# Patient Record
Sex: Male | Born: 1935 | Race: White | Hispanic: No | Smoking: Former smoker
Health system: Southern US, Community
[De-identification: ages and names within clinical notes are randomized; demographics above are authoritative.]

## PROBLEM LIST (undated history)

## (undated) DIAGNOSIS — G20 Parkinson's disease: Secondary | ICD-10-CM

## (undated) DIAGNOSIS — I2510 Atherosclerotic heart disease of native coronary artery without angina pectoris: Secondary | ICD-10-CM

## (undated) DIAGNOSIS — F0390 Unspecified dementia without behavioral disturbance: Secondary | ICD-10-CM

## (undated) DIAGNOSIS — G20A1 Parkinson's disease without dyskinesia, without mention of fluctuations: Secondary | ICD-10-CM

## (undated) DIAGNOSIS — Z955 Presence of coronary angioplasty implant and graft: Secondary | ICD-10-CM

## (undated) DIAGNOSIS — E119 Type 2 diabetes mellitus without complications: Secondary | ICD-10-CM

## (undated) DIAGNOSIS — F419 Anxiety disorder, unspecified: Secondary | ICD-10-CM

## (undated) DIAGNOSIS — C61 Malignant neoplasm of prostate: Secondary | ICD-10-CM

## (undated) DIAGNOSIS — I10 Essential (primary) hypertension: Secondary | ICD-10-CM

## (undated) DIAGNOSIS — E785 Hyperlipidemia, unspecified: Secondary | ICD-10-CM

## (undated) DIAGNOSIS — K219 Gastro-esophageal reflux disease without esophagitis: Secondary | ICD-10-CM

## (undated) HISTORY — PX: TRANSURETHRAL RESECTION OF PROSTATE: SHX73

## (undated) HISTORY — PX: APPENDECTOMY: SHX54

## (undated) HISTORY — PX: EYE SURGERY: SHX253

## (undated) HISTORY — PX: CORONARY STENT PLACEMENT: SHX1402

---

## 2011-02-15 DIAGNOSIS — C61 Malignant neoplasm of prostate: Secondary | ICD-10-CM | POA: Insufficient documentation

## 2016-10-02 ENCOUNTER — Emergency Department: Payer: Medicare Other

## 2016-10-02 ENCOUNTER — Encounter: Payer: Self-pay | Admitting: Emergency Medicine

## 2016-10-02 ENCOUNTER — Inpatient Hospital Stay
Admission: EM | Admit: 2016-10-02 | Discharge: 2016-10-08 | DRG: 871 | Disposition: A | Discharge status: Skilled Nursing Facility | Payer: Medicare Other | Attending: Internal Medicine | Admitting: Internal Medicine

## 2016-10-02 DIAGNOSIS — J189 Pneumonia, unspecified organism: Secondary | ICD-10-CM | POA: Diagnosis present

## 2016-10-02 DIAGNOSIS — R32 Unspecified urinary incontinence: Secondary | ICD-10-CM | POA: Diagnosis present

## 2016-10-02 DIAGNOSIS — Z794 Long term (current) use of insulin: Secondary | ICD-10-CM | POA: Diagnosis not present

## 2016-10-02 DIAGNOSIS — N400 Enlarged prostate without lower urinary tract symptoms: Secondary | ICD-10-CM | POA: Diagnosis present

## 2016-10-02 DIAGNOSIS — I4891 Unspecified atrial fibrillation: Secondary | ICD-10-CM | POA: Diagnosis not present

## 2016-10-02 DIAGNOSIS — G20 Parkinson's disease: Secondary | ICD-10-CM | POA: Diagnosis present

## 2016-10-02 DIAGNOSIS — E119 Type 2 diabetes mellitus without complications: Secondary | ICD-10-CM | POA: Diagnosis present

## 2016-10-02 DIAGNOSIS — L8991 Pressure ulcer of unspecified site, stage 1: Secondary | ICD-10-CM | POA: Diagnosis present

## 2016-10-02 DIAGNOSIS — R52 Pain, unspecified: Secondary | ICD-10-CM

## 2016-10-02 DIAGNOSIS — E785 Hyperlipidemia, unspecified: Secondary | ICD-10-CM | POA: Diagnosis present

## 2016-10-02 DIAGNOSIS — K219 Gastro-esophageal reflux disease without esophagitis: Secondary | ICD-10-CM | POA: Diagnosis present

## 2016-10-02 DIAGNOSIS — R451 Restlessness and agitation: Secondary | ICD-10-CM

## 2016-10-02 DIAGNOSIS — B370 Candidal stomatitis: Secondary | ICD-10-CM | POA: Diagnosis present

## 2016-10-02 DIAGNOSIS — Z79899 Other long term (current) drug therapy: Secondary | ICD-10-CM | POA: Diagnosis not present

## 2016-10-02 DIAGNOSIS — Z955 Presence of coronary angioplasty implant and graft: Secondary | ICD-10-CM

## 2016-10-02 DIAGNOSIS — L8990 Pressure ulcer of unspecified site, unspecified stage: Secondary | ICD-10-CM | POA: Insufficient documentation

## 2016-10-02 DIAGNOSIS — M542 Cervicalgia: Secondary | ICD-10-CM | POA: Diagnosis present

## 2016-10-02 DIAGNOSIS — Z9181 History of falling: Secondary | ICD-10-CM

## 2016-10-02 DIAGNOSIS — I2510 Atherosclerotic heart disease of native coronary artery without angina pectoris: Secondary | ICD-10-CM | POA: Diagnosis present

## 2016-10-02 DIAGNOSIS — J9600 Acute respiratory failure, unspecified whether with hypoxia or hypercapnia: Secondary | ICD-10-CM | POA: Diagnosis present

## 2016-10-02 DIAGNOSIS — Z809 Family history of malignant neoplasm, unspecified: Secondary | ICD-10-CM

## 2016-10-02 DIAGNOSIS — R296 Repeated falls: Secondary | ICD-10-CM | POA: Diagnosis present

## 2016-10-02 DIAGNOSIS — A419 Sepsis, unspecified organism: Principal | ICD-10-CM | POA: Diagnosis present

## 2016-10-02 DIAGNOSIS — F419 Anxiety disorder, unspecified: Secondary | ICD-10-CM | POA: Diagnosis present

## 2016-10-02 DIAGNOSIS — Z8249 Family history of ischemic heart disease and other diseases of the circulatory system: Secondary | ICD-10-CM

## 2016-10-02 DIAGNOSIS — F0281 Dementia in other diseases classified elsewhere with behavioral disturbance: Secondary | ICD-10-CM | POA: Diagnosis present

## 2016-10-02 DIAGNOSIS — R531 Weakness: Secondary | ICD-10-CM | POA: Diagnosis present

## 2016-10-02 DIAGNOSIS — M722 Plantar fascial fibromatosis: Secondary | ICD-10-CM | POA: Diagnosis present

## 2016-10-02 DIAGNOSIS — I10 Essential (primary) hypertension: Secondary | ICD-10-CM | POA: Diagnosis present

## 2016-10-02 HISTORY — DX: Essential (primary) hypertension: I10

## 2016-10-02 HISTORY — DX: Parkinson's disease without dyskinesia, without mention of fluctuations: G20.A1

## 2016-10-02 HISTORY — DX: Type 2 diabetes mellitus without complications: E11.9

## 2016-10-02 HISTORY — DX: Atherosclerotic heart disease of native coronary artery without angina pectoris: I25.10

## 2016-10-02 HISTORY — DX: Parkinson's disease: G20

## 2016-10-02 HISTORY — DX: Presence of coronary angioplasty implant and graft: Z95.5

## 2016-10-02 HISTORY — DX: Hyperlipidemia, unspecified: E78.5

## 2016-10-02 HISTORY — DX: Unspecified dementia, unspecified severity, without behavioral disturbance, psychotic disturbance, mood disturbance, and anxiety: F03.90

## 2016-10-02 HISTORY — DX: Gastro-esophageal reflux disease without esophagitis: K21.9

## 2016-10-02 HISTORY — DX: Anxiety disorder, unspecified: F41.9

## 2016-10-02 LAB — URINALYSIS, COMPLETE (UACMP) WITH MICROSCOPIC
BILIRUBIN URINE: NEGATIVE
Bacteria, UA: NONE SEEN
Ketones, ur: NEGATIVE mg/dL
Leukocytes, UA: NEGATIVE
NITRITE: NEGATIVE
PROTEIN: 100 mg/dL — AB
SPECIFIC GRAVITY, URINE: 1.024 (ref 1.005–1.030)
Squamous Epithelial / LPF: NONE SEEN
pH: 8 (ref 5.0–8.0)

## 2016-10-02 LAB — COMPREHENSIVE METABOLIC PANEL
ALT: 16 U/L — ABNORMAL LOW (ref 17–63)
AST: 41 U/L (ref 15–41)
Albumin: 3.6 g/dL (ref 3.5–5.0)
Alkaline Phosphatase: 100 U/L (ref 38–126)
Anion gap: 9 (ref 5–15)
BUN: 16 mg/dL (ref 6–20)
CO2: 29 mmol/L (ref 22–32)
Calcium: 9.2 mg/dL (ref 8.9–10.3)
Chloride: 99 mmol/L — ABNORMAL LOW (ref 101–111)
Creatinine, Ser: 1.12 mg/dL (ref 0.61–1.24)
GFR calc Af Amer: >60 mL/min (ref >60)
GFR calc non Af Amer: 60 mL/min — ABNORMAL LOW (ref >60)
Glucose, Bld: 346 mg/dL — ABNORMAL HIGH (ref 65–99)
Potassium: 3.8 mmol/L (ref 3.5–5.1)
Sodium: 137 mmol/L (ref 135–145)
Total Bilirubin: 0.8 mg/dL (ref 0.3–1.2)
Total Protein: 7.5 g/dL (ref 6.5–8.1)

## 2016-10-02 LAB — CBC WITH DIFFERENTIAL/PLATELET
BASOS PCT: 0 %
Basophils Absolute: 0 10*3/uL (ref 0–0.1)
EOS ABS: 0 10*3/uL (ref 0–0.7)
Eosinophils Relative: 0 %
HEMATOCRIT: 39.9 % — AB (ref 40.0–52.0)
HEMOGLOBIN: 13.5 g/dL (ref 13.0–18.0)
Lymphocytes Relative: 4 %
Lymphs Abs: 0.8 10*3/uL — ABNORMAL LOW (ref 1.0–3.6)
MCH: 28.7 pg (ref 26.0–34.0)
MCHC: 33.8 g/dL (ref 32.0–36.0)
MCV: 84.8 fL (ref 80.0–100.0)
Monocytes Absolute: 0.6 10*3/uL (ref 0.2–1.0)
Monocytes Relative: 3 %
NEUTROS ABS: 15.8 10*3/uL — AB (ref 1.4–6.5)
NEUTROS PCT: 93 %
Platelets: 245 10*3/uL (ref 150–440)
RBC: 4.7 MIL/uL (ref 4.40–5.90)
RDW: 15.2 % — ABNORMAL HIGH (ref 11.5–14.5)
WBC: 17.2 10*3/uL — AB (ref 3.8–10.6)

## 2016-10-02 LAB — INFLUENZA PANEL BY PCR (TYPE A & B)
INFLAPCR: NEGATIVE
Influenza B By PCR: NEGATIVE

## 2016-10-02 LAB — LACTIC ACID, PLASMA: Lactic Acid, Venous: 1.9 mmol/L (ref 0.5–1.9)

## 2016-10-02 MED ORDER — ONDANSETRON HCL 4 MG/2ML IJ SOLN: 4.0000 mg | Freq: Four times a day (QID) | INTRAMUSCULAR | Status: DC | PRN

## 2016-10-02 MED ORDER — SODIUM CHLORIDE 0.9 % IV BOLUS (SEPSIS)
1000.0000 mL | Freq: Once | INTRAVENOUS | Status: AC
  Administered 2016-10-02: 1000 mL via INTRAVENOUS

## 2016-10-02 MED ORDER — DEXTROSE 5 % IV SOLN
1.0000 g | INTRAVENOUS | Status: DC
  Administered 2016-10-03 – 2016-10-07 (×5): 1 g via INTRAVENOUS
  Filled 2016-10-02 (×6): qty 10

## 2016-10-02 MED ORDER — IPRATROPIUM-ALBUTEROL 0.5-2.5 (3) MG/3ML IN SOLN: 3.0000 mL | RESPIRATORY_TRACT | Status: DC | PRN

## 2016-10-02 MED ORDER — ACETAMINOPHEN 500 MG PO TABS
1000.0000 mg | ORAL_TABLET | Freq: Once | ORAL | Status: AC
  Administered 2016-10-02: 1000 mg via ORAL
  Filled 2016-10-02: qty 2

## 2016-10-02 MED ORDER — PANTOPRAZOLE SODIUM 40 MG PO TBEC
40.0000 mg | DELAYED_RELEASE_TABLET | Freq: Every day | ORAL | Status: DC
Start: 2016-10-03 — End: 2016-10-08
  Administered 2016-10-03 – 2016-10-08 (×6): 40 mg via ORAL
  Filled 2016-10-02 (×6): qty 1

## 2016-10-02 MED ORDER — BENZONATATE 100 MG PO CAPS: 200.0000 mg | ORAL_CAPSULE | Freq: Three times a day (TID) | ORAL | Status: DC | PRN

## 2016-10-02 MED ORDER — AMLODIPINE BESYLATE 5 MG PO TABS
5.0000 mg | ORAL_TABLET | Freq: Every day | ORAL | Status: DC
  Administered 2016-10-03 – 2016-10-07 (×5): 5 mg via ORAL
  Filled 2016-10-02 (×5): qty 1

## 2016-10-02 MED ORDER — CARBIDOPA-LEVODOPA 25-100 MG PO TABS
1.0000 | ORAL_TABLET | Freq: Three times a day (TID) | ORAL | Status: DC
  Administered 2016-10-03 – 2016-10-08 (×17): 1 via ORAL
  Filled 2016-10-02 (×17): qty 1

## 2016-10-02 MED ORDER — TAMSULOSIN HCL 0.4 MG PO CAPS
0.4000 mg | ORAL_CAPSULE | Freq: Every day | ORAL | Status: DC
  Administered 2016-10-03 – 2016-10-08 (×6): 0.4 mg via ORAL
  Filled 2016-10-02 (×6): qty 1

## 2016-10-02 MED ORDER — ONDANSETRON HCL 4 MG PO TABS: 4.0000 mg | ORAL_TABLET | Freq: Four times a day (QID) | ORAL | Status: DC | PRN

## 2016-10-02 MED ORDER — DEXTROSE 5 % IV SOLN
500.0000 mg | Freq: Once | INTRAVENOUS | Status: AC
  Administered 2016-10-02: 500 mg via INTRAVENOUS
  Filled 2016-10-02: qty 500

## 2016-10-02 MED ORDER — ENOXAPARIN SODIUM 40 MG/0.4ML ~~LOC~~ SOLN
40.0000 mg | SUBCUTANEOUS | Status: DC
  Administered 2016-10-03 – 2016-10-07 (×5): 40 mg via SUBCUTANEOUS
  Filled 2016-10-02 (×5): qty 0.4

## 2016-10-02 MED ORDER — DEXTROSE 5 % IV SOLN
500.0000 mg | INTRAVENOUS | Status: DC
  Administered 2016-10-03 – 2016-10-07 (×5): 500 mg via INTRAVENOUS
  Filled 2016-10-02 (×7): qty 500

## 2016-10-02 MED ORDER — ACETAMINOPHEN 325 MG PO TABS
650.0000 mg | ORAL_TABLET | Freq: Four times a day (QID) | ORAL | Status: DC | PRN
  Administered 2016-10-04 – 2016-10-07 (×3): 650 mg via ORAL
  Filled 2016-10-02 (×3): qty 2

## 2016-10-02 MED ORDER — CEFTRIAXONE SODIUM-DEXTROSE 1-3.74 GM-% IV SOLR
1.0000 g | Freq: Once | INTRAVENOUS | Status: AC
  Administered 2016-10-02: 1 g via INTRAVENOUS
  Filled 2016-10-02: qty 50

## 2016-10-02 MED ORDER — ACETAMINOPHEN 650 MG RE SUPP: 650.0000 mg | Freq: Four times a day (QID) | RECTAL | Status: DC | PRN

## 2016-10-02 NOTE — ED Provider Notes (Signed)
[facility] Emergency Department Provider Note  ____________________________________________  Time seen: Approximately 7:16 PM  I have reviewed the triage vital signs and the nursing notes.   HISTORY  Chief Complaint Altered Mental Status  Level 5 caveat:  Portions of the history and physical were unable to be obtained due to AMS   HPI Dennis Serrano is a 83 y.o. male history of Parkinson's disease, dementia, diabetes who presents for evaluation of altered mental status. According to the family patient is usually independent, walks and talks at baseline. Over the course of the last few days patient has been sleeping most of the day, decreased appetite, more confused than his baseline. Today he has become incontinent of urine. Patient tells me that he has had a cough but denies shortness of breath, chest pain, nausea, vomiting, diarrhea, abdominal pain.  Past Medical History:  Diagnosis Date  . Dementia   . Diabetes mellitus without complication ([place])   . History of heart artery stent   . Parkinson disease ([place])     There are no active problems to display for this patient.   History reviewed. No pertinent surgical history.  Prior to Admission medications   Medication Sig Start Date End Date Taking? Authorizing Provider  AmLODIPine Besylate (NORVASC PO) Take 1 tablet by mouth daily.   Yes Historical Provider, MD  carbidopa-levodopa (SINEMET IR) 25-100 MG tablet Take 1 tablet by mouth 3 (three) times daily.   Yes Historical Provider, MD  Cyclobenzaprine HCl (FLEXERIL PO) Take 1 tablet by mouth 3 (three) times daily as needed.   Yes Historical Provider, MD  GABAPENTIN PO Take 1 capsule by mouth.   Yes Historical Provider, MD  METFORMIN HCL PO Take 1 tablet by mouth.   Yes Historical Provider, MD  Omeprazole (PRILOSEC PO) Take by mouth.   Yes Historical Provider, MD  ondansetron (ZOFRAN) 4 MG tablet Take 4 mg by mouth every 8 (eight) hours as needed for  nausea or vomiting.   Yes Historical Provider, MD  SPIRONOLACTONE PO Take 1 tablet by mouth.   Yes Historical Provider, MD  Tamsulosin HCl (FLOMAX PO) Take 1 capsule by mouth daily.   Yes Historical Provider, MD    Allergies Patient has no known allergies.  History reviewed. No pertinent family history.  Social History Social History  Substance Use Topics  . Smoking status: Never Smoker  . Smokeless tobacco: Never Used  . Alcohol use No    Review of Systems  Constitutional: + fever, confused, somnolent Eyes: Negative for visual changes. ENT: Negative for sore throat. Neck: No neck pain  Cardiovascular: Negative for chest pain. Respiratory: Negative for shortness of breath. Gastrointestinal: Negative for abdominal pain, vomiting or diarrhea. + decreased Po intake Genitourinary: Negative for dysuria. + urinary incontinence Musculoskeletal: Negative for back pain. Skin: Negative for rash. Neurological: Negative for headaches, weakness or numbness. Psych: No SI or HI  ____________________________________________   PHYSICAL EXAM:  VITAL SIGNS: ED Triage Vitals  Enc Vitals Group     BP 10/02/16 1907 (!) 154/76     Pulse Rate 10/02/16 1907 (!) 110     Resp 10/02/16 1907 20     Temp 10/02/16 1907 (!) 102 F (38.9 C)     Temp Source 10/02/16 1907 Rectal     SpO2 10/02/16 1907 90 %     Weight 10/02/16 1908 152 lb 5.4 oz (69.1 kg)     Height 10/02/16 1908 5\' 7"  (1.702 m)     Head Circumference --  Peak Flow --      Pain Score --      Pain Loc --      Pain Edu? --      Excl. in [place]? --     Constitutional: Alert and oriented to self only, no distress, patient with soiled clothing, smelling strong urine, disheveled. HEENT:      Head: Normocephalic and atraumatic.         Eyes: Conjunctivae are normal. Sclera is non-icteric. EOMI. PERRL      Mouth/Throat: Mucous membranes are moist.       Neck: Supple with no signs of meningismus. Cardiovascular: Tachycardic with  regular rhythm. No murmurs, gallops, or rubs. 2+ symmetrical distal pulses are present in all extremities. No JVD. Respiratory: Normal respiratory effort. Lungs are clear to auscultation bilaterally. No wheezes, crackles, or rhonchi.  Gastrointestinal: Soft, non tender, and non distended with positive bowel sounds. No rebound or guarding. Musculoskeletal: Nontender with normal range of motion in all extremities. No edema, cyanosis, or erythema of extremities. Neurologic: Normal speech and language. Face is symmetric. Moving all extremities. No gross focal neurologic deficits are appreciated. Skin: Skin is warm, dry and intact. No rash noted.   ____________________________________________   LABS (all labs ordered are listed, but only abnormal results are displayed)  Labs Reviewed  COMPREHENSIVE METABOLIC PANEL - Abnormal; Notable for the following:       Result Value   Chloride 99 (*)    Glucose, Bld 346 (*)    ALT 16 (*)    GFR calc non Af Amer 60 (*)    All other components within normal limits  CBC WITH DIFFERENTIAL/PLATELET - Abnormal; Notable for the following:    WBC 17.2 (*)    HCT 39.9 (*)    RDW 15.2 (*)    Neutro Abs 15.8 (*)    Lymphs Abs 0.8 (*)    All other components within normal limits  URINALYSIS, COMPLETE (UACMP) WITH MICROSCOPIC - Abnormal; Notable for the following:    Color, Urine YELLOW (*)    APPearance CLEAR (*)    Glucose, UA >=500 (*)    Hgb urine dipstick SMALL (*)    Protein, ur 100 (*)    All other components within normal limits  CULTURE, BLOOD (ROUTINE X 2)  CULTURE, BLOOD (ROUTINE X 2)  URINE CULTURE  LACTIC ACID, PLASMA  LACTIC ACID, PLASMA  INFLUENZA PANEL BY PCR (TYPE A & B)   ____________________________________________  EKG  ED ECG REPORT I, Rudene Re, the attending physician, personally viewed and interpreted this ECG.  Sinus tachycardia, rate of 109, normal intervals, LAD, no ST elevations or depressions abnormal R-wave  progression. No prior for comparison. ____________________________________________  RADIOLOGY  CXR: Left retrocardiac opacity, suspicious for pneumonia. Recommend post-treatment followup PA and lateral chest X-ray in several weeks to ensure resolution  ____________________________________________   PROCEDURES  Procedure(s) performed: None Procedures Critical Care performed: yes  CRITICAL CARE Performed by: Rudene Re  ?  Total critical care time: 35 min  Critical care time was exclusive of separately billable procedures and treating other patients.  Critical care was necessary to treat or prevent imminent or life-threatening deterioration.  Critical care was time spent personally by me on the following activities: development of treatment plan with patient and/or surrogate as well as nursing, discussions with consultants, evaluation of patient's response to treatment, examination of patient, obtaining history from patient or surrogate, ordering and performing treatments and interventions, ordering and review of laboratory studies, ordering  and review of radiographic studies, pulse oximetry and re-evaluation of patient's condition.  ____________________________________________   INITIAL IMPRESSION / ASSESSMENT AND PLAN / ED COURSE  83 y.o. male history of Parkinson's disease, dementia, diabetes who presents for evaluation of altered mental status. Patient meets sepsis criteria upon arrival to the emergency room with a rectal temperature of 102F, tachycardia with heart rate of 110. Patient is normotensive. No meningeal signs. Patient is disheveled with soiled clothing smelling strong urine. We started an IV fluids and IV antibiotics. We'll do CBC, CMP, lactic acid, blood cultures, flu PCR, urinalysis, CXR.     _________________________ 8:23 PM on 10/02/2016 -----------------------------------------  Chest x-ray concerning for pneumonia. Patient was given ceftriaxone and  azithromycin. Will be admitted to the hospitalist service.  Pertinent labs & imaging results that were available during my care of the patient were reviewed by me and considered in my medical decision making (see chart for details).    ____________________________________________   FINAL CLINICAL IMPRESSION(S) / ED DIAGNOSES  Final diagnoses:  Sepsis, due to unspecified organism [facility])  Community acquired pneumonia, unspecified laterality      NEW MEDICATIONS STARTED DURING THIS VISIT:  New Prescriptions   No medications on file     Note:  This document was prepared using Dragon voice recognition software and may include unintentional dictation errors.    Rudene Re, MD 10/02/16 2023

## 2016-10-02 NOTE — ED Notes (Signed)
Pt's O2 sats noted at 88% on RA, Pt placed on 2L via [place]; will monitor O2 sats

## 2016-10-02 NOTE — ED Triage Notes (Signed)
Pt presents to ED via [facility] from home c/o AMS starting today. Pt's daughter states pt has dementia at baseline but is usually able to converse and able to walk around at home. Daughter states that the last few days all pt has done is sleep, confusion is increased from baseline, and pt has new-onset urinary incontinence. +wet sounding cough

## 2016-10-02 NOTE — Progress Notes (Signed)
Pharmacy Antibiotic Note  Dennis Serrano is a 83 y.o. male admitted on 10/02/2016 with pneumonia.  Pharmacy has been consulted for ceftriaxone dosing.  Plan: Ceftriaxone 1 gram q 24 hours ordered by MD. OK for pt parameters.  Height: 5\' 7"  (170.2 cm) Weight: 152 lb 5.4 oz (69.1 kg) IBW/kg (Calculated) : 66.1  Temp (24hrs), Avg:101.3 F (38.5 C), Min:100.5 F (38.1 C), Max:102 F (38.9 C)   Recent Labs Lab 10/02/16 1914  WBC 17.2*  CREATININE 1.12  LATICACIDVEN 1.9    Estimated Creatinine Clearance: 48.4 mL/min (by C-G formula based on SCr of 1.12 mg/dL).    No Known Allergies  Antimicrobials this admission: ceftriaxone azithromycin 4/8 >>    >>   Dose adjustments this admission:   Microbiology results: 4/8 BCx: pending 4/8 UCx: pending  4/8 Sputum: pending      4/8 UA: (-) Thank you for allowing pharmacy to be a part of this patient's care.  Keona Bilyeu S 10/02/2016 11:34 PM

## 2016-10-02 NOTE — ED Notes (Signed)
Pt transport to 217 

## 2016-10-02 NOTE — ED Notes (Signed)
Pt's O2 Sats noted to be above 94%; MD made aware

## 2016-10-02 NOTE — H&P (Signed)
[facility] [place] NAME: Dennis Serrano    MR#:  [id number]  DATE OF BIRTH:  [birth year]  DATE OF ADMISSION:  10/02/2016  PRIMARY CARE PHYSICIAN: Juline Patch, MD   REQUESTING/REFERRING PHYSICIAN: Alfred Levins, MD  CHIEF COMPLAINT:   Chief Complaint  Patient presents with  . Altered Mental Status    HISTORY OF PRESENT ILLNESS:  Dennis Serrano  is a 83 y.o. male who presents with Increased confusion, cough. Patient is unable to contribute significantly to his history, it is given by family at bedside. Here in the ED the patient was found to have pneumonia and met sepsis criteria. Hospitalists were called for admission.  PAST MEDICAL HISTORY:   Past Medical History:  Diagnosis Date  . Anxiety   . CAD (coronary artery disease)   . Dementia   . Diabetes mellitus without complication ([place])   . GERD (gastroesophageal reflux disease)   . History of heart artery stent   . HLD (hyperlipidemia)   . HTN (hypertension)   . Parkinson disease ([place])     PAST SURGICAL HISTORY:   Past Surgical History:  Procedure Laterality Date  . APPENDECTOMY    . CORONARY STENT PLACEMENT    . EYE SURGERY    . TRANSURETHRAL RESECTION OF PROSTATE      SOCIAL HISTORY:   Social History  Substance Use Topics  . Smoking status: Never Smoker  . Smokeless tobacco: Never Used  . Alcohol use No    FAMILY HISTORY:   Family History  Problem Relation Age of Onset  . Heart disease Mother   . Heart disease Father   . Cancer Sister   . Cancer Brother     DRUG ALLERGIES:  No Known Allergies  MEDICATIONS AT HOME:   Prior to Admission medications   Medication Sig Start Date End Date Taking? Authorizing Provider  AmLODIPine Besylate (NORVASC PO) Take 1 tablet by mouth daily.   Yes Historical Provider, MD  carbidopa-levodopa (SINEMET IR) 25-100 MG tablet Take 1 tablet by mouth 3 (three) times daily.   Yes Historical Provider, MD   Cyclobenzaprine HCl (FLEXERIL PO) Take 1 tablet by mouth 3 (three) times daily as needed.   Yes Historical Provider, MD  GABAPENTIN PO Take 1 capsule by mouth.   Yes Historical Provider, MD  METFORMIN HCL PO Take 1 tablet by mouth.   Yes Historical Provider, MD  Omeprazole (PRILOSEC PO) Take by mouth.   Yes Historical Provider, MD  ondansetron (ZOFRAN) 4 MG tablet Take 4 mg by mouth every 8 (eight) hours as needed for nausea or vomiting.   Yes Historical Provider, MD  SPIRONOLACTONE PO Take 1 tablet by mouth.   Yes Historical Provider, MD  Tamsulosin HCl (FLOMAX PO) Take 1 capsule by mouth daily.   Yes Historical Provider, MD    REVIEW OF SYSTEMS:  Review of Systems  Unable to perform ROS: Acuity of condition     VITAL SIGNS:   Vitals:   10/02/16 2026 10/02/16 2030 10/02/16 2040 10/02/16 2100  BP:  (!) 146/70  (!) 148/68  Pulse:  (!) 111  (!) 102  Resp:      Temp:      TempSrc:      SpO2: (!) 88% 91% 95% 95%  Weight:      Height:       Wt Readings from Last 3 Encounters:  10/02/16 69.1 kg (152 lb 5.4 oz)    PHYSICAL EXAMINATION:  Physical  Exam  Vitals reviewed. Constitutional: He appears well-developed and well-nourished. No distress.  HENT:  Head: Normocephalic and atraumatic.  Mouth/Throat: Oropharynx is clear and moist.  Eyes: Conjunctivae and EOM are normal. Pupils are equal, round, and reactive to light. No scleral icterus.  Neck: Normal range of motion. Neck supple. No JVD present. No thyromegaly present.  Cardiovascular: Normal rate, regular rhythm and intact distal pulses.  Exam reveals no gallop and no friction rub.   No murmur heard. Respiratory: Effort normal. No respiratory distress. He has no wheezes. He has no rales.  Rhonchi  GI: Soft. Bowel sounds are normal. He exhibits no distension. There is no tenderness.  Musculoskeletal: Normal range of motion. He exhibits no edema.  No arthritis, no gout  Lymphadenopathy:    He has no cervical adenopathy.   Neurological: He is alert. No cranial nerve deficit.  Unable to assess due to patient condition  Skin: Skin is warm and dry. No rash noted. No erythema.  Psychiatric:  Unable to assess due to patient condition    LABORATORY PANEL:   CBC  Recent Labs Lab 10/02/16 1914  WBC 17.2*  HGB 13.5  HCT 39.9*  PLT 245   ------------------------------------------------------------------------------------------------------------------  Chemistries   Recent Labs Lab 10/02/16 1914  NA 137  K 3.8  CL 99*  CO2 29  GLUCOSE 346*  BUN 16  CREATININE 1.12  CALCIUM 9.2  AST 41  ALT 16*  ALKPHOS 100  BILITOT 0.8   ------------------------------------------------------------------------------------------------------------------  Cardiac Enzymes No results for input(s): TROPONINI in the last 168 hours. ------------------------------------------------------------------------------------------------------------------  RADIOLOGY:  Dg Chest Port 1 View  Result Date: 10/02/2016 CLINICAL DATA:  Cough and weakness for several days.  Sepsis. EXAM: PORTABLE CHEST 1 VIEW COMPARISON:  None. FINDINGS: The heart size and mediastinal contours are within normal limits. Aortic atherosclerosis. Both lungs are clear. The visualized skeletal structures are unremarkable. Pulmonary opacity is seen in the left retrocardiac lung base, suspicious for pneumonia. Right lung is clear. IMPRESSION: Left retrocardiac opacity, suspicious for pneumonia. Recommend post-treatment followup PA and lateral chest X-ray in several weeks to ensure resolution. Electronically Signed   By: Myles Rosenthal M.D.   On: 10/02/2016 19:50    EKG:   Orders placed or performed during the hospital encounter of 10/02/16  . ED EKG 12-Lead  . ED EKG 12-Lead  . EKG 12-Lead  . EKG 12-Lead    IMPRESSION AND PLAN:  Principal Problem:   Sepsis (HCC) - broad spectrum IV antibiotics, lactic acid was within normal limits, patient is  hemodynamically stable, culture sent from the ED Active Problems:   CAP (community acquired pneumonia) - antibiotics and cultures as above   HTN (hypertension) - continue home meds   CAD (coronary artery disease) - continue home medications   HLD (hyperlipidemia) - home dose anti-lipids   GERD (gastroesophageal reflux disease) - home dose PPI  All the records are reviewed and case discussed with ED provider. Management plans discussed with the patient and/or family.  DVT PROPHYLAXIS: SubQ lovenox  GI PROPHYLAXIS: PPI  ADMISSION STATUS: Inpatient  CODE STATUS: Full Code Status History    This patient does not have a recorded code status. Please follow your organizational policy for patients in this situation.      TOTAL TIME TAKING CARE OF THIS PATIENT: 45 minutes.   Jerzy Roepke FIELDING 10/02/2016, 9:59 PM  [employer] [facility]  Office  [phone]  CC: Primary care physician; Thea Alken, MD  Note:  This document was  prepared using Systems analyst and may include unintentional dictation errors.

## 2016-10-02 NOTE — ED Notes (Signed)
Pt's O2 sats increased to 91% on 2L; O2 increased to 4L; will monitor O2 sats

## 2016-10-03 LAB — GLUCOSE, CAPILLARY
Glucose-Capillary: 274 mg/dL — ABNORMAL HIGH (ref 65–99)
Glucose-Capillary: 233 mg/dL — ABNORMAL HIGH (ref 65–99)

## 2016-10-03 LAB — BASIC METABOLIC PANEL
Anion gap: 10 (ref 5–15)
BUN: 15 mg/dL (ref 6–20)
CO2: 23 mmol/L (ref 22–32)
CREATININE: 0.75 mg/dL (ref 0.61–1.24)
Calcium: 8.7 mg/dL — ABNORMAL LOW (ref 8.9–10.3)
Chloride: 105 mmol/L (ref 101–111)
GFR calc Af Amer: >60 mL/min (ref >60)
GFR calc non Af Amer: >60 mL/min (ref >60)
Glucose, Bld: 377 mg/dL — ABNORMAL HIGH (ref 65–99)
Potassium: 3.8 mmol/L (ref 3.5–5.1)
SODIUM: 138 mmol/L (ref 135–145)

## 2016-10-03 LAB — CBC
HCT: 36.4 % — ABNORMAL LOW (ref 40.0–52.0)
Hemoglobin: 12.2 g/dL — ABNORMAL LOW (ref 13.0–18.0)
MCH: 28.2 pg (ref 26.0–34.0)
MCHC: 33.4 g/dL (ref 32.0–36.0)
MCV: 84.3 fL (ref 80.0–100.0)
PLATELETS: 219 10*3/uL (ref 150–440)
RBC: 4.31 MIL/uL — ABNORMAL LOW (ref 4.40–5.90)
RDW: 15.1 % — AB (ref 11.5–14.5)
WBC: 14.2 10*3/uL — AB (ref 3.8–10.6)

## 2016-10-03 MED ORDER — SODIUM CHLORIDE 0.9 % IV BOLUS (SEPSIS)
500.0000 mL | Freq: Once | INTRAVENOUS | Status: AC
  Administered 2016-10-03: 500 mL via INTRAVENOUS

## 2016-10-03 MED ORDER — INSULIN DETEMIR 100 UNIT/ML ~~LOC~~ SOLN
20.0000 [IU] | Freq: Every day | SUBCUTANEOUS | Status: DC
  Filled 2016-10-03: qty 0.2

## 2016-10-03 MED ORDER — ASPIRIN EC 81 MG PO TBEC
81.0000 mg | DELAYED_RELEASE_TABLET | Freq: Every day | ORAL | Status: DC
  Administered 2016-10-04 – 2016-10-07 (×4): 81 mg via ORAL
  Filled 2016-10-03 (×4): qty 1

## 2016-10-03 MED ORDER — SODIUM CHLORIDE 0.9 % IV SOLN
INTRAVENOUS | Status: DC
  Administered 2016-10-03 – 2016-10-04 (×3): via INTRAVENOUS

## 2016-10-03 MED ORDER — INSULIN NPH (HUMAN) (ISOPHANE) 100 UNIT/ML ~~LOC~~ SUSP: 20.0000 [IU] | Freq: Every day | SUBCUTANEOUS | Status: DC

## 2016-10-03 MED ORDER — INSULIN ASPART 100 UNIT/ML ~~LOC~~ SOLN
0.0000 [IU] | Freq: Every day | SUBCUTANEOUS | Status: DC
Start: 2016-10-03 — End: 2016-10-08
  Administered 2016-10-03 – 2016-10-04 (×2): 2 [IU] via SUBCUTANEOUS
  Administered 2016-10-05: 3 [IU] via SUBCUTANEOUS
  Administered 2016-10-06: 4 [IU] via SUBCUTANEOUS
  Filled 2016-10-03: qty 2
  Filled 2016-10-03: qty 4
  Filled 2016-10-03: qty 3
  Filled 2016-10-03: qty 2

## 2016-10-03 MED ORDER — HALOPERIDOL LACTATE 5 MG/ML IJ SOLN
2.0000 mg | Freq: Once | INTRAMUSCULAR | Status: AC
  Administered 2016-10-03: 2 mg via INTRAVENOUS
  Filled 2016-10-03: qty 1

## 2016-10-03 MED ORDER — INSULIN DETEMIR 100 UNIT/ML ~~LOC~~ SOLN
18.0000 [IU] | Freq: Every day | SUBCUTANEOUS | Status: DC
  Administered 2016-10-03: 18 [IU] via SUBCUTANEOUS
  Filled 2016-10-03 (×2): qty 0.18

## 2016-10-03 MED ORDER — QUETIAPINE FUMARATE 25 MG PO TABS
25.0000 mg | ORAL_TABLET | Freq: Every day | ORAL | Status: DC
  Administered 2016-10-03 – 2016-10-07 (×5): 25 mg via ORAL
  Filled 2016-10-03 (×5): qty 1

## 2016-10-03 MED ORDER — DIAZEPAM 2 MG PO TABS
2.0000 mg | ORAL_TABLET | Freq: Every evening | ORAL | Status: DC | PRN
  Administered 2016-10-04 – 2016-10-06 (×3): 2 mg via ORAL
  Filled 2016-10-03 (×3): qty 1

## 2016-10-03 MED ORDER — INSULIN NPH (HUMAN) (ISOPHANE) 100 UNIT/ML ~~LOC~~ SUSP: 18.0000 [IU] | Freq: Every day | SUBCUTANEOUS | Status: DC

## 2016-10-03 MED ORDER — NYSTATIN 100000 UNIT/ML MT SUSP
5.0000 mL | Freq: Four times a day (QID) | OROMUCOSAL | Status: DC
  Administered 2016-10-03 – 2016-10-08 (×19): 500000 [IU] via ORAL
  Filled 2016-10-03 (×19): qty 5

## 2016-10-03 MED ORDER — INSULIN ASPART 100 UNIT/ML ~~LOC~~ SOLN
0.0000 [IU] | Freq: Three times a day (TID) | SUBCUTANEOUS | Status: DC
  Administered 2016-10-03 – 2016-10-04 (×2): 5 [IU] via SUBCUTANEOUS
  Administered 2016-10-04 – 2016-10-05 (×2): 1 [IU] via SUBCUTANEOUS
  Administered 2016-10-05: 2 [IU] via SUBCUTANEOUS
  Administered 2016-10-05: 3 [IU] via SUBCUTANEOUS
  Administered 2016-10-06: 2 [IU] via SUBCUTANEOUS
  Administered 2016-10-06: 7 [IU] via SUBCUTANEOUS
  Administered 2016-10-06: 2 [IU] via SUBCUTANEOUS
  Administered 2016-10-07: 5 [IU] via SUBCUTANEOUS
  Administered 2016-10-07: 2 [IU] via SUBCUTANEOUS
  Administered 2016-10-07 – 2016-10-08 (×3): 1 [IU] via SUBCUTANEOUS
  Filled 2016-10-03: qty 1
  Filled 2016-10-03: qty 5
  Filled 2016-10-03: qty 3
  Filled 2016-10-03 (×2): qty 2
  Filled 2016-10-03: qty 5
  Filled 2016-10-03 (×2): qty 1
  Filled 2016-10-03: qty 2
  Filled 2016-10-03: qty 5
  Filled 2016-10-03: qty 1
  Filled 2016-10-03: qty 7
  Filled 2016-10-03: qty 2
  Filled 2016-10-03: qty 1

## 2016-10-03 MED ORDER — LORATADINE 10 MG PO TABS
10.0000 mg | ORAL_TABLET | Freq: Every day | ORAL | Status: DC
  Administered 2016-10-03 – 2016-10-08 (×6): 10 mg via ORAL
  Filled 2016-10-03 (×6): qty 1

## 2016-10-03 NOTE — Progress Notes (Signed)
Patient ID: Dennis Serrano, male   DOB: Jul 20, 1932, 83 y.o.   MRN: [id number]  [employer] [facility] PROGRESS NOTE  Dennis Serrano UEK:[id number] DOB: 08/09/1932 DOA: 10/02/2016 PCP: Dennis Patch, MD  HPI/Subjective: Patient very fidgety in the bed. Moving around and pulling off the covers. Patient does answer some yes or no questions. As per the wife, he falls a lot.  Objective: Vitals:   10/03/16 0956 10/03/16 1309  BP: (!) 157/74 (!) 151/79  Pulse: 96 (!) 105  Resp:  20  Temp:  98.1 F (36.7 C)    Filed Weights   10/02/16 1908 10/02/16 2334  Weight: 69.1 kg (152 lb 5.4 oz) 70.1 kg (154 lb 9.6 oz)    ROS: Review of Systems  Unable to perform ROS: Dementia  Respiratory: Positive for cough and shortness of breath.   Cardiovascular: Negative for chest pain.  Gastrointestinal: Negative for abdominal pain.   Exam: Physical Exam  HENT:  Nose: No mucosal edema.  Mouth/Throat: No oropharyngeal exudate.  Thrush seen on the tongue  Eyes: Conjunctivae and lids are normal. Pupils are equal, round, and reactive to light.  Neck: Carotid bruit is not present. No thyromegaly present.  Cardiovascular: S1 normal, S2 normal and normal heart sounds.   Respiratory: He has no decreased breath sounds. He has no wheezes. He has no rhonchi. He has no rales.  GI: Soft. Bowel sounds are normal. There is no tenderness.  Musculoskeletal:       Right ankle: He exhibits no swelling.       Left ankle: He exhibits no swelling.  Neurological: He is alert.  Patient moves all of his extremities on his own. Very agitated in the bed. But does answer questions.  Skin: Skin is warm. Nails show no clubbing.  Scabs seen on legs bilaterally.  Psychiatric: He is agitated.      Data Reviewed: Basic Metabolic Panel:  Recent Labs Lab 10/02/16 1914 10/03/16 0513  NA 137 138  K 3.8 3.8  CL 99* 105  CO2 29 23  GLUCOSE 346* 377*  BUN 16 15  CREATININE 1.12 0.75  CALCIUM 9.2 8.7*   Liver Function  Tests:  Recent Labs Lab 10/02/16 1914  AST 41  ALT 16*  ALKPHOS 100  BILITOT 0.8  PROT 7.5  ALBUMIN 3.6   CBC:  Recent Labs Lab 10/02/16 1914 10/03/16 0513  WBC 17.2* 14.2*  NEUTROABS 15.8*  --   HGB 13.5 12.2*  HCT 39.9* 36.4*  MCV 84.8 84.3  PLT 245 219     Recent Results (from the past 240 hour(s))  Blood Culture (routine x 2)     Status: None (Preliminary result)   Collection Time: 10/02/16  7:14 PM  Result Value Ref Range Status   Specimen Description BLOOD LEFT ANTECUBITAL  Final   Special Requests   Final    BOTTLES DRAWN AEROBIC AND ANAEROBIC Blood Culture adequate volume   Culture NO GROWTH < 12 HOURS  Final   Report Status PENDING  Incomplete  Blood Culture (routine x 2)     Status: None (Preliminary result)   Collection Time: 10/02/16  7:14 PM  Result Value Ref Range Status   Specimen Description BLOOD LEFT FOREARM  Final   Special Requests   Final    BOTTLES DRAWN AEROBIC AND ANAEROBIC Blood Culture adequate volume   Culture NO GROWTH < 12 HOURS  Final   Report Status PENDING  Incomplete     Studies: Dg Chest Port 1 [address]  Result Date: 10/02/2016 CLINICAL DATA:  Cough and weakness for several days.  Sepsis. EXAM: PORTABLE CHEST 1 VIEW COMPARISON:  None. FINDINGS: The heart size and mediastinal contours are within normal limits. Aortic atherosclerosis. Both lungs are clear. The visualized skeletal structures are unremarkable. Pulmonary opacity is seen in the left retrocardiac lung base, suspicious for pneumonia. Right lung is clear. IMPRESSION: Left retrocardiac opacity, suspicious for pneumonia. Recommend post-treatment followup PA and lateral chest X-ray in several weeks to ensure resolution. Electronically Signed   By: Earle Gell M.D.   On: 10/02/2016 19:50    Scheduled Meds: . amLODipine  5 mg Oral Daily  . [START ON 10/04/2016] aspirin EC  81 mg Oral Daily  . azithromycin  500 mg Intravenous Q24H  . carbidopa-levodopa  1 tablet Oral TID  .  cefTRIAXone (ROCEPHIN)  IV  1 g Intravenous Q24H  . enoxaparin (LOVENOX) injection  40 mg Subcutaneous Q24H  . insulin aspart  0-5 Units Subcutaneous QHS  . insulin aspart  0-9 Units Subcutaneous TID WC  . insulin detemir  18 Units Subcutaneous QHS  . [START ON 10/04/2016] insulin detemir  20 Units Subcutaneous QAC breakfast  . loratadine  10 mg Oral Daily  . nystatin  5 mL Oral QID  . pantoprazole  40 mg Oral Daily  . QUEtiapine  25 mg Oral QHS  . tamsulosin  0.4 mg Oral Daily   Continuous Infusions: . sodium chloride 40 mL/hr at 10/03/16 1514    Assessment/Plan:  1. Clinical sepsis secondary to pneumonia. On Rocephin and Zithromax. Decrease rate of IV fluid hydration. 2. Dementia with behavioral disturbance. Agitated in bed. Seroquel at night to sleep. Haldol is not a good medication for people with Parkinson's 3. Essential hypertension on Norvasc 4. Type 2 diabetes mellitus on sliding scale and detemir insulin. 5. Thrush on nystatin 6. BPH on Flomax 7. Parkinson's disease on Sinemet 8. Hyperlipidemia unspecified okay holding statin at this point 9. GERD on PPI 10. History of coronary artery disease 11. Weakness and falls. Physical therapy evaluation  Code Status:     Code Status Orders        Start     Ordered   10/02/16 2325  Full code  Continuous     10/02/16 2324    Code Status History    Date Active Date Inactive Code Status Order ID Comments User Context   This patient has a current code status but no historical code status.    Advance Directive Documentation     Most Recent Value  Type of Advance Directive  Healthcare Power of Attorney  Pre-existing out of facility DNR order (yellow form or pink MOST form)  -  "MOST" Form in Place?  -     Family Communication: Spoke with wife and daughter at the bedside Disposition Plan: To be determined based on physical therapy evaluation  Antibiotics:  Rocephin  Zithromax  Time spent: 28 minutes  Loletha Grayer  [employer]

## 2016-10-03 NOTE — Progress Notes (Signed)
Inpatient Diabetes Program Recommendations  AACE/ADA: New Consensus Statement on Inpatient Glycemic Control (2015)  Target Ranges:  Prepandial:   less than 140 mg/dL      Peak postprandial:   less than 180 mg/dL (1-2 hours)      Critically ill patients:  140 - 180 mg/dL   No results found for: GLUCAP, HGBA1C  Review of Glycemic Control:  Results for Dennis Serrano, Dennis Serrano (MRN [id number]) as of 10/03/2016 12:24  Ref. Range 10/02/2016 19:14 10/03/2016 05:13  Glucose Latest Ref Range: 65 - 99 mg/dL 346 (H) 377 (H)    Diabetes history: Type 2 diabetes Outpatient Diabetes medications: NPH 20 units AM and 18 units PM, Metformin 1000 mg bid Current orders for Inpatient glycemic control:  Novolog sensitive tid with meals and HS- Just added  Inpatient Diabetes Program Recommendations:    Consider adding Levemir 12 units bid and Novolog meal coverage 4 units tid with meals.   Thanks, Adah Perl, RN, BC-ADM Inpatient Diabetes Coordinator Pager [phone]

## 2016-10-03 NOTE — Evaluation (Signed)
Physical Therapy Evaluation Patient Details Name: Dennis Serrano MRN: [id number] DOB: [birth year] Today's Date: 10/03/2016   History of Present Illness  Pt is an 83 y.o. male presenting to hospital with AMS, increased sleeping, decreased appetite, and cough.  Pt admitted with sepsis and community acquired PNA.  PMH includes Parkinson's disease, dementia, DM, and CAD.  Clinical Impression  Prior to hospital admission, pt was ambulatory (often using SPC).  Pt lives with his wife.  Currently pt is 2 assist with bed mobility, transfers, and walking a few feet bed to chair with RW.  Pt impulsive and grabbing for various things during functional mobility requiring redirection, tactile cues, and hand over hand guidance.  Pt would benefit from skilled PT to address noted impairments and functional limitations.  Recommend pt discharge to STR when medically appropriate.    Follow Up Recommendations SNF    Equipment Recommendations  Rolling walker with 5" wheels    Recommendations for Other Services       Precautions / Restrictions Precautions Precautions: Fall Restrictions Weight Bearing Restrictions: No      Mobility  Bed Mobility Overal bed mobility: Needs Assistance Bed Mobility: Supine to Sit     Supine to sit: Min assist;Mod assist;+2 for physical assistance;HOB elevated     General bed mobility comments: assist for trunk and B LE's; increased time required d/t pt grabbing for objects (including therapists clothes and nametag); hand over hand assist required  Transfers Overall transfer level: Needs assistance Equipment used: Rolling walker (2 wheeled) Transfers: Sit to/from Stand Sit to Stand: Min assist;Mod assist;+2 physical assistance         General transfer comment: hand over hand placement to hold onto walker to stand (so pt wouldn't grab onto anything); assist to initiate stand  Ambulation/Gait Ambulation/Gait assistance: Min assist;Mod assist;+2 physical  assistance Ambulation Distance (Feet): 3 Feet (bed to chair) Assistive device: Rolling walker (2 wheeled)   Gait velocity: decreased   General Gait Details: slow cadence; consistent vc's for navigation and safety; increased time to perform  Stairs            Wheelchair Mobility    Modified Rankin (Stroke Patients Only)       Balance Overall balance assessment: Needs assistance;History of Falls Sitting-balance support: Bilateral upper extremity supported;Feet supported Sitting balance-Leahy Scale: Fair Sitting balance - Comments: static sitting   Standing balance support: Bilateral upper extremity supported (on RW) Standing balance-Leahy Scale: Fair Standing balance comment: static standing                             Pertinent Vitals/Pain Pain Assessment: Faces Faces Pain Scale: No hurt  Vitals (HR and O2 on 4 L via nasal cannula) stable and WFL throughout treatment session.    Home Living Family/patient expects to be discharged to:: Private residence Living Arrangements: Spouse/significant other;Children (Pt's wife and son) Available Help at Discharge: Family           Home Equipment: Kasandra Knudsen - single point      Prior Function Level of Independence: Needs assistance   Gait / Transfers Assistance Needed: Ambulates with SPC if he remembers it      Comments: Pt's grandson reports that pt and pt's wife are both having difficulties "getting around"; also reports pt has regular falls (referencing bruises and abrasions on pt's legs/arms)     Hand Dominance        Extremity/Trunk Assessment   Upper Extremity Assessment Upper  Extremity Assessment: Difficult to assess due to impaired cognition    Lower Extremity Assessment Lower Extremity Assessment: Difficult to assess due to impaired cognition       Communication   Communication: No difficulties  Cognition Arousal/Alertness: Awake/alert Behavior During Therapy: Impulsive Overall  Cognitive Status: History of cognitive impairments - at baseline (Oriented to name only (pt's grandson reports this is his baseline "lately"))                                        General Comments General comments (skin integrity, edema, etc.): Multiple bruises and abrasions noted B LE's.  Nursing cleared pt for participation in physical therapy.  Pt and pt's grandson agreeable to PT session.    Exercises  Functional mobility   Assessment/Plan    PT Assessment Patient needs continued PT services  PT Problem List Decreased strength;Decreased activity tolerance;Decreased balance;Decreased mobility;Decreased knowledge of use of DME;Decreased knowledge of precautions       PT Treatment Interventions DME instruction;Gait training;Stair training;Functional mobility training;Therapeutic activities;Therapeutic exercise;Balance training;Patient/family education    PT Goals (Current goals can be found in the Care Plan section)  Acute Rehab PT Goals Patient Stated Goal: to be able to walk more PT Goal Formulation: With patient Time For Goal Achievement: 10/17/16 Potential to Achieve Goals: Fair    Frequency Min 2X/week   Barriers to discharge Decreased caregiver support Level of assist    Co-evaluation               End of Session Equipment Utilized During Treatment: Gait belt;Oxygen (4 L via nasal cannula) Activity Tolerance: Patient tolerated treatment well Patient left: in chair;with call bell/phone within reach;with chair alarm set;with nursing/sitter in room;with family/visitor present Nurse Communication: Mobility status;Precautions PT Visit Diagnosis: Unsteadiness on feet (R26.81);History of falling (Z91.81);Repeated falls (R29.6);Muscle weakness (generalized) (M62.81);Difficulty in walking, not elsewhere classified (R26.2)    Time: 9702-6378 PT Time Calculation (min) (ACUTE ONLY): 29 min   Charges:   PT Evaluation $PT Eval Low Complexity: 1  Procedure PT Treatments $Therapeutic Activity: 8-22 mins   PT G CodesLeitha Bleak, PT 10/03/16, 5:18 PM [phone]

## 2016-10-03 NOTE — Clinical Social Work Note (Signed)
CSW stopped by to speak with patient's daughter, [name], this afternoon however, [name] was no longer there and her son, patient's grandson was there. Patient's grandson stated that his mom, [name], had gone home to sleep because she works 3rd shift here at [employer] explained to patient's grandson that I would try to catch up with her in the morning as I did not want to awaken her. I let him know that I am waiting on the PT assessment to indicate which level of care would be most appropriate for patient. Shela Leff MSW,LCSW [phone]

## 2016-10-03 NOTE — Progress Notes (Signed)
Notified Md of increased agitation, sats 90% on 5L, HR 120's, orders taken

## 2016-10-04 ENCOUNTER — Inpatient Hospital Stay
Admit: 2016-10-04 | Discharge: 2016-10-04 | Disposition: A | Discharge status: Home / Self Care | Payer: Medicare Other | Attending: Internal Medicine | Admitting: Internal Medicine

## 2016-10-04 LAB — GLUCOSE, CAPILLARY
GLUCOSE-CAPILLARY: 304 mg/dL — AB (ref 65–99)
Glucose-Capillary: 83 mg/dL (ref 65–99)
Glucose-Capillary: 122 mg/dL — ABNORMAL HIGH (ref 65–99)
Glucose-Capillary: 256 mg/dL — ABNORMAL HIGH (ref 65–99)
Glucose-Capillary: 248 mg/dL — ABNORMAL HIGH (ref 65–99)

## 2016-10-04 LAB — EXPECTORATED SPUTUM ASSESSMENT W REFEX TO RESP CULTURE

## 2016-10-04 LAB — URINE CULTURE: Culture: <10000 — AB

## 2016-10-04 LAB — EXPECTORATED SPUTUM ASSESSMENT W GRAM STAIN, RFLX TO RESP C

## 2016-10-04 MED ORDER — DILTIAZEM HCL 30 MG PO TABS
30.0000 mg | ORAL_TABLET | Freq: Once | ORAL | Status: AC
  Administered 2016-10-04: 30 mg via ORAL
  Filled 2016-10-04: qty 1

## 2016-10-04 MED ORDER — DILTIAZEM HCL 60 MG PO TABS
60.0000 mg | ORAL_TABLET | Freq: Four times a day (QID) | ORAL | Status: DC | PRN
  Filled 2016-10-04: qty 1

## 2016-10-04 MED ORDER — INSULIN DETEMIR 100 UNIT/ML ~~LOC~~ SOLN
12.0000 [IU] | Freq: Every day | SUBCUTANEOUS | Status: DC
  Administered 2016-10-04 – 2016-10-07 (×4): 12 [IU] via SUBCUTANEOUS
  Filled 2016-10-04 (×5): qty 0.12

## 2016-10-04 MED ORDER — DILTIAZEM HCL 30 MG PO TABS
30.0000 mg | ORAL_TABLET | Freq: Four times a day (QID) | ORAL | Status: DC | PRN
Start: 2016-10-04 — End: 2016-10-04
  Administered 2016-10-04: 30 mg via ORAL
  Filled 2016-10-04 (×2): qty 1

## 2016-10-04 MED ORDER — INSULIN DETEMIR 100 UNIT/ML ~~LOC~~ SOLN
12.0000 [IU] | Freq: Every day | SUBCUTANEOUS | Status: DC
  Administered 2016-10-05 – 2016-10-08 (×5): 12 [IU] via SUBCUTANEOUS
  Filled 2016-10-04 (×4): qty 0.12

## 2016-10-04 NOTE — Progress Notes (Addendum)
Notified Dr. Vianne Bulls of HR increase to 136 or 137 notified by tele that it looked like RVR. Per MD okay to place order for cardizem oral q 6prn for HR greater than 100. Pt sleeping comfortably in room. Will continue to monitor.

## 2016-10-04 NOTE — Progress Notes (Signed)
Pt transferred over to 2A at this time.

## 2016-10-04 NOTE — Progress Notes (Signed)
Patient ID: Dennis Serrano, male   DOB: 07/29/1932, 83 y.o.   MRN: [id number]  [facility] PROGRESS NOTE  Dennis Serrano AYT:[id number] DOB: Nov 14, 1932 DOA: 10/02/2016 PCP: Juline Patch, MD  HPI/Subjective: Patient  Is seen at bedside. Has cough but no  SOB,developed  afib with RVR.moved to tele for close monitoring.echo ordered.no chest pain.started on po cardizem,  Objective: Vitals:   10/04/16 1838 10/04/16 1844  BP: (!) 114/59   Pulse: 99 (!) 118  Resp: 20   Temp: 99.9 F (37.7 C)     Filed Weights   10/02/16 1908 10/02/16 2334  Weight: 69.1 kg (152 lb 5.4 oz) 70.1 kg (154 lb 9.6 oz)    ROS: Review of Systems  Unable to perform ROS: Dementia  Respiratory: Positive for cough and shortness of breath.   Cardiovascular: Negative for chest pain.  Gastrointestinal: Negative for abdominal pain.   Exam: Physical Exam  HENT:  Nose: No mucosal edema.  Mouth/Throat: No oropharyngeal exudate.  Thrush seen on the tongue  Eyes: Conjunctivae and lids are normal. Pupils are equal, round, and reactive to light.  Neck: Carotid bruit is not present. No thyromegaly present.  Cardiovascular: S1 normal, S2 normal and normal heart sounds.   Respiratory: He has no decreased breath sounds. He has no wheezes. He has no rhonchi. He has no rales.  GI: Soft. Bowel sounds are normal. There is no tenderness.  Musculoskeletal:       Right ankle: He exhibits no swelling.       Left ankle: He exhibits no swelling.  Neurological: He is alert.  Patient moves all of his extremities on his own. Very agitated in the bed. But does answer questions.  Skin: Skin is warm. Nails show no clubbing.  Scabs seen on legs bilaterally.  Psychiatric: He is agitated.      Data Reviewed: Basic Metabolic Panel:  Recent Labs Lab 10/02/16 1914 10/03/16 0513  NA 137 138  K 3.8 3.8  CL 99* 105  CO2 29 23  GLUCOSE 346* 377*  BUN 16 15  CREATININE 1.12 0.75  CALCIUM 9.2 8.7*   Liver Function  Tests:  Recent Labs Lab 10/02/16 1914  AST 41  ALT 16*  ALKPHOS 100  BILITOT 0.8  PROT 7.5  ALBUMIN 3.6   CBC:  Recent Labs Lab 10/02/16 1914 10/03/16 0513  WBC 17.2* 14.2*  NEUTROABS 15.8*  --   HGB 13.5 12.2*  HCT 39.9* 36.4*  MCV 84.8 84.3  PLT 245 219     Recent Results (from the past 240 hour(s))  Blood Culture (routine x 2)     Status: None (Preliminary result)   Collection Time: 10/02/16  7:14 PM  Result Value Ref Range Status   Specimen Description BLOOD LEFT ANTECUBITAL  Final   Special Requests   Final    BOTTLES DRAWN AEROBIC AND ANAEROBIC Blood Culture adequate volume   Culture NO GROWTH 2 DAYS  Final   Report Status PENDING  Incomplete  Blood Culture (routine x 2)     Status: None (Preliminary result)   Collection Time: 10/02/16  7:14 PM  Result Value Ref Range Status   Specimen Description BLOOD LEFT FOREARM  Final   Special Requests   Final    BOTTLES DRAWN AEROBIC AND ANAEROBIC Blood Culture adequate volume   Culture NO GROWTH 2 DAYS  Final   Report Status PENDING  Incomplete  Urine culture     Status: Abnormal   Collection Time: 10/03/16 12:25 AM  Result Value Ref Range Status   Specimen Description URINE, RANDOM  Final   Special Requests NONE  Final   Culture (A)  Final    <10,000 COLONIES/mL INSIGNIFICANT GROWTH Performed at [facility], [place] [address], [place], [place] [address]    Report Status 10/04/2016 FINAL  Final     Studies: No results found.  Scheduled Meds: . amLODipine  5 mg Oral Daily  . aspirin EC  81 mg Oral Daily  . azithromycin  500 mg Intravenous Q24H  . carbidopa-levodopa  1 tablet Oral TID  . cefTRIAXone (ROCEPHIN)  IV  1 g Intravenous Q24H  . enoxaparin (LOVENOX) injection  40 mg Subcutaneous Q24H  . insulin aspart  0-5 Units Subcutaneous QHS  . insulin aspart  0-9 Units Subcutaneous TID WC  . insulin detemir  12 Units Subcutaneous QHS  . [START ON 10/05/2016] insulin detemir  12 Units Subcutaneous QAC  breakfast  . loratadine  10 mg Oral Daily  . nystatin  5 mL Oral QID  . pantoprazole  40 mg Oral Daily  . QUEtiapine  25 mg Oral QHS  . tamsulosin  0.4 mg Oral Daily   Continuous Infusions: . sodium chloride 40 mL/hr at 10/04/16 1731    Assessment/Plan:  1. Clinical sepsis secondary to pneumonia. On Rocephin and Zithromax.continue  2. Dementia with behavioral disturbance. Agitated in bed. Seroquel at night to sleep.  3. Essential hypertension on Norvasc 4. Type 2 diabetes mellitus ;low fasting BG..adjusted lantus. 5. Thrush on nystatin 6. BPH on Flomax 7. Parkinson's disease on Sinemet 8. Hyperlipidemia unspecified okay holding statin at this point 9. GERD on PPI 10. History of coronary artery disease 11. Weakness and falls. Physical therapy recommended SNF 12. New onset afib with RVR;on Cardizem po,if HR persistently high more than 100,can start Cardizem drip.ordered echo.not a candidate for full anticoagulation due to dementia.afib likely due to Pneumonia  Code Status:     Code Status Orders        Start     Ordered   10/02/16 2325  Full code  Continuous     10/02/16 2324    Code Status History    Date Active Date Inactive Code Status Order ID Comments User Context   This patient has a current code status but no historical code status.    Advance Directive Documentation     Most Recent Value  Type of Advance Directive  Healthcare Power of Attorney  Pre-existing out of facility DNR order (yellow form or pink MOST form)  -  "MOST" Form in Place?  -     Family Communication: Spoke with wife  at the bedside Disposition Plan: SNF  Antibiotics:  Rocephin  Zithromax  Time spent: 28 minutes  [facility]

## 2016-10-04 NOTE — Clinical Social Work Note (Signed)
Clinical Social Work Assessment  Patient Details  Name: Dennis Serrano MRN: [id number] Date of Birth: 1933-02-05  Date of referral:  10/04/16               Reason for consult:  Facility Placement                Permission sought to share information with:   (patient with advanced dementia) Permission granted to share information::     Name::        Agency::     Relationship::     Contact Information:     Housing/Transportation Living arrangements for the past 2 months:  Single Family Home Source of Information:  Adult Children Patient Interpreter Needed:  None Criminal Activity/Legal Involvement Pertinent to Current Situation/Hospitalization:  No - Comment as needed Significant Relationships:  Adult Children, Spouse Lives with:  Adult Children Do you feel safe going back to the place where you live?  Yes Need for family participation in patient care:  Yes (Comment)  Care giving concerns:  Patient resides with his spouse and his son lives in the home.   Social Worker assessment / plan:  CSW noted that PT made the recommendation for STR. CSW spoke with patient's daughter, Dennis Serrano, this afternoon. CSW explained role and purpose of visit. Patient's daughter is a Marine scientist on ortho on 3rd shift here at the hospital. Mrs. Oran Rein stated that she would be open to patient going to STR but would not want him to go to [facility]. CSW explained the bed search process. Mrs. Oran Rein stated that her brother lives with their parents and assists but states she has not been pleased with the care. Mrs. Oran Rein stated that she has HPOA and would be bringing it to the hospital. CSW explained that without the HPOA verification, the decision for a discharge disposition would go to patient's spouse and then if she was unable to make decisions, the patient's children would then have to come to a consensus. Mrs. Oran Rein verbalized understanding and stated she would bring the HPOA. CSW to initiate a bed  search.  Employment status:  Retired Nurse, adult PT Recommendations:  [place] / Referral to community resources:     Patient/Family's Response to care:  Patient's daughter expressed appreciation for CSW assistance.  Patient/Family's Understanding of and Emotional Response to Diagnosis, Current Treatment, and Prognosis:  Patient's daughter is aware of patient's abilities and limitations and realizes that he has deconditioned.   Emotional Assessment Appearance:  Appears stated age Attitude/Demeanor/Rapport:   (pleasantly confused) Affect (typically observed):  Calm Orientation:  Oriented to Self Alcohol / Substance use:  Not Applicable Psych involvement (Current and /or in the community):  No (Comment)  Discharge Needs  Concerns to be addressed:  Care Coordination Readmission within the last 30 days:  No Current discharge risk:  None Barriers to Discharge:  No Barriers Identified   Shela Leff, LCSW 10/04/2016, 8:00 PM

## 2016-10-04 NOTE — Progress Notes (Signed)
Called report to Kathyrn Drown.

## 2016-10-04 NOTE — Progress Notes (Signed)
Notified Dr. Marcille Blanco of pt having an 11 beat run of SVT.

## 2016-10-04 NOTE — Progress Notes (Signed)
Notified Dr. Vianne Bulls of glucose of 83. Per MD do not give levemir dose this AM

## 2016-10-04 NOTE — Progress Notes (Signed)
Inpatient Diabetes Program Recommendations  AACE/ADA: New Consensus Statement on Inpatient Glycemic Control (2015)  Target Ranges:  Prepandial:   less than 140 mg/dL      [facility] postprandial:   less than 180 mg/dL (1-2 hours)      Critically ill patients:  140 - 180 mg/dL   Lab Results  Component Value Date   GLUCAP 83 10/04/2016    Review of Glycemic ControlResults for MARTELL, MCFADYEN (MRN [id number]) as of 10/04/2016 10:58  Ref. Range 10/03/2016 17:15 10/03/2016 21:24 10/04/2016 07:34  Glucose-Capillary Latest Ref Range: 65 - 99 mg/dL 274 (H) 233 (H) 83   Diabetes history: Type 2 diabetes Outpatient Diabetes medications: NPH 20 units AM and 18 units PM, Metformin 1000 mg bid Current orders for Inpatient glycemic control:  Novolog sensitive tid with meals and HS, Levemir 20 units q AM and 18 units q PM Inpatient Diabetes Program Recommendations:    Note fasting CBG<100 mg/dL this morning. Consider reducing Levemir to 12 units bid.  Sent text page to MD.   Thanks, Adah Perl, RN, BC-ADM Inpatient Diabetes Coordinator Pager [phone] (8a-5p)

## 2016-10-04 NOTE — NC FL2 (Signed)
[place] LEVEL OF CARE SCREENING TOOL     IDENTIFICATION  Patient Name: Dennis Serrano Birthdate: 17-Aug-1932 Sex: male Admission Date (Current Location): 10/02/2016  [place] and [place] Number:  Engineering geologist and Address:  [facility], [address] [place], [place] [address]      Provider Number: [id number]  Attending Physician Name and Address:  [name], MD  Relative Name and Phone Number:       Current Level of Care: Hospital Recommended Level of Care: [place] Prior Approval Number:    Date Approved/Denied:   PASRR Number:    Discharge Plan: SNF    Current Diagnoses: Patient Active Problem List   Diagnosis Date Noted  . Sepsis ([place]) 10/02/2016  . CAP (community acquired pneumonia) 10/02/2016  . HTN (hypertension) 10/02/2016  . HLD (hyperlipidemia) 10/02/2016  . CAD (coronary artery disease) 10/02/2016  . GERD (gastroesophageal reflux disease) 10/02/2016    Orientation RESPIRATION BLADDER Height & Weight     Self  Normal, O2 (4 liters) Incontinent Weight: 154 lb 9.6 oz (70.1 kg) Height:  5\' 7"  (170.2 cm)  BEHAVIORAL SYMPTOMS/MOOD NEUROLOGICAL BOWEL NUTRITION STATUS   (none)  (none) Continent Diet (heart healthy/carb modified)  AMBULATORY STATUS COMMUNICATION OF NEEDS Skin   Extensive Assist Verbally Normal                       Personal Care Assistance Level of Assistance  Bathing, Dressing, Feeding Bathing Assistance: Limited assistance Feeding assistance: Limited assistance Dressing Assistance: Limited assistance     Functional Limitations Info  Sight Sight Info: Impaired        SPECIAL CARE FACTORS FREQUENCY  PT (By licensed PT)                    Contractures Contractures Info: Not present    Additional Factors Info  Code Status Code Status Info: full             Current Medications (10/04/2016):  This is the current hospital active medication  list Current Facility-Administered Medications  Medication Dose Route Frequency Provider Last Rate Last Dose  . 0.9 %  sodium chloride infusion   Intravenous Continuous Loletha Grayer, MD 40 mL/hr at 10/04/16 1731    . acetaminophen (TYLENOL) tablet 650 mg  650 mg Oral Q6H PRN Lance Coon, MD   650 mg at 10/04/16 1018   Or  . acetaminophen (TYLENOL) suppository 650 mg  650 mg Rectal Q6H PRN Lance Coon, MD      . amLODipine (NORVASC) tablet 5 mg  5 mg Oral Daily Lance Coon, MD   5 mg at 10/04/16 1018  . aspirin EC tablet 81 mg  81 mg Oral Daily Loletha Grayer, MD   81 mg at 10/04/16 1018  . azithromycin (ZITHROMAX) 500 mg in dextrose 5 % 250 mL IVPB  500 mg Intravenous Q24H Lance Coon, MD   500 mg at 10/04/16 1815  . benzonatate (TESSALON) capsule 200 mg  200 mg Oral TID PRN Lance Coon, MD      . carbidopa-levodopa (SINEMET IR) 25-100 MG per tablet immediate release 1 tablet  1 tablet Oral TID Lance Coon, MD   1 tablet at 10/04/16 1618  . cefTRIAXone (ROCEPHIN) 1 g in dextrose 5 % 50 mL IVPB  1 g Intravenous Q24H Rudene Re, MD 100 mL/hr at 10/03/16 1950 1 g at 10/03/16 1950  . diazepam (VALIUM) tablet 2 mg  2  mg Oral QHS PRN Loletha Grayer, MD      . diltiazem (CARDIZEM) tablet 60 mg  60 mg Oral Q6H PRN [name], MD      . enoxaparin (LOVENOX) injection 40 mg  40 mg Subcutaneous Q24H Lance Coon, MD   40 mg at 10/03/16 2134  . insulin aspart (novoLOG) injection 0-5 Units  0-5 Units Subcutaneous QHS Loletha Grayer, MD   2 Units at 10/03/16 2134  . insulin aspart (novoLOG) injection 0-9 Units  0-9 Units Subcutaneous TID WC Loletha Grayer, MD   5 Units at 10/04/16 1731  . insulin detemir (LEVEMIR) injection 12 Units  12 Units Subcutaneous QHS [name], MD      . Derrill Memo ON 10/05/2016] insulin detemir (LEVEMIR) injection 12 Units  12 Units Subcutaneous QAC breakfast [name], MD      . ipratropium-albuterol (DUONEB) 0.5-2.5 (3) MG/3ML nebulizer  solution 3 mL  3 mL Nebulization Q4H PRN Lance Coon, MD      . loratadine (CLARITIN) tablet 10 mg  10 mg Oral Daily Loletha Grayer, MD   10 mg at 10/04/16 1018  . nystatin (MYCOSTATIN) 100000 UNIT/ML suspension 500,000 Units  5 mL Oral QID Loletha Grayer, MD   500,000 Units at 10/04/16 1731  . ondansetron (ZOFRAN) tablet 4 mg  4 mg Oral Q6H PRN Lance Coon, MD       Or  . ondansetron [facility]) injection 4 mg  4 mg Intravenous Q6H PRN Lance Coon, MD      . pantoprazole (PROTONIX) EC tablet 40 mg  40 mg Oral Daily Lance Coon, MD   40 mg at 10/04/16 1018  . QUEtiapine (SEROQUEL) tablet 25 mg  25 mg Oral QHS Loletha Grayer, MD   25 mg at 10/03/16 2135  . tamsulosin (FLOMAX) capsule 0.4 mg  0.4 mg Oral Daily Lance Coon, MD   0.4 mg at 10/04/16 1018     Discharge Medications: Please see discharge summary for a list of discharge medications.  Relevant Imaging Results:  Relevant Lab Results:   Additional Information ss; [id number]  Shela Leff, LCSW

## 2016-10-04 NOTE — Progress Notes (Signed)
Notified Dr. Vianne Bulls of heart rate maintaining in the 130s and 140s. Per MD place order for stat EKG.

## 2016-10-04 NOTE — Progress Notes (Signed)
Notified MD of EKG result. Per MD give additional 30mg  dose of cardizem now and change order to cardizem 60mg  q 6prn for elevated heart rate. Also per MD place order for transfer to telemetry.

## 2016-10-05 LAB — GLUCOSE, CAPILLARY
Glucose-Capillary: 124 mg/dL — ABNORMAL HIGH (ref 65–99)
Glucose-Capillary: 168 mg/dL — ABNORMAL HIGH (ref 65–99)
Glucose-Capillary: 264 mg/dL — ABNORMAL HIGH (ref 65–99)

## 2016-10-05 LAB — ECHOCARDIOGRAM COMPLETE
Height: 67 in
Weight: 2473.6 oz

## 2016-10-05 NOTE — Progress Notes (Signed)
Pharmacy Antibiotic Note  Dennis Serrano is a 83 y.o. male admitted on 10/02/2016 with pneumonia.  Pharmacy has been consulted for ceftriaxone dosing.  Plan: Continue Ceftriaxone 1 gram q 24 hours ordered by MD. OK for pt parameters.  Height: 5\' 7"  (170.2 cm) Weight: 154 lb 9.6 oz (70.1 kg) IBW/kg (Calculated) : 66.1  Temp (24hrs), Avg:98.5 F (36.9 C), Min:97.3 F (36.3 C), Max:99.9 F (37.7 C)   Recent Labs Lab 10/02/16 1914 10/03/16 0513  WBC 17.2* 14.2*  CREATININE 1.12 0.75  LATICACIDVEN 1.9  --     Estimated Creatinine Clearance: 67.7 mL/min (by C-G formula based on SCr of 0.75 mg/dL).    No Known Allergies  Antimicrobials this admission: ceftriaxone azithromycin 4/8 >>    >>   Dose adjustments this admission:   Microbiology results: 4/8 BCx: pending 4/8 UCx: pending  4/8 Sputum: pending      4/8 UA: (-) Thank you for allowing pharmacy to be a part of this patient's care.  Dennis Serrano D 10/05/2016 1:37 PM

## 2016-10-05 NOTE — Clinical Social Work Placement (Signed)
   CLINICAL SOCIAL WORK PLACEMENT  NOTE  Date:  10/05/2016  Patient Details  Name: Dennis Serrano MRN: [id number] Date of Birth: 03-Mar-1933  Clinical Social Work is seeking post-discharge placement for this patient at the [place] level of care (*CSW will initial, date and re-position this form in  chart as items are completed):  Yes   Patient/family provided with [facility] Work Department's list of facilities offering this level of care within the geographic area requested by the patient (or if unable, by the patient's family).  Yes   Patient/family informed of their freedom to choose among providers that offer the needed level of care, that participate in Medicare, Medicaid or managed care program needed by the patient, have an available bed and are willing to accept the patient.  Yes   Patient/family informed of [facility]'s ownership interest in [facility] and [facility], as well as of the fact that they are under no obligation to receive care at these facilities.  PASRR submitted to EDS on 10/04/16     PASRR number received on 10/04/16     Existing PASRR number confirmed on       FL2 transmitted to all facilities in geographic area requested by pt/family on 10/04/16     FL2 transmitted to all facilities within larger geographic area on       Patient informed that his/her managed care company has contracts with or will negotiate with certain facilities, including the following:        Yes   Patient/family informed of bed offers received.  Patient chooses bed at  [facility] )     Physician recommends and patient chooses bed at      Patient to be transferred to   on  .  Patient to be transferred to facility by       Patient family notified on   of transfer.  Name of family member notified:        PHYSICIAN       Additional Comment:    _______________________________________________ Deundra Bard, Veronia Beets,  LCSW 10/05/2016, 3:00 PM

## 2016-10-05 NOTE — Progress Notes (Signed)
Clinical Social Worker (CSW) contacted patient's daughter [name] and presented bed offers. Daughter chose [facility]. [facility] admissions coordinator at [facility] is aware of accepted bed offer. PASARR has been received, [phone]. CSW will continue to follow and assist as needed.   [employer], LCSW [phone]

## 2016-10-05 NOTE — Progress Notes (Signed)
PT Cancellation Note  Patient Details Name: Dennis Serrano MRN: [id number] DOB: September 14, 1932   Cancelled Treatment:    Reason Eval/Treat Not Completed: Other (comment).  Attempted to see pt 2x's this afternoon but first time pt attempting to use urinal and asking PT to come back later (family present); 2nd time nursing techs entered room to clean pt up in bed.  Will re-attempt PT treatment at a later date/time as able.  Leitha Bleak, PT 10/05/16, 5:14 PM [phone]

## 2016-10-05 NOTE — Progress Notes (Signed)
Patient presently resting in the bed, remain alert and responsive, denies any pain rounded with MD at bedside, mood calm family and patient updated about POC , cont with IV ABT FAMILY AT BEDSIDE

## 2016-10-05 NOTE — Progress Notes (Signed)
Patient ID: Dennis Serrano, male   DOB: 04-23-1933, 83 y.o.   MRN: [id number]  [employer] [facility] PROGRESS NOTE  Dennis Serrano WNI:[id number] DOB: [birth year] DOA: 10/02/2016 PCP: Juline Patch, MD  HPI/Subjective: Has some cough. A. fib controlled, heart rate 90 and monitored, no chest pain or shortness of breath. Family at bedside.  Objective: Vitals:   10/05/16 0843 10/05/16 1124  BP: (!) 163/68 139/62  Pulse: 96 95  Resp: 20 18  Temp: 98.7 F (37.1 C) 97.9 F (36.6 C)    Filed Weights   10/02/16 1908 10/02/16 2334  Weight: 69.1 kg (152 lb 5.4 oz) 70.1 kg (154 lb 9.6 oz)    ROS: Review of Systems  Constitutional: Negative for chills and fever.  HENT: Negative for hearing loss.   Eyes: Negative for blurred vision, double vision and photophobia.  Respiratory: Positive for cough and shortness of breath. Negative for hemoptysis.   Cardiovascular: Negative for chest pain, palpitations, orthopnea and leg swelling.  Gastrointestinal: Negative for abdominal pain, diarrhea and vomiting.  Genitourinary: Negative for dysuria and urgency.  Musculoskeletal: Negative for myalgias and neck pain.  Skin: Negative for rash.  Neurological: Negative for dizziness, focal weakness, seizures, weakness and headaches.  Psychiatric/Behavioral: Negative for memory loss. The patient does not have insomnia.    Exam: Physical Exam  HENT:  Nose: No mucosal edema.  Mouth/Throat: No oropharyngeal exudate.  Thrush seen on the tongue  Eyes: Conjunctivae and lids are normal. Pupils are equal, round, and reactive to light.  Neck: Carotid bruit is not present. No thyromegaly present.  Cardiovascular: S1 normal, S2 normal and normal heart sounds.   Respiratory: He has no decreased breath sounds. He has wheezes. He has no rhonchi. He has no rales.  GI: Soft. Bowel sounds are normal. There is no tenderness.  Musculoskeletal:       Right ankle: He exhibits no swelling.       Left ankle: He exhibits no  swelling.  Neurological: He is alert.  Patient moves all of his extremities on his own. Very agitated in the bed. But does answer questions.  Skin: Skin is warm. Nails show no clubbing.  Scabs seen on legs bilaterally.  Psychiatric: He is not agitated.      Data Reviewed: Basic Metabolic Panel:  Recent Labs Lab 10/02/16 1914 10/03/16 0513  NA 137 138  K 3.8 3.8  CL 99* 105  CO2 29 23  GLUCOSE 346* 377*  BUN 16 15  CREATININE 1.12 0.75  CALCIUM 9.2 8.7*   Liver Function Tests:  Recent Labs Lab 10/02/16 1914  AST 41  ALT 16*  ALKPHOS 100  BILITOT 0.8  PROT 7.5  ALBUMIN 3.6   CBC:  Recent Labs Lab 10/02/16 1914 10/03/16 0513  WBC 17.2* 14.2*  NEUTROABS 15.8*  --   HGB 13.5 12.2*  HCT 39.9* 36.4*  MCV 84.8 84.3  PLT 245 219     Recent Results (from the past 240 hour(s))  Blood Culture (routine x 2)     Status: None (Preliminary result)   Collection Time: 10/02/16  7:14 PM  Result Value Ref Range Status   Specimen Description BLOOD LEFT ANTECUBITAL  Final   Special Requests   Final    BOTTLES DRAWN AEROBIC AND ANAEROBIC Blood Culture adequate volume   Culture NO GROWTH 3 DAYS  Final   Report Status PENDING  Incomplete  Blood Culture (routine x 2)     Status: None (Preliminary result)   Collection Time: 10/02/16  7:14 PM  Result Value Ref Range Status   Specimen Description BLOOD LEFT FOREARM  Final   Special Requests   Final    BOTTLES DRAWN AEROBIC AND ANAEROBIC Blood Culture adequate volume   Culture NO GROWTH 3 DAYS  Final   Report Status PENDING  Incomplete  Urine culture     Status: Abnormal   Collection Time: 10/03/16 12:25 AM  Result Value Ref Range Status   Specimen Description URINE, RANDOM  Final   Special Requests NONE  Final   Culture (A)  Final    <10,000 COLONIES/mL INSIGNIFICANT GROWTH Performed at [facility], [place] [address], [place], [place] [address]    Report Status 10/04/2016 FINAL  Final  Culture, expectorated  sputum-assessment     Status: None   Collection Time: 10/04/16  7:00 PM  Result Value Ref Range Status   Specimen Description SPUTUM  Final   Special Requests NONE  Final   Sputum evaluation THIS SPECIMEN IS ACCEPTABLE FOR SPUTUM CULTURE  Final   Report Status 10/04/2016 FINAL  Final  Culture, respiratory (NON-Expectorated)     Status: None (Preliminary result)   Collection Time: 10/04/16  7:00 PM  Result Value Ref Range Status   Specimen Description SPUTUM  Final   Special Requests NONE Reflexed from [id number]  Final   Gram Stain   Final    MODERATE WBC PRESENT,BOTH PMN AND MONONUCLEAR NO ORGANISMS SEEN Performed at [facility], [place] [address] [place],  [address]    Culture PENDING  Incomplete   Report Status PENDING  Incomplete     Studies: No results found.  Scheduled Meds: . amLODipine  5 mg Oral Daily  . aspirin EC  81 mg Oral Daily  . azithromycin  500 mg Intravenous Q24H  . carbidopa-levodopa  1 tablet Oral TID  . cefTRIAXone (ROCEPHIN)  IV  1 g Intravenous Q24H  . enoxaparin (LOVENOX) injection  40 mg Subcutaneous Q24H  . insulin aspart  0-5 Units Subcutaneous QHS  . insulin aspart  0-9 Units Subcutaneous TID WC  . insulin detemir  12 Units Subcutaneous QHS  . insulin detemir  12 Units Subcutaneous QAC breakfast  . loratadine  10 mg Oral Daily  . nystatin  5 mL Oral QID  . pantoprazole  40 mg Oral Daily  . QUEtiapine  25 mg Oral QHS  . tamsulosin  0.4 mg Oral Daily   Continuous Infusions: . sodium chloride 40 mL/hr at 10/04/16 1731    Assessment/Plan:  1. Clinical sepsis secondary to pneumonia. On Rocephin and Zithromax.continue Antibiotics, follow the trend of WBC, cultures negative so far. Continue oxygen to keep sats more than 90%. Wean off oxygen as tolerated. Now 95% saturation on room air. 2.  3. Dementia with behavioral disturbance.  On Seroquel  4. Essential hypertension on Norvasc 5. Type 2 diabetes mellitus ;low fasting BG..adjusted  lantus. 6. Thrush on nystatin 7. BPH on Flomax 8. Parkinson's disease on Sinemet 9. Hyperlipidemia ; statins 10. GERD on PPI 11. History of coronary artery disease 12. Weakness and falls. Physical therapy recommended SNF New onset afib with RVR; heart rate improved with by mouth Cardizem only. Check echocardiogram, not a candidate for full dos A c because of dementia and high risk for falls. Continue aspirin alone. D/w family; Code Status:     Code Status Orders        Start     Ordered   10/02/16 2325  Full code  Continuous  10/02/16 2324    Code Status History    Date Active Date Inactive Code Status Order ID Comments User Context   This patient has a current code status but no historical code status.    Advance Directive Documentation     Most Recent Value  Type of Advance Directive  Healthcare Power of Attorney  Pre-existing out of facility DNR order (yellow form or pink MOST form)  -  "MOST" Form in Place?  -     Family Communication: Spoke with wife  at the bedside Disposition Plan: SNF  Antibiotics:  Rocephin  Zithromax  Time spent: 28 minutes  [facility]

## 2016-10-06 ENCOUNTER — Inpatient Hospital Stay: Payer: Medicare Other

## 2016-10-06 LAB — GLUCOSE, CAPILLARY
GLUCOSE-CAPILLARY: 303 mg/dL — AB (ref 65–99)
Glucose-Capillary: 181 mg/dL — ABNORMAL HIGH (ref 65–99)
Glucose-Capillary: 301 mg/dL — ABNORMAL HIGH (ref 65–99)
Glucose-Capillary: 173 mg/dL — ABNORMAL HIGH (ref 65–99)

## 2016-10-06 LAB — CBC
HEMATOCRIT: 34.6 % — AB (ref 40.0–52.0)
HEMOGLOBIN: 11.8 g/dL — AB (ref 13.0–18.0)
MCH: 28.6 pg (ref 26.0–34.0)
MCHC: 34 g/dL (ref 32.0–36.0)
MCV: 84 fL (ref 80.0–100.0)
Platelets: 282 10*3/uL (ref 150–440)
RBC: 4.12 MIL/uL — ABNORMAL LOW (ref 4.40–5.90)
RDW: 15.1 % — ABNORMAL HIGH (ref 11.5–14.5)
WBC: 5.2 10*3/uL (ref 3.8–10.6)

## 2016-10-06 NOTE — Progress Notes (Signed)
Patient ID: Dennis Serrano, male   DOB: [birth year], 83 y.o.   MRN: [id number]  [employer] PROGRESS NOTE  Mynor Witkop OLM:[id number] DOB: Jul 25, 1932 DOA: 10/02/2016 PCP: Juline Patch, MD  HPI/Subjective:   Objective: Vitals:   10/06/16 1023 10/06/16 1148  BP:  (!) 159/74  Pulse: (!) 105 98  Resp:  19  Temp:  98.9 F (37.2 C)    Filed Weights   10/02/16 1908 10/02/16 2334  Weight: 69.1 kg (152 lb 5.4 oz) 70.1 kg (154 lb 9.6 oz)    ROS: Review of Systems  Constitutional: Negative for chills and fever.  HENT: Negative for hearing loss.   Eyes: Negative for blurred vision, double vision and photophobia.  Respiratory: Positive for cough and shortness of breath. Negative for hemoptysis.   Cardiovascular: Negative for chest pain, palpitations, orthopnea and leg swelling.  Gastrointestinal: Negative for abdominal pain, diarrhea and vomiting.  Genitourinary: Negative for dysuria and urgency.  Musculoskeletal: Negative for myalgias and neck pain.  Skin: Negative for rash.  Neurological: Negative for dizziness, focal weakness, seizures, weakness and headaches.  Psychiatric/Behavioral: Negative for memory loss. The patient does not have insomnia.    Exam: Physical Exam  HENT:  Nose: No mucosal edema.  Mouth/Throat: No oropharyngeal exudate.  Thrush seen on the tongue  Eyes: Conjunctivae and lids are normal. Pupils are equal, round, and reactive to light.  Neck: Carotid bruit is not present. No thyromegaly present.  Cardiovascular: S1 normal, S2 normal and normal heart sounds.   Respiratory: He has no decreased breath sounds. He has no wheezes. He has no rhonchi. He has no rales.  GI: Soft. Bowel sounds are normal. There is no tenderness.  Musculoskeletal:       Right ankle: He exhibits no swelling.       Left ankle: He exhibits no swelling.       Feet:  Neurological: He is alert.  Patient moves all of his extremities on his own. Very agitated in the bed. But does  answer questions.  Skin: Skin is warm. Nails show no clubbing.  Scabs seen on legs bilaterally.  Psychiatric: He is not agitated.      Data Reviewed: Basic Metabolic Panel:  Recent Labs Lab 10/02/16 1914 10/03/16 0513  NA 137 138  K 3.8 3.8  CL 99* 105  CO2 29 23  GLUCOSE 346* 377*  BUN 16 15  CREATININE 1.12 0.75  CALCIUM 9.2 8.7*   Liver Function Tests:  Recent Labs Lab 10/02/16 1914  AST 41  ALT 16*  ALKPHOS 100  BILITOT 0.8  PROT 7.5  ALBUMIN 3.6   CBC:  Recent Labs Lab 10/02/16 1914 10/03/16 0513 10/06/16 0555  WBC 17.2* 14.2* 5.2  NEUTROABS 15.8*  --   --   HGB 13.5 12.2* 11.8*  HCT 39.9* 36.4* 34.6*  MCV 84.8 84.3 84.0  PLT 245 219 282     Recent Results (from the past 240 hour(s))  Blood Culture (routine x 2)     Status: None (Preliminary result)   Collection Time: 10/02/16  7:14 PM  Result Value Ref Range Status   Specimen Description BLOOD LEFT ANTECUBITAL  Final   Special Requests   Final    BOTTLES DRAWN AEROBIC AND ANAEROBIC Blood Culture adequate volume   Culture NO GROWTH 4 DAYS  Final   Report Status PENDING  Incomplete  Blood Culture (routine x 2)     Status: None (Preliminary result)   Collection Time: 10/02/16  7:14 PM  Result Value Ref Range Status   Specimen Description BLOOD LEFT FOREARM  Final   Special Requests   Final    BOTTLES DRAWN AEROBIC AND ANAEROBIC Blood Culture adequate volume   Culture NO GROWTH 4 DAYS  Final   Report Status PENDING  Incomplete  Urine culture     Status: Abnormal   Collection Time: 10/03/16 12:25 AM  Result Value Ref Range Status   Specimen Description URINE, RANDOM  Final   Special Requests NONE  Final   Culture (A)  Final    <10,000 COLONIES/mL INSIGNIFICANT GROWTH Performed at [facility], [place] [address] [place], [place] [address]    Report Status 10/04/2016 FINAL  Final  Culture, expectorated sputum-assessment     Status: None   Collection Time: 10/04/16  7:00 PM  Result  Value Ref Range Status   Specimen Description SPUTUM  Final   Special Requests NONE  Final   Sputum evaluation THIS SPECIMEN IS ACCEPTABLE FOR SPUTUM CULTURE  Final   Report Status 10/04/2016 FINAL  Final  Culture, respiratory (NON-Expectorated)     Status: None (Preliminary result)   Collection Time: 10/04/16  7:00 PM  Result Value Ref Range Status   Specimen Description SPUTUM  Final   Special Requests NONE Reflexed from [id number]  Final   Gram Stain   Final    MODERATE WBC PRESENT,BOTH PMN AND MONONUCLEAR NO ORGANISMS SEEN    Culture   Final    CULTURE REINCUBATED FOR BETTER GROWTH Performed at [facility], [place] [address], [place], [place] [address]    Report Status PENDING  Incomplete     Studies: No results found.  Scheduled Meds: . amLODipine  5 mg Oral Daily  . aspirin EC  81 mg Oral Daily  . azithromycin  500 mg Intravenous Q24H  . carbidopa-levodopa  1 tablet Oral TID  . cefTRIAXone (ROCEPHIN)  IV  1 g Intravenous Q24H  . enoxaparin (LOVENOX) injection  40 mg Subcutaneous Q24H  . insulin aspart  0-5 Units Subcutaneous QHS  . insulin aspart  0-9 Units Subcutaneous TID WC  . insulin detemir  12 Units Subcutaneous QHS  . insulin detemir  12 Units Subcutaneous QAC breakfast  . loratadine  10 mg Oral Daily  . nystatin  5 mL Oral QID  . pantoprazole  40 mg Oral Daily  . QUEtiapine  25 mg Oral QHS  . tamsulosin  0.4 mg Oral Daily   Continuous Infusions:   Assessment/Plan:  1. Clinical sepsis secondary to pneumonia. Patient is clinically better, O2 saturation improved to 95% on 2 L, 88% on room air. Continue oxygen, continue antibiotics. Does have some cough and get sputum cultures if he has any sputum. 2.  3.  4. Dementia with behavioral disturbance.  On Seroquel , sitter was ordered  Last  night but I don't see any documentation why he needed. At this time wife at the bedside and he appears comfortable without any agitation so discontinue sitter. 5. Essential  hypertension on Norvasc 6. Type 2 diabetes mellitus; controlled. Continue present dose of insulin. 7. Thrush on nystatin 8. BPH on Flomax 9. Parkinson's disease on Sinemet 10. Hyperlipidemia ; statins 11. GERD on PPI 12. History of coronary artery disease 13. Weakness and falls. Physical therapy recommended SNF New onset afib with RVR; heart rate improved with by mouth Cardizem only. echo cardiogram showed EF more than 50%., not a candidate for full dos A c because of dementia and high risk  for falls. Continue aspirin alone 14. Right foot pain on plantar side likely plantar fasciitis. But because of multiple falls get x-ray of the right  Foot  which negative for fracture we will treat the plantar fasciitis. .15. disContinue Foley. Use urinal.  D/w family; Code Status:     Code Status Orders        Start     Ordered   10/02/16 2325  Full code  Continuous     10/02/16 2324    Code Status History    Date Active Date Inactive Code Status Order ID Comments User Context   This patient has a current code status but no historical code status.    Advance Directive Documentation     Most Recent Value  Type of Advance Directive  Healthcare Power of Attorney  Pre-existing out of facility DNR order (yellow form or pink MOST form)  -  "MOST" Form in Place?  -     Family Communication: Spoke with wife  at the bedside Disposition Plan: SNF  Antibiotics:  Rocephin  Zithromax  Time spent: 28 minutes  [facility]

## 2016-10-06 NOTE — Progress Notes (Signed)
Physical Therapy Treatment Patient Details Name: Dennis Serrano MRN: [id number] DOB: 04-07-1933 Today's Date: 10/06/2016    History of Present Illness Pt is an 83 y.o. male presenting to hospital with AMS, increased sleeping, decreased appetite, and cough.  Pt admitted with sepsis and community acquired PNA.  PMH includes Parkinson's disease, dementia, DM, and CAD.    PT Comments    Pt agreeable to PT; denies pain initially, but later during session notes touch me not R foot pain to the plantar aspect of forefoot. Pt's initial O2 saturation on room air 88%. Pt supposed to be on 2 liter O2 , but had removed; 2 liters replaced with improved O2 saturation to 92%. Pt participates in supine bed exercises with assist as needed. Min A for bed mobility; 2+ assist for stand transfer and short gait to chair. Pt unsteady on feel and pain in  R foot. MD in towards session end; discussed R foot pain and assessed by MD. MD to order xray to start. Pt received up in chair comfortably. Continue PT to progress strength, endurance and balance to improve all functional mobility. Check x ray result and proceed per outcome regarding status of R foot.    Follow Up Recommendations  SNF     Equipment Recommendations  Rolling walker with 5" wheels    Recommendations for Other Services       Precautions / Restrictions Precautions Precautions: Fall Restrictions Weight Bearing Restrictions: No    Mobility  Bed Mobility Overal bed mobility: Needs Assistance Bed Mobility: Supine to Sit     Supine to sit: Min assist     General bed mobility comments: Heavy cues for task; trunk assist  Transfers Overall transfer level: Needs assistance Equipment used: Rolling walker (2 wheeled) Transfers: Sit to/from Stand Sit to Stand: Min assist;+2 physical assistance;+2 safety/equipment         General transfer comment: Cues for hand placement; difficulty with wb'g on R foot  Ambulation/Gait Ambulation/Gait  assistance: Mod assist;+2 physical assistance;+2 safety/equipment Ambulation Distance (Feet): 3 Feet Assistive device: Rolling walker (2 wheeled) Gait Pattern/deviations: Step-to pattern;Decreased stride length;Decreased stance time - right;Decreased weight shift to right;Antalgic;Trunk flexed Gait velocity: decreased Gait velocity interpretation: <1.8 ft/sec, indicative of risk for recurrent falls General Gait Details: Unsteady; difficulty with wb'g R foot   Stairs            Wheelchair Mobility    Modified Rankin (Stroke Patients Only)       Balance Overall balance assessment: Needs assistance;History of Falls Sitting-balance support: Bilateral upper extremity supported;Feet supported Sitting balance-Leahy Scale: Fair     Standing balance support: Bilateral upper extremity supported Standing balance-Leahy Scale: Poor                              Cognition Arousal/Alertness: Awake/alert Behavior During Therapy: WFL for tasks assessed/performed Overall Cognitive Status: History of cognitive impairments - at baseline                                 General Comments: eager to get out of bed      Exercises General Exercises - Lower Extremity Ankle Circles/Pumps: AROM;Both;20 reps;Supine Quad Sets: Strengthening;Both;10 reps;Supine Gluteal Sets: Strengthening;Both;10 reps;Supine Short Arc Quad: AROM;Both;20 reps;Supine Long Arc Quad: AROM;Both;10 reps;Seated (decreased range) Heel Slides: AROM;Both;10 reps;Supine Hip ABduction/ADduction: AAROM;Both;20 reps;Supine Straight Leg Raises: AAROM;Both;10 reps;Supine Hip Flexion/Marching: AROM;Both;10 reps;Seated Toe Raises:  Other (comment) (attempted; unable on R due to foot pain)    General Comments        Pertinent Vitals/Pain Pain Assessment: Faces Faces Pain Scale: Hurts whole lot Pain Location: R foot with wb'g and touch to plantar forefoot Pain Descriptors / Indicators: Sharp Pain  Intervention(s): Other (comment) (Spoke with MD regarding; suggested x ray)    Home Living                      Prior Function            PT Goals (current goals can now be found in the care plan section) Progress towards PT goals: Progressing toward goals    Frequency    Min 2X/week      PT Plan Current plan remains appropriate    Co-evaluation             End of Session Equipment Utilized During Treatment: Gait belt;Oxygen Activity Tolerance: Patient tolerated treatment well;Patient limited by pain (R foot pain) Patient left: in chair;with call bell/phone within reach;with chair alarm set;with family/visitor present;Other (comment) (MD in room) Nurse Communication: Other (comment) (MD regarding R foot pain) PT Visit Diagnosis: Unsteadiness on feet (R26.81);History of falling (Z91.81);Repeated falls (R29.6);Muscle weakness (generalized) (M62.81);Difficulty in walking, not elsewhere classified (R26.2)     Time: 4008-6761 PT Time Calculation (min) (ACUTE ONLY): 43 min  Charges:  $Gait Training: 8-22 mins $Therapeutic Exercise: 8-22 mins $Therapeutic Activity: 8-22 mins                    G Codes:        Larae Grooms, PTA 10/06/2016, 11:45 AM

## 2016-10-06 NOTE — Progress Notes (Signed)
Per MD patient will likely be ready for D/C tomorrow. CSW made MD aware that patient will have to be without a sitter for 24 hours before going to [facility]. Plan is for patient to D/C to [facility]. [facility] admissions coordinator at [facility] is aware patient will likely D/C tomorrow.   [employer], LCSW [phone]

## 2016-10-07 DIAGNOSIS — L8990 Pressure ulcer of unspecified site, unspecified stage: Secondary | ICD-10-CM | POA: Insufficient documentation

## 2016-10-07 LAB — CULTURE, RESPIRATORY: CULTURE: NORMAL

## 2016-10-07 LAB — GLUCOSE, CAPILLARY
GLUCOSE-CAPILLARY: 274 mg/dL — AB (ref 65–99)
Glucose-Capillary: 151 mg/dL — ABNORMAL HIGH (ref 65–99)
Glucose-Capillary: 131 mg/dL — ABNORMAL HIGH (ref 65–99)
Glucose-Capillary: 195 mg/dL — ABNORMAL HIGH (ref 65–99)

## 2016-10-07 LAB — CULTURE, BLOOD (ROUTINE X 2)
CULTURE: NO GROWTH
Culture: NO GROWTH
SPECIAL REQUESTS: ADEQUATE
SPECIAL REQUESTS: ADEQUATE

## 2016-10-07 LAB — CULTURE, RESPIRATORY W GRAM STAIN

## 2016-10-07 MED ORDER — ASPIRIN 325 MG PO TABS
325.0000 mg | ORAL_TABLET | Freq: Every day | ORAL | Status: DC
  Administered 2016-10-07 – 2016-10-08 (×2): 325 mg via ORAL
  Filled 2016-10-07 (×2): qty 1

## 2016-10-07 MED ORDER — OXYCODONE-ACETAMINOPHEN 5-325 MG PO TABS
1.0000 | ORAL_TABLET | Freq: Once | ORAL | Status: AC
  Administered 2016-10-07: 1 via ORAL
  Filled 2016-10-07: qty 1

## 2016-10-07 MED ORDER — AMOXICILLIN-POT CLAVULANATE 875-125 MG PO TABS: 1.0000 | ORAL_TABLET | Freq: Two times a day (BID) | ORAL | 0 refills | Status: DC

## 2016-10-07 MED ORDER — METAXALONE 400 MG PO TABS: 400.0000 mg | ORAL_TABLET | Freq: Three times a day (TID) | ORAL | 0 refills | Status: DC

## 2016-10-07 MED ORDER — DILTIAZEM HCL 60 MG PO TABS
60.0000 mg | ORAL_TABLET | Freq: Four times a day (QID) | ORAL | Status: DC
  Administered 2016-10-07 – 2016-10-08 (×3): 60 mg via ORAL
  Filled 2016-10-07 (×3): qty 1

## 2016-10-07 MED ORDER — INSULIN DETEMIR 100 UNIT/ML ~~LOC~~ SOLN: 12.0000 [IU] | Freq: Every day | SUBCUTANEOUS | 11 refills | Status: DC

## 2016-10-07 MED ORDER — METAXALONE 400 MG HALF TABLET
400.0000 mg | ORAL_TABLET | Freq: Three times a day (TID) | ORAL | Status: DC
  Administered 2016-10-07 – 2016-10-08 (×3): 400 mg via ORAL
  Filled 2016-10-07 (×7): qty 1

## 2016-10-07 MED ORDER — AMLODIPINE BESYLATE 5 MG PO TABS: 5.0000 mg | ORAL_TABLET | Freq: Every day | ORAL | 0 refills | Status: DC

## 2016-10-07 MED ORDER — ALBUTEROL SULFATE HFA 108 (90 BASE) MCG/ACT IN AERS: 2.0000 | INHALATION_SPRAY | Freq: Four times a day (QID) | RESPIRATORY_TRACT | 2 refills | Status: DC | PRN

## 2016-10-07 MED ORDER — OXYCODONE-ACETAMINOPHEN 5-325 MG PO TABS
1.0000 | ORAL_TABLET | Freq: Four times a day (QID) | ORAL | Status: DC | PRN
  Administered 2016-10-07 – 2016-10-08 (×2): 1 via ORAL
  Filled 2016-10-07 (×2): qty 1

## 2016-10-07 MED ORDER — AZITHROMYCIN 500 MG PO TABS: 500.0000 mg | ORAL_TABLET | Freq: Every day | ORAL | 0 refills | Status: DC

## 2016-10-07 MED ORDER — QUETIAPINE FUMARATE 25 MG PO TABS: 25.0000 mg | ORAL_TABLET | Freq: Every day | ORAL | 0 refills | Status: DC

## 2016-10-07 NOTE — Progress Notes (Signed)
Pt still complaining if 5/10 neck pain. Orders for oxycodone received. I will continue to assess.

## 2016-10-07 NOTE — Progress Notes (Signed)
Pharmacy Antibiotic Note  Dennis Serrano is a 83 y.o. male admitted on 10/02/2016 with pneumonia.  Pharmacy has been consulted for ceftriaxone dosing.  Plan: Continue Ceftriaxone 1 gram q 24 hours ordered by MD. OK for pt parameters.  Height: 5\' 7"  (170.2 cm) Weight: 154 lb 9.6 oz (70.1 kg) IBW/kg (Calculated) : 66.1  Temp (24hrs), Avg:98.3 F (36.8 C), Min:98.1 F (36.7 C), Max:98.5 F (36.9 C)   Recent Labs Lab 10/02/16 1914 10/03/16 0513 10/06/16 0555  WBC 17.2* 14.2* 5.2  CREATININE 1.12 0.75  --   LATICACIDVEN 1.9  --   --     Estimated Creatinine Clearance: 67.7 mL/min (by C-G formula based on SCr of 0.75 mg/dL).    No Known Allergies  Antimicrobials this admission: ceftriaxone azithromycin 4/8 >>    Dose adjustments this admission:  Microbiology results: 4/8 BCx: negative 4/8 UCx: insignificant growth 4/8 Sputum: normal flora  4/8 UA: (-) Thank you for allowing pharmacy to be a part of this patient's care.  Ulice Dash D 10/07/2016 1:26 PM

## 2016-10-07 NOTE — Care Management Important Message (Signed)
Important Message  Patient Details  Name: Dennis Serrano MRN: [id number] Date of Birth: [birth year]   Medicare Important Message Given:  Yes Signed IM notice given    Katrina Stack, RN 10/07/2016, 12:14 PM

## 2016-10-07 NOTE — Progress Notes (Signed)
Plan is for patient to D/C to [place] over the weekend if medically stable. Per Tiffany admissions coordinator at [facility] patient can come over the weekend to room 407. Clinical Education officer, museum (CSW) sent D/C summary to [employer] today via [employer]. Patient's daughter Lucretia Field is aware of above. Daughter requested for CSW to call patient's granddaughter Donald Pore [phone] if patient discharges this weekend. CSW will continue to follow and assist as needed.   [employer], LCSW [phone]

## 2016-10-07 NOTE — Progress Notes (Signed)
SATURATION QUALIFICATIONS: (This note is used to comply with regulatory documentation for home oxygen)  Patient Saturations on Room Air at Rest = 87%  Patient Saturations on Room Air while Ambulating = n/a%  Patient Saturations on 2 Liters of oxygen while resting = 92%  Please briefly explain why patient needs home oxygen:

## 2016-10-07 NOTE — Progress Notes (Signed)
Inpatient Diabetes Program Recommendations  AACE/ADA: New Consensus Statement on Inpatient Glycemic Control (2015)  Target Ranges:  Prepandial:   less than 140 mg/dL      Peak postprandial:   less than 180 mg/dL (1-2 hours)      Critically ill patients:  140 - 180 mg/dL   Lab Results  Component Value Date   GLUCAP 274 (H) 10/07/2016    Review of Glycemic Control Results for SIMRAN, MANNIS (MRN [id number]) as of 10/07/2016 12:22  Ref. Range 10/06/2016 11:51 10/06/2016 17:10 10/06/2016 20:37 10/07/2016 07:54 10/07/2016 11:38  Glucose-Capillary Latest Ref Range: 65 - 99 mg/dL 301 (H) 173 (H) 303 (H) 151 (H) 274 (H)    Diabetes history:Type 2 diabetes Outpatient Diabetes medications: NPH 20 units AM and 18 units PM, Metformin 1000 mg bid  Current orders for Inpatient glycemic control: Novolog sensitive tid with meals and Novolog 0-5 units qhs, Levemir 12 units bid  Inpatient Diabetes Program Recommendations:  Agree with current medications for blood sugar management.   Elevated CBG yesterday likely as a result of 357ml of IV dextrose plus a meal from 1700 to the next blood sugar at 2037- do not recommend insulin adjustments.    Gentry Fitz, RN, BA, MHA, CDE Diabetes Coordinator Inpatient Diabetes Program  [phone] (Team Pager) [phone] ([place]) 10/07/2016 12:28 PM

## 2016-10-07 NOTE — Discharge Summary (Signed)
Dennis Serrano, is a 83 y.o. male  DOB January 10, 1933  MRN [id number].  Admission date:  10/02/2016  Admitting Physician  Lance Coon, MD  Discharge Date:  10/07/2016   Primary MD  Juline Patch, MD  Recommendations for primary care physician for things to follow:   Follow-up with primary doctor in 1 week   Admission Diagnosis  Sepsis, due to unspecified organism ([place]) [A41.9] Community acquired pneumonia, unspecified laterality [J18.9]   Discharge Diagnosis  Sepsis, due to unspecified organism ([place]) [A41.9] Community acquired pneumonia, unspecified laterality [J18.9]    Principal Problem:   Sepsis ([place]) Active Problems:   CAP (community acquired pneumonia)   HTN (hypertension)   HLD (hyperlipidemia)   CAD (coronary artery disease)   GERD (gastroesophageal reflux disease)   Pressure injury of skin      Past Medical History:  Diagnosis Date  . Anxiety   . CAD (coronary artery disease)   . Dementia   . Diabetes mellitus without complication ([place])   . GERD (gastroesophageal reflux disease)   . History of heart artery stent   . HLD (hyperlipidemia)   . HTN (hypertension)   . Parkinson disease [facility])     Past Surgical History:  Procedure Laterality Date  . APPENDECTOMY    . CORONARY STENT PLACEMENT    . EYE SURGERY    . TRANSURETHRAL RESECTION OF PROSTATE         History of present illness and  Hospital Course:     Kindly see H&P for history of present illness and admission details, please review complete Labs, Consult reports and Test reports for all details in brief  HPI  from the history and physical done on the day of admission  83 year old male patient admitted because of increasing confusion, cough and admitted and found to have pneumonia.   Hospital Course  1 sepsis present on admission  secondary to pneumonia, lactic acid was normal. Patient received broad-spectrum IV antibiotics, blood cultures have been negative, presently on Rocephin, Zithromax, WBC decreased from 17.2 30.2.  #2 acute respiratory failure secondary to pneumonia: Patient breathing better with less wheezing, still living 2 L of oxygen, with oxygen saturation dropping to below 88%. So he needs to go to a nursing home with 2 L Oxygen.  #3 multiple falls, physical therapy recommended skilled nursing.  #4 .new-onset atrial fibrillation: Patient developed a A. fib with RVR 2 days ago, transferred him to telemetry, started on Cardizem, did not start low-dose anticoagulation because of an full falls, high risk for bleeds, continue aspirin alone, heart rate controlled with Cardizem. Echocardiogram showed EF more than 55%.  Number 5N this is: Continue Sinemet #6 BPH continue Flomax #7 diabetes mellitus type 2: Patient is on Lantus   Discharge Condition: stable   Follow UP  Contact information for after-discharge care    [facility] .   Specialty:  [place] information: [place] [phone]                Discharge Instructions  and  Discharge Medications      Allergies as of 10/07/2016   No Known Allergies     Medication List    STOP taking these medications   insulin NPH Human 100 UNIT/ML injection Commonly known as:  HUMULIN N,NOVOLIN N   metFORMIN 1000 MG tablet Commonly known as:  GLUCOPHAGE     TAKE these medications   albuterol  108 (90 Base) MCG/ACT inhaler Commonly known as:  PROVENTIL HFA;VENTOLIN HFA Inhale 2 puffs into the lungs every 6 (six) hours as needed for wheezing or shortness of breath.   amLODipine 5 MG tablet Commonly known as:  NORVASC Take 1 tablet (5 mg total) by mouth daily. Start taking on:  10/08/2016 What changed:  medication strength  how  much to take  Another medication with the same name was removed. Continue taking this medication, and follow the directions you see here.   amoxicillin-clavulanate 875-125 MG tablet Commonly known as:  AUGMENTIN Take 1 tablet by mouth 2 (two) times daily.   azithromycin 500 MG tablet Commonly known as:  ZITHROMAX Take 1 tablet (500 mg total) by mouth daily.   carbidopa-levodopa 25-100 MG tablet Commonly known as:  SINEMET IR Take 1 tablet by mouth 3 (three) times daily.   cyclobenzaprine 5 MG tablet Commonly known as:  FLEXERIL Take 5 mg by mouth 3 (three) times daily as needed for muscle spasms.   gabapentin 300 MG capsule Commonly known as:  NEURONTIN Take 300 mg by mouth 2 (two) times daily.   insulin detemir 100 UNIT/ML injection Commonly known as:  LEVEMIR Inject 0.12 mLs (12 Units total) into the skin at bedtime.   insulin detemir 100 UNIT/ML injection Commonly known as:  LEVEMIR Inject 0.12 mLs (12 Units total) into the skin daily before breakfast. Start taking on:  10/08/2016   metaxalone 400 MG tablet Commonly known as:  SKELAXIN Take 1 tablet (400 mg total) by mouth 3 (three) times daily.   omeprazole 20 MG capsule Commonly known as:  PRILOSEC Take 20 mg by mouth daily.   ondansetron 4 MG tablet Commonly known as:  ZOFRAN Take 4 mg by mouth daily as needed for nausea.   QUEtiapine 25 MG tablet Commonly known as:  SEROQUEL Take 1 tablet (25 mg total) by mouth at bedtime.   spironolactone 50 MG tablet Commonly known as:  ALDACTONE Take 50 mg by mouth daily.   tamsulosin 0.4 MG Caps capsule Commonly known as:  FLOMAX Take 0.4 mg by mouth daily.         Diet and Activity recommendation: See Discharge Instructions above   Consults obtained -PT   Major procedures and Radiology Reports - PLEASE review detailed and final reports for all details, in brief -     Dg Chest Port 1 View  Result Date: 10/02/2016 CLINICAL DATA:  Cough and weakness for  several days.  Sepsis. EXAM: PORTABLE CHEST 1 VIEW COMPARISON:  None. FINDINGS: The heart size and mediastinal contours are within normal limits. Aortic atherosclerosis. Both lungs are clear. The visualized skeletal structures are unremarkable. Pulmonary opacity is seen in the left retrocardiac lung base, suspicious for pneumonia. Right lung is clear. IMPRESSION: Left retrocardiac opacity, suspicious for pneumonia. Recommend post-treatment followup PA and lateral chest X-ray in several weeks to ensure resolution. Electronically Signed   By: Earle Gell M.D.   On: 10/02/2016 19:50   Dg Foot 2 Views Right  Result Date: 10/06/2016 CLINICAL DATA:  83 y/o  M; 2 days of foot pain.  No injury. EXAM: RIGHT FOOT - 2 VIEW COMPARISON:  None. FINDINGS: There is no evidence of fracture or dislocation. There is no evidence of arthropathy or other focal bone abnormality. Soft tissues are unremarkable. IMPRESSION: Negative. Electronically Signed   By: Kristine Garbe M.D.   On: 10/06/2016 15:09    Micro Results    Recent Results (from the past 240 hour(s))  Blood Culture (routine x 2)     Status: None   Collection Time: 10/02/16  7:14 PM  Result Value Ref Range Status   Specimen Description BLOOD LEFT ANTECUBITAL  Final   Special Requests   Final    BOTTLES DRAWN AEROBIC AND ANAEROBIC Blood Culture adequate volume   Culture NO GROWTH 5 DAYS  Final   Report Status 10/07/2016 FINAL  Final  Blood Culture (routine x 2)     Status: None   Collection Time: 10/02/16  7:14 PM  Result Value Ref Range Status   Specimen Description BLOOD LEFT FOREARM  Final   Special Requests   Final    BOTTLES DRAWN AEROBIC AND ANAEROBIC Blood Culture adequate volume   Culture NO GROWTH 5 DAYS  Final   Report Status 10/07/2016 FINAL  Final  Urine culture     Status: Abnormal   Collection Time: 10/03/16 12:25 AM  Result Value Ref Range Status   Specimen Description URINE, RANDOM  Final   Special Requests NONE  Final    Culture (A)  Final    <10,000 COLONIES/mL INSIGNIFICANT GROWTH Performed at [facility], [place] [address], [place], [place] [address]    Report Status 10/04/2016 FINAL  Final  Culture, expectorated sputum-assessment     Status: None   Collection Time: 10/04/16  7:00 PM  Result Value Ref Range Status   Specimen Description SPUTUM  Final   Special Requests NONE  Final   Sputum evaluation THIS SPECIMEN IS ACCEPTABLE FOR SPUTUM CULTURE  Final   Report Status 10/04/2016 FINAL  Final  Culture, respiratory (NON-Expectorated)     Status: None   Collection Time: 10/04/16  7:00 PM  Result Value Ref Range Status   Specimen Description SPUTUM  Final   Special Requests NONE Reflexed from [id number]  Final   Gram Stain   Final    MODERATE WBC PRESENT,BOTH PMN AND MONONUCLEAR NO ORGANISMS SEEN    Culture   Final    Consistent with normal respiratory flora. Performed at [facility], [place] [address] [place], [place] [address]    Report Status 10/07/2016 FINAL  Final       Today   Subjective:   Dennis Serrano  stable Objective:   Blood pressure (!) 146/69, pulse 85, temperature 98.2 F (36.8 C), temperature source Oral, resp. rate 16, height 5\' 7"  (1.702 m), weight 70.1 kg (154 lb 9.6 oz), SpO2 92 %.   Intake/Output Summary (Last 24 hours) at 10/07/16 1424 Last data filed at 10/07/16 1417  Gross per 24 hour  Intake              770 ml  Output             2500 ml  Net            -1730 ml    Exam Awake Alert, Oriented x 3, No new F.N deficits, Normal affect [place].AT,PERRAL Supple Neck,No JVD, No cervical lymphadenopathy appriciated.  Symmetrical Chest wall movement, Good air movement bilaterally, CTAB RRR,No Gallops,Rubs or new Murmurs, No Parasternal Heave +ve B.Sounds, Abd Soft, Non tender, No organomegaly appriciated, No rebound -guarding or rigidity. No Cyanosis, Clubbing or edema, No new Rash or bruise  Data Review   CBC w Diff:  Lab Results  Component Value Date    WBC 5.2 10/06/2016   HGB 11.8 (L) 10/06/2016   HCT 34.6 (L) 10/06/2016   PLT 282 10/06/2016   LYMPHOPCT 4 10/02/2016  MONOPCT 3 10/02/2016   EOSPCT 0 10/02/2016   BASOPCT 0 10/02/2016    CMP:  Lab Results  Component Value Date   NA 138 10/03/2016   K 3.8 10/03/2016   CL 105 10/03/2016   CO2 23 10/03/2016   BUN 15 10/03/2016   CREATININE 0.75 10/03/2016   PROT 7.5 10/02/2016   ALBUMIN 3.6 10/02/2016   BILITOT 0.8 10/02/2016   ALKPHOS 100 10/02/2016   AST 41 10/02/2016   ALT 16 (L) 10/02/2016  .   Total Time in preparing paper work, data evaluation and todays exam - 75 minutes  Kinleigh Nault M.D on 10/07/2016 at 2:24 PM    Note: This dictation was prepared with Dragon dictation along with smaller phrase technology. Any transcriptional errors that result from this process are unintentional.

## 2016-10-07 NOTE — Progress Notes (Signed)
Pt complaints of neck pain not relieved with prn tylenol. MD orders one time dose and if effective MD will order prn. I will continue to assess.

## 2016-10-07 NOTE — Plan of Care (Signed)
Problem: Safety: Goal: Ability to remain free from injury will improve Outcome: Progressing Exit alarm activated. Pt encouraged to request help with acitvity.

## 2016-10-07 NOTE — Progress Notes (Signed)
Patient ID: Dennis Serrano, male   DOB: 1932/12/21, 83 y.o.   MRN: [id number]  [employer] PROGRESS NOTE  Dennis Serrano OZD:[id number] DOB: 02-17-1933 DOA: 10/02/2016 PCP: Juline Patch, MD  HPI/Subjective: Does have neck pain on the right side today. According to wife he is been having neck pain for the past 1 month on and off. No swelling on the neck, no fever. No numbness in hands.  Objective: Vitals:   10/07/16 0609 10/07/16 0743  BP: (!) 152/80 (!) 146/69  Pulse: 87 85  Resp:  16  Temp: 98.1 F (36.7 C) 98.2 F (36.8 C)    Filed Weights   10/02/16 1908 10/02/16 2334  Weight: 69.1 kg (152 lb 5.4 oz) 70.1 kg (154 lb 9.6 oz)    ROS: Review of Systems  Constitutional: Negative for chills and fever.  HENT: Negative for hearing loss.   Eyes: Negative for blurred vision, double vision and photophobia.  Respiratory: Positive for cough and shortness of breath. Negative for hemoptysis.   Cardiovascular: Negative for chest pain, palpitations, orthopnea and leg swelling.  Gastrointestinal: Negative for abdominal pain, diarrhea and vomiting.  Genitourinary: Negative for dysuria and urgency.  Musculoskeletal: Negative for myalgias and neck pain.  Skin: Negative for rash.  Neurological: Negative for dizziness, focal weakness, seizures, weakness and headaches.  Psychiatric/Behavioral: Negative for memory loss. The patient does not have insomnia.    Exam: Physical Exam  HENT:  Nose: No mucosal edema.  Mouth/Throat: No oropharyngeal exudate.  Thrush seen on the tongue  Eyes: Conjunctivae and lids are normal. Pupils are equal, round, and reactive to light.  Neck: Carotid bruit is not present. No thyromegaly present.  Cardiovascular: S1 normal, S2 normal and normal heart sounds.   Respiratory: He has no decreased breath sounds. He has no wheezes. He has no rhonchi. He has no rales.  GI: Soft. Bowel sounds are normal. There is no tenderness.  Musculoskeletal:       Right  ankle: He exhibits no swelling.       Left ankle: He exhibits no swelling.       Feet:  Neurological: He is alert.  Patient moves all of his extremities on his own. Very agitated in the bed. But does answer questions.  Skin: Skin is warm. Nails show no clubbing.  Scabs seen on legs bilaterally.  Psychiatric: He is not agitated.      Data Reviewed: Basic Metabolic Panel:  Recent Labs Lab 10/02/16 1914 10/03/16 0513  NA 137 138  K 3.8 3.8  CL 99* 105  CO2 29 23  GLUCOSE 346* 377*  BUN 16 15  CREATININE 1.12 0.75  CALCIUM 9.2 8.7*   Liver Function Tests:  Recent Labs Lab 10/02/16 1914  AST 41  ALT 16*  ALKPHOS 100  BILITOT 0.8  PROT 7.5  ALBUMIN 3.6   CBC:  Recent Labs Lab 10/02/16 1914 10/03/16 0513 10/06/16 0555  WBC 17.2* 14.2* 5.2  NEUTROABS 15.8*  --   --   HGB 13.5 12.2* 11.8*  HCT 39.9* 36.4* 34.6*  MCV 84.8 84.3 84.0  PLT 245 219 282     Recent Results (from the past 240 hour(s))  Blood Culture (routine x 2)     Status: None   Collection Time: 10/02/16  7:14 PM  Result Value Ref Range Status   Specimen Description BLOOD LEFT ANTECUBITAL  Final   Special Requests   Final    BOTTLES DRAWN AEROBIC AND ANAEROBIC Blood Culture adequate volume  Culture NO GROWTH 5 DAYS  Final   Report Status 10/07/2016 FINAL  Final  Blood Culture (routine x 2)     Status: None   Collection Time: 10/02/16  7:14 PM  Result Value Ref Range Status   Specimen Description BLOOD LEFT FOREARM  Final   Special Requests   Final    BOTTLES DRAWN AEROBIC AND ANAEROBIC Blood Culture adequate volume   Culture NO GROWTH 5 DAYS  Final   Report Status 10/07/2016 FINAL  Final  Urine culture     Status: Abnormal   Collection Time: 10/03/16 12:25 AM  Result Value Ref Range Status   Specimen Description URINE, RANDOM  Final   Special Requests NONE  Final   Culture (A)  Final    <10,000 COLONIES/mL INSIGNIFICANT GROWTH Performed at [facility], [place] [address]  [place], [place] [address]    Report Status 10/04/2016 FINAL  Final  Culture, expectorated sputum-assessment     Status: None   Collection Time: 10/04/16  7:00 PM  Result Value Ref Range Status   Specimen Description SPUTUM  Final   Special Requests NONE  Final   Sputum evaluation THIS SPECIMEN IS ACCEPTABLE FOR SPUTUM CULTURE  Final   Report Status 10/04/2016 FINAL  Final  Culture, respiratory (NON-Expectorated)     Status: None   Collection Time: 10/04/16  7:00 PM  Result Value Ref Range Status   Specimen Description SPUTUM  Final   Special Requests NONE Reflexed from [id number]  Final   Gram Stain   Final    MODERATE WBC PRESENT,BOTH PMN AND MONONUCLEAR NO ORGANISMS SEEN    Culture   Final    Consistent with normal respiratory flora. Performed at [facility], [place] [address], [place], [place] [address]    Report Status 10/07/2016 FINAL  Final     Studies: Dg Foot 2 Views Right  Result Date: 10/06/2016 CLINICAL DATA:  83 y/o  M; 2 days of foot pain.  No injury. EXAM: RIGHT FOOT - 2 VIEW COMPARISON:  None. FINDINGS: There is no evidence of fracture or dislocation. There is no evidence of arthropathy or other focal bone abnormality. Soft tissues are unremarkable. IMPRESSION: Negative. Electronically Signed   By: Kristine Garbe M.D.   On: 10/06/2016 15:09    Scheduled Meds: . amLODipine  5 mg Oral Daily  . aspirin EC  81 mg Oral Daily  . azithromycin  500 mg Intravenous Q24H  . carbidopa-levodopa  1 tablet Oral TID  . cefTRIAXone (ROCEPHIN)  IV  1 g Intravenous Q24H  . enoxaparin (LOVENOX) injection  40 mg Subcutaneous Q24H  . insulin aspart  0-5 Units Subcutaneous QHS  . insulin aspart  0-9 Units Subcutaneous TID WC  . insulin detemir  12 Units Subcutaneous QHS  . insulin detemir  12 Units Subcutaneous QAC breakfast  . loratadine  10 mg Oral Daily  . nystatin  5 mL Oral QID  . pantoprazole  40 mg Oral Daily  . QUEtiapine  25 mg Oral QHS  . tamsulosin  0.4 mg Oral  Daily   Continuous Infusions:   Assessment/Plan:  1. Clinical sepsis secondary to pneumonia. Patient is clinically better, O2 saturation improved to 95% on 2 L, 88% on room air. Continue oxygen, antibiotics, likely discharge over the weekend, discussed the plan with patient's family. 2.  3. Dementia with behavioral disturbance.  On Seroquel ,  4. Essential hypertension on Norvasc 5. Type 2 diabetes mellitus; controlled. Continue present dose  of insulin. 6. Thrush on nystatin 7. BPH on Flomax 8. Parkinson's disease on Sinemet 9. Hyperlipidemia ; statins 10. GERD on PPI 11. History of coronary artery disease 12. Weakness and falls. Physical therapy recommended SNF New onset afib with RVR; heart rate improved with by mouth Cardizem only. echo cardiogram showed EF more than 50%., not a candidate for full dos A c because of dementia and high risk for falls. Continue aspirin alone 14. Right foot pain likely plantars fasciitis:  Right foot x-rays are normal. D/w family; #5 musculoskeletal neck pain: At the Skelaxin, continue Tylenol. Avoid narcotics because of dementia. Code Status:     Code Status Orders        Start     Ordered   10/02/16 2325  Full code  Continuous     10/02/16 2324    Code Status History    Date Active Date Inactive Code Status Order ID Comments User Context   This patient has a current code status but no historical code status.    Advance Directive Documentation     Most Recent Value  Type of Advance Directive  Healthcare Power of Attorney  Pre-existing out of facility DNR order (yellow form or pink MOST form)  -  "MOST" Form in Place?  -     Family Communication: Spoke with wife  at the bedside Disposition Plan: SNF  Antibiotics:  Rocephin  Zithromax  Time spent: 28 minutes  [employer]

## 2016-10-08 LAB — GLUCOSE, CAPILLARY
GLUCOSE-CAPILLARY: 131 mg/dL — AB (ref 65–99)
Glucose-Capillary: 132 mg/dL — ABNORMAL HIGH (ref 65–99)

## 2016-10-08 MED ORDER — DILTIAZEM HCL 60 MG PO TABS: 60.0000 mg | ORAL_TABLET | Freq: Four times a day (QID) | ORAL | 0 refills | Status: DC

## 2016-10-08 MED ORDER — ASPIRIN 325 MG PO TABS: 325.0000 mg | ORAL_TABLET | Freq: Every day | ORAL | 0 refills | Status: AC

## 2016-10-08 MED ORDER — DOXYCYCLINE HYCLATE 50 MG PO CAPS: 100.0000 mg | ORAL_CAPSULE | Freq: Two times a day (BID) | ORAL | 0 refills | Status: DC

## 2016-10-08 NOTE — Progress Notes (Signed)
Pt to be discharged to [facility] today. Iv and tele removed. Report called to theresa at the facility. Pt to be transported on 2 l 02 by ems after lunch.

## 2016-10-08 NOTE — Discharge Summary (Signed)
Dennis Serrano, is a 83 y.o. male  DOB December 14, 1932  MRN [id number].  Admission date:  10/02/2016  Admitting Physician  Lance Coon, MD  Discharge Date:  10/08/2016   Primary MD  Juline Patch, MD  Recommendations for primary care physician for things to follow:   Follow-up with primary doctor in 1 week   Admission Diagnosis  Sepsis, due to unspecified organism ([place]) [A41.9] Community acquired pneumonia, unspecified laterality [J18.9]   Discharge Diagnosis  Sepsis, due to unspecified organism ([place]) [A41.9] Community acquired pneumonia, unspecified laterality [J18.9]    Principal Problem:   Sepsis ([place]) Active Problems:   CAP (community acquired pneumonia)   HTN (hypertension)   HLD (hyperlipidemia)   CAD (coronary artery disease)   GERD (gastroesophageal reflux disease)   Pressure injury of skin      Past Medical History:  Diagnosis Date  . Anxiety   . CAD (coronary artery disease)   . Dementia   . Diabetes mellitus without complication ([place])   . GERD (gastroesophageal reflux disease)   . History of heart artery stent   . HLD (hyperlipidemia)   . HTN (hypertension)   . Parkinson disease [facility])     Past Surgical History:  Procedure Laterality Date  . APPENDECTOMY    . CORONARY STENT PLACEMENT    . EYE SURGERY    . TRANSURETHRAL RESECTION OF PROSTATE         History of present illness and  Hospital Course:     Kindly see H&P for history of present illness and admission details, please review complete Labs, Consult reports and Test reports for all details in brief  HPI  from the history and physical done on the day of admission  83 year old male patient admitted because of increasing confusion, cough and admitted and found to have pneumonia.   Hospital Course  1 sepsis present on admission  secondary to pneumonia, lactic acid was normal. Patient received broad-spectrum IV antibiotics, blood cultures have been negative, presently on Rocephin, Zithromax, WBC decreased from 17.2 30.2.  #2 acute respiratory failure secondary to pneumonia: Patient breathing better with less wheezing, still living 2 L of oxygen, with oxygen saturation dropping to below 88%. So he needs to go to a nursing home with 2 L Oxygen.  #3 multiple falls, physical therapy recommended skilled nursing.  #4 .new-onset atrial fibrillation: Patient developed a A. fib with RVR 2 days ago, transferred him to telemetry, started on Cardizem, did not start low-dose anticoagulation because of an full falls, high risk for bleeds, continue aspirin alone, heart rate controlled with Cardizem. Echocardiogram showed EF more than 55%.  5.parkinson dementia; Continue Sinemet #6 BPH continue Flomax #7, diabetes mellitus type 2: Patient started on lantus 12 units sq bid,stopped metformin   Discharge Condition: stable   Follow UP  Contact information for after-discharge care    [facility] .   Specialty:  [place] information: [facility] [place] [phone]                Discharge Instructions  and  Discharge Medications      Allergies as of 10/08/2016   No Known Allergies     Medication List    STOP taking these medications   amLODipine 10 MG tablet Commonly known as:  NORVASC   insulin NPH Human 100 UNIT/ML injection Commonly known as:  HUMULIN N,NOVOLIN N   metFORMIN 1000 MG tablet Commonly  known as:  GLUCOPHAGE   NORVASC PO     TAKE these medications   albuterol 108 (90 Base) MCG/ACT inhaler Commonly known as:  PROVENTIL HFA;VENTOLIN HFA Inhale 2 puffs into the lungs every 6 (six) hours as needed for wheezing or shortness of breath.   amoxicillin-clavulanate 875-125 MG tablet Commonly known  as:  AUGMENTIN Take 1 tablet by mouth 2 (two) times daily.   aspirin 325 MG tablet Take 1 tablet (325 mg total) by mouth daily.   carbidopa-levodopa 25-100 MG tablet Commonly known as:  SINEMET IR Take 1 tablet by mouth 3 (three) times daily.   cyclobenzaprine 5 MG tablet Commonly known as:  FLEXERIL Take 5 mg by mouth 3 (three) times daily as needed for muscle spasms.   diltiazem 60 MG tablet Commonly known as:  CARDIZEM Take 1 tablet (60 mg total) by mouth every 6 (six) hours.   doxycycline 50 MG capsule Commonly known as:  VIBRAMYCIN Take 2 capsules (100 mg total) by mouth 2 (two) times daily.   gabapentin 300 MG capsule Commonly known as:  NEURONTIN Take 300 mg by mouth 2 (two) times daily.   insulin detemir 100 UNIT/ML injection Commonly known as:  LEVEMIR Inject 0.12 mLs (12 Units total) into the skin at bedtime.   insulin detemir 100 UNIT/ML injection Commonly known as:  LEVEMIR Inject 0.12 mLs (12 Units total) into the skin daily before breakfast.   metaxalone 400 MG tablet Commonly known as:  SKELAXIN Take 1 tablet (400 mg total) by mouth 3 (three) times daily.   omeprazole 20 MG capsule Commonly known as:  PRILOSEC Take 20 mg by mouth daily.   ondansetron 4 MG tablet Commonly known as:  ZOFRAN Take 4 mg by mouth daily as needed for nausea.   QUEtiapine 25 MG tablet Commonly known as:  SEROQUEL Take 1 tablet (25 mg total) by mouth at bedtime.   spironolactone 50 MG tablet Commonly known as:  ALDACTONE Take 50 mg by mouth daily.   tamsulosin 0.4 MG Caps capsule Commonly known as:  FLOMAX Take 0.4 mg by mouth daily.         Diet and Activity recommendation: See Discharge Instructions above   Consults obtained -PT   Major procedures and Radiology Reports - PLEASE review detailed and final reports for all details, in brief -     Dg Chest Port 1 View  Result Date: 10/02/2016 CLINICAL DATA:  Cough and weakness for several days.  Sepsis.  EXAM: PORTABLE CHEST 1 VIEW COMPARISON:  None. FINDINGS: The heart size and mediastinal contours are within normal limits. Aortic atherosclerosis. Both lungs are clear. The visualized skeletal structures are unremarkable. Pulmonary opacity is seen in the left retrocardiac lung base, suspicious for pneumonia. Right lung is clear. IMPRESSION: Left retrocardiac opacity, suspicious for pneumonia. Recommend post-treatment followup PA and lateral chest X-ray in several weeks to ensure resolution. Electronically Signed   By: Earle Gell M.D.   On: 10/02/2016 19:50   Dg Foot 2 Views Right  Result Date: 10/06/2016 CLINICAL DATA:  83 y/o  M; 2 days of foot pain.  No injury. EXAM: RIGHT FOOT - 2 VIEW COMPARISON:  None. FINDINGS: There is no evidence of fracture or dislocation. There is no evidence of arthropathy or other focal bone abnormality. Soft tissues are unremarkable. IMPRESSION: Negative. Electronically Signed   By: Kristine Garbe M.D.   On: 10/06/2016 15:09    Micro Results    Recent Results (from the past 240 hour(s))  Blood Culture (routine x 2)     Status: None   Collection Time: 10/02/16  7:14 PM  Result Value Ref Range Status   Specimen Description BLOOD LEFT ANTECUBITAL  Final   Special Requests   Final    BOTTLES DRAWN AEROBIC AND ANAEROBIC Blood Culture adequate volume   Culture NO GROWTH 5 DAYS  Final   Report Status 10/07/2016 FINAL  Final  Blood Culture (routine x 2)     Status: None   Collection Time: 10/02/16  7:14 PM  Result Value Ref Range Status   Specimen Description BLOOD LEFT FOREARM  Final   Special Requests   Final    BOTTLES DRAWN AEROBIC AND ANAEROBIC Blood Culture adequate volume   Culture NO GROWTH 5 DAYS  Final   Report Status 10/07/2016 FINAL  Final  Urine culture     Status: Abnormal   Collection Time: 10/03/16 12:25 AM  Result Value Ref Range Status   Specimen Description URINE, RANDOM  Final   Special Requests NONE  Final   Culture (A)  Final     <10,000 COLONIES/mL INSIGNIFICANT GROWTH Performed at [facility], [place] [address] [place], [place] [address]    Report Status 10/04/2016 FINAL  Final  Culture, expectorated sputum-assessment     Status: None   Collection Time: 10/04/16  7:00 PM  Result Value Ref Range Status   Specimen Description SPUTUM  Final   Special Requests NONE  Final   Sputum evaluation THIS SPECIMEN IS ACCEPTABLE FOR SPUTUM CULTURE  Final   Report Status 10/04/2016 FINAL  Final  Culture, respiratory (NON-Expectorated)     Status: None   Collection Time: 10/04/16  7:00 PM  Result Value Ref Range Status   Specimen Description SPUTUM  Final   Special Requests NONE Reflexed from [id number]  Final   Gram Stain   Final    MODERATE WBC PRESENT,BOTH PMN AND MONONUCLEAR NO ORGANISMS SEEN    Culture   Final    Consistent with normal respiratory flora. Performed at [facility], [place] [address], [place], [place] [address]    Report Status 10/07/2016 FINAL  Final       Today   Subjective:   Dennis Serrano  stable Objective:   Blood pressure (!) 130/57, pulse 77, temperature 97.8 F (36.6 C), temperature source Axillary, resp. rate 18, height 5\' 7"  (1.702 m), weight 70.1 kg (154 lb 9.6 oz), SpO2 90 %.   Intake/Output Summary (Last 24 hours) at 10/08/16 1008 Last data filed at 10/08/16 0523  Gross per 24 hour  Intake              600 ml  Output             4000 ml  Net            -3400 ml    Exam Awake Alert, Oriented x 3, No new F.N deficits, Normal affect [place].AT,PERRAL Supple Neck,No JVD, No cervical lymphadenopathy appriciated.  Symmetrical Chest wall movement, Good air movement bilaterally, CTAB RRR,No Gallops,Rubs or new Murmurs, No Parasternal Heave +ve B.Sounds, Abd Soft, Non tender, No organomegaly appriciated, No rebound -guarding or rigidity. No Cyanosis, Clubbing or edema, No new Rash or bruise  Data Review   CBC w Diff:  Lab Results  Component Value Date   WBC 5.2  10/06/2016   HGB 11.8 (L) 10/06/2016   HCT 34.6 (L) 10/06/2016   PLT 282 10/06/2016   LYMPHOPCT 4 10/02/2016  MONOPCT 3 10/02/2016   EOSPCT 0 10/02/2016   BASOPCT 0 10/02/2016    CMP:  Lab Results  Component Value Date   NA 138 10/03/2016   K 3.8 10/03/2016   CL 105 10/03/2016   CO2 23 10/03/2016   BUN 15 10/03/2016   CREATININE 0.75 10/03/2016   PROT 7.5 10/02/2016   ALBUMIN 3.6 10/02/2016   BILITOT 0.8 10/02/2016   ALKPHOS 100 10/02/2016   AST 41 10/02/2016   ALT 16 (L) 10/02/2016  .   Total Time in preparing paper work, data evaluation and todays exam - 63 minutes  Terell Kincy M.D on 10/08/2016 at 10:08 AM    Note: This dictation was prepared with Dragon dictation along with smaller phrase technology. Any transcriptional errors that result from this process are unintentional.

## 2016-10-08 NOTE — Clinical Social Work Note (Signed)
Patient to dc to [facility] today via non emergent EMS. The family and facility are aware and in agreement. CSW will con't to follow pending additional dc needs.  Santiago Bumpers, MSW, Latanya Presser [phone]

## 2016-10-11 DIAGNOSIS — G20A1 Parkinson's disease without dyskinesia, without mention of fluctuations: Secondary | ICD-10-CM | POA: Insufficient documentation

## 2016-10-11 DIAGNOSIS — I4819 Other persistent atrial fibrillation: Secondary | ICD-10-CM | POA: Insufficient documentation

## 2016-10-11 DIAGNOSIS — E119 Type 2 diabetes mellitus without complications: Secondary | ICD-10-CM | POA: Insufficient documentation

## 2016-10-20 ENCOUNTER — Emergency Department: Payer: Medicare Other

## 2016-10-20 ENCOUNTER — Observation Stay
Admission: EM | Admit: 2016-10-20 | Discharge: 2016-10-21 | Disposition: A | Discharge status: Skilled Nursing Facility | Payer: Medicare Other | Attending: Internal Medicine | Admitting: Internal Medicine

## 2016-10-20 ENCOUNTER — Encounter: Payer: Self-pay | Admitting: Emergency Medicine

## 2016-10-20 DIAGNOSIS — G20 Parkinson's disease: Secondary | ICD-10-CM | POA: Insufficient documentation

## 2016-10-20 DIAGNOSIS — E119 Type 2 diabetes mellitus without complications: Secondary | ICD-10-CM | POA: Diagnosis not present

## 2016-10-20 DIAGNOSIS — Z79899 Other long term (current) drug therapy: Secondary | ICD-10-CM | POA: Insufficient documentation

## 2016-10-20 DIAGNOSIS — I2510 Atherosclerotic heart disease of native coronary artery without angina pectoris: Secondary | ICD-10-CM | POA: Diagnosis not present

## 2016-10-20 DIAGNOSIS — G459 Transient cerebral ischemic attack, unspecified: Secondary | ICD-10-CM | POA: Diagnosis not present

## 2016-10-20 DIAGNOSIS — R4701 Aphasia: Secondary | ICD-10-CM | POA: Insufficient documentation

## 2016-10-20 DIAGNOSIS — Z87891 Personal history of nicotine dependence: Secondary | ICD-10-CM | POA: Insufficient documentation

## 2016-10-20 DIAGNOSIS — R4781 Slurred speech: Secondary | ICD-10-CM | POA: Diagnosis not present

## 2016-10-20 DIAGNOSIS — E785 Hyperlipidemia, unspecified: Secondary | ICD-10-CM | POA: Insufficient documentation

## 2016-10-20 DIAGNOSIS — Z8249 Family history of ischemic heart disease and other diseases of the circulatory system: Secondary | ICD-10-CM | POA: Diagnosis not present

## 2016-10-20 DIAGNOSIS — Z794 Long term (current) use of insulin: Secondary | ICD-10-CM | POA: Diagnosis not present

## 2016-10-20 DIAGNOSIS — Z8546 Personal history of malignant neoplasm of prostate: Secondary | ICD-10-CM | POA: Insufficient documentation

## 2016-10-20 DIAGNOSIS — I10 Essential (primary) hypertension: Secondary | ICD-10-CM | POA: Insufficient documentation

## 2016-10-20 DIAGNOSIS — F0280 Dementia in other diseases classified elsewhere without behavioral disturbance: Secondary | ICD-10-CM | POA: Insufficient documentation

## 2016-10-20 DIAGNOSIS — F419 Anxiety disorder, unspecified: Secondary | ICD-10-CM | POA: Diagnosis not present

## 2016-10-20 DIAGNOSIS — E871 Hypo-osmolality and hyponatremia: Secondary | ICD-10-CM | POA: Diagnosis not present

## 2016-10-20 DIAGNOSIS — R296 Repeated falls: Secondary | ICD-10-CM | POA: Insufficient documentation

## 2016-10-20 DIAGNOSIS — Z955 Presence of coronary angioplasty implant and graft: Secondary | ICD-10-CM | POA: Insufficient documentation

## 2016-10-20 DIAGNOSIS — Z7982 Long term (current) use of aspirin: Secondary | ICD-10-CM | POA: Diagnosis not present

## 2016-10-20 DIAGNOSIS — K219 Gastro-esophageal reflux disease without esophagitis: Secondary | ICD-10-CM | POA: Insufficient documentation

## 2016-10-20 HISTORY — DX: Malignant neoplasm of prostate: C61

## 2016-10-20 LAB — COMPREHENSIVE METABOLIC PANEL
ALBUMIN: 3.7 g/dL (ref 3.5–5.0)
ALK PHOS: 75 U/L (ref 38–126)
ALT: 12 U/L — ABNORMAL LOW (ref 17–63)
ANION GAP: 8 (ref 5–15)
AST: 33 U/L (ref 15–41)
BILIRUBIN TOTAL: 0.6 mg/dL (ref 0.3–1.2)
BUN: 18 mg/dL (ref 6–20)
CALCIUM: 9.3 mg/dL (ref 8.9–10.3)
CO2: 28 mmol/L (ref 22–32)
Chloride: 95 mmol/L — ABNORMAL LOW (ref 101–111)
Creatinine, Ser: 0.86 mg/dL (ref 0.61–1.24)
GFR calc Af Amer: >60 mL/min (ref >60)
GFR calc non Af Amer: >60 mL/min (ref >60)
GLUCOSE: 144 mg/dL — AB (ref 65–99)
POTASSIUM: 4.4 mmol/L (ref 3.5–5.1)
Sodium: 131 mmol/L — ABNORMAL LOW (ref 135–145)
TOTAL PROTEIN: 7.6 g/dL (ref 6.5–8.1)

## 2016-10-20 LAB — URINALYSIS, COMPLETE (UACMP) WITH MICROSCOPIC
BACTERIA UA: NONE SEEN
BILIRUBIN URINE: NEGATIVE
Glucose, UA: NEGATIVE mg/dL
Hgb urine dipstick: NEGATIVE
KETONES UR: NEGATIVE mg/dL
LEUKOCYTES UA: NEGATIVE
NITRITE: NEGATIVE
PROTEIN: 30 mg/dL — AB
RBC / HPF: NONE SEEN RBC/hpf (ref 0–5)
Specific Gravity, Urine: 1.011 (ref 1.005–1.030)
Squamous Epithelial / LPF: NONE SEEN
pH: 6 (ref 5.0–8.0)

## 2016-10-20 LAB — CBC
HEMATOCRIT: 36.1 % — AB (ref 40.0–52.0)
Hemoglobin: 12.1 g/dL — ABNORMAL LOW (ref 13.0–18.0)
MCH: 28.2 pg (ref 26.0–34.0)
MCHC: 33.5 g/dL (ref 32.0–36.0)
MCV: 84.1 fL (ref 80.0–100.0)
Platelets: 552 10*3/uL — ABNORMAL HIGH (ref 150–440)
RBC: 4.29 MIL/uL — ABNORMAL LOW (ref 4.40–5.90)
RDW: 15.5 % — AB (ref 11.5–14.5)
WBC: 9 10*3/uL (ref 3.8–10.6)

## 2016-10-20 LAB — AMMONIA: Ammonia: 11 umol/L (ref 9–35)

## 2016-10-20 LAB — GLUCOSE, CAPILLARY
GLUCOSE-CAPILLARY: 122 mg/dL — AB (ref 65–99)
Glucose-Capillary: 129 mg/dL — ABNORMAL HIGH (ref 65–99)

## 2016-10-20 MED ORDER — ASPIRIN 300 MG RE SUPP: 300.0000 mg | Freq: Every day | RECTAL | Status: DC

## 2016-10-20 MED ORDER — INSULIN ASPART 100 UNIT/ML ~~LOC~~ SOLN: 0.0000 [IU] | Freq: Every day | SUBCUTANEOUS | Status: DC

## 2016-10-20 MED ORDER — SODIUM CHLORIDE 0.9 % IV SOLN
INTRAVENOUS | Status: DC
  Administered 2016-10-21 (×2): via INTRAVENOUS

## 2016-10-20 MED ORDER — ACETAMINOPHEN 325 MG PO TABS
650.0000 mg | ORAL_TABLET | Freq: Once | ORAL | Status: AC
  Administered 2016-10-20: 650 mg via ORAL

## 2016-10-20 MED ORDER — ACETAMINOPHEN 160 MG/5ML PO SOLN: 650.0000 mg | ORAL | Status: DC | PRN

## 2016-10-20 MED ORDER — ACETAMINOPHEN 650 MG RE SUPP: 650.0000 mg | RECTAL | Status: DC | PRN

## 2016-10-20 MED ORDER — ALBUTEROL SULFATE (2.5 MG/3ML) 0.083% IN NEBU: 2.5000 mg | INHALATION_SOLUTION | Freq: Four times a day (QID) | RESPIRATORY_TRACT | Status: DC | PRN

## 2016-10-20 MED ORDER — INSULIN ASPART 100 UNIT/ML ~~LOC~~ SOLN
0.0000 [IU] | Freq: Three times a day (TID) | SUBCUTANEOUS | Status: DC
  Administered 2016-10-21: 12:00:00 2 [IU] via SUBCUTANEOUS
  Administered 2016-10-21: 08:00:00 1 [IU] via SUBCUTANEOUS
  Filled 2016-10-20: qty 1
  Filled 2016-10-20: qty 2

## 2016-10-20 MED ORDER — ASPIRIN 325 MG PO TABS
325.0000 mg | ORAL_TABLET | Freq: Every day | ORAL | Status: DC
  Administered 2016-10-21: 325 mg via ORAL
  Filled 2016-10-20: qty 1

## 2016-10-20 MED ORDER — SODIUM CHLORIDE 0.9 % IV BOLUS (SEPSIS)
1000.0000 mL | Freq: Once | INTRAVENOUS | Status: AC
  Administered 2016-10-20: 1000 mL via INTRAVENOUS

## 2016-10-20 MED ORDER — ENOXAPARIN SODIUM 40 MG/0.4ML ~~LOC~~ SOLN
40.0000 mg | SUBCUTANEOUS | Status: DC
  Administered 2016-10-21: 40 mg via SUBCUTANEOUS
  Filled 2016-10-20: qty 0.4

## 2016-10-20 MED ORDER — ACETAMINOPHEN 325 MG PO TABS: 650.0000 mg | ORAL_TABLET | ORAL | Status: DC | PRN

## 2016-10-20 MED ORDER — ACETAMINOPHEN 325 MG PO TABS
ORAL_TABLET | ORAL | Status: AC
  Administered 2016-10-20: 650 mg via ORAL
  Filled 2016-10-20: qty 2

## 2016-10-20 MED ORDER — STROKE: EARLY STAGES OF RECOVERY BOOK
Freq: Once | Status: AC
  Administered 2016-10-20: 1

## 2016-10-20 NOTE — ED Triage Notes (Signed)
Pt presents to ED via [facility] from [facility] c/o unwitnessed fall. Pt states he is not sure what happened or if he hit his head. C/o headache and R lower back ache. Pt has hx dementia but daughter at bedside states pt today is showing some garbled speech that is abnormal for him.

## 2016-10-20 NOTE — ED Provider Notes (Signed)
[facility] Emergency Department Provider Note  ____________________________________________  Time seen: Approximately 7:01 PM  I have reviewed the triage vital signs and the nursing notes.   HISTORY  Chief Complaint Fall and Altered Mental Status  This patient's history is limited due to his dementia.  HPI Dennis Serrano is a 83 y.o. male at [facility] discharged after pneumonia with sepsis 10/08/16, history of Parkinson's and dementia, HTN, CAD, presenting with a fall and expressive aphasia. The patient had an unwitnessed fall today; he is unable to tolerate about the fall. The patient's daughter is here, who states she saw him yesterday and he was at his normal baseline, which is demented but able to converse normally. Today, she notes that his words are intermittently normal interspersed with "g[place]." Otherwise, the patient denies any acute pain, and he and his daughter deny any acute illness or changes in his medications other than what was changed after his admission.   Past Medical History:  Diagnosis Date  . Anxiety   . CAD (coronary artery disease)   . Dementia   . Diabetes mellitus without complication ([place])   . GERD (gastroesophageal reflux disease)   . History of heart artery stent   . HLD (hyperlipidemia)   . HTN (hypertension)   . Parkinson disease [facility])     Patient Active Problem List   Diagnosis Date Noted  . Pressure injury of skin 10/07/2016  . Sepsis ([place]) 10/02/2016  . CAP (community acquired pneumonia) 10/02/2016  . HTN (hypertension) 10/02/2016  . HLD (hyperlipidemia) 10/02/2016  . CAD (coronary artery disease) 10/02/2016  . GERD (gastroesophageal reflux disease) 10/02/2016    Past Surgical History:  Procedure Laterality Date  . APPENDECTOMY    . CORONARY STENT PLACEMENT    . EYE SURGERY    . TRANSURETHRAL RESECTION OF PROSTATE      Current Outpatient Rx  . Order #: [id number] Class: Normal  .  Order #: [id number] Class: Normal  . Order #: [id number] Class: Historical Med  . Order #: [id number] Class: Historical Med  . Order #: [id number] Class: Normal  . Order #: [id number] Class: Historical Med  . Order #: [id number] Class: Normal  . Order #: [id number] Class: Normal  . Order #: [id number] Class: Normal  . Order #: [id number] Class: Historical Med  . Order #: [id number] Class: Historical Med  . Order #: [id number] Class: Historical Med  . Order #: [id number] Class: Normal  . Order #: [id number] Class: Historical Med  . Order #: [id number] Class: Historical Med  . Order #: [id number] Class: Normal  . Order #: [id number] Class: Normal    Allergies Patient has no known allergies.  Family History  Problem Relation Age of Onset  . Heart disease Mother   . Heart disease Father   . Cancer Sister   . Cancer Brother     Social History Social History  Substance Use Topics  . Smoking status: Former Smoker    Types: Cigarettes    Quit date: 25  . Smokeless tobacco: Never Used  . Alcohol use No    Review of Systems Unable to obtain due to patient dementia.  ____________________________________________   PHYSICAL EXAM:  VITAL SIGNS: ED Triage Vitals [10/20/16 1847]  Enc Vitals Group     BP (!) 160/75     Pulse Rate 65     Resp 18     Temp 97.9 F (36.6 C)     Temp Source Oral     SpO2 100 %     Weight 155 lb (70.3 kg)  Height 5\' 7"  (1.702 m)     Head Circumference      Peak Flow      Pain Score      Pain Loc      Pain Edu?      Excl. in [place]?     Constitutional: The patient is alert, answering questions appropriately, knows that he is at the hospital. However, he intermittently has bouts of expressive aphasia where nonsensical sounds and words are said. She is comfortable appearing, moves about comfortably in the stretcher, and is nontoxic. Eyes: Conjunctivae are normal.  EOMI. PERRLA. No scleral icterus. Head: Atraumatic. The patient has some blood on his forehead, but there is  no underlying abrasion and I think this is from his skin tears on his arms. He has no raccoon eyes or Battle sign. Nose: No congestion/rhinnorhea. Swelling over the nose. No septal hematoma. Mouth/Throat: Mucous membranes are moist. No malocclusion or dental injury. Neck: No stridor.  Supple.  No midline C-spine tenderness to palpation, step-offs or deformities. Full range of motion without pain. Cardiovascular: Normal rate, regular rhythm. No murmurs, rubs or gallops.  Respiratory: Normal respiratory effort.  No accessory muscle use or retractions. Lungs CTAB.  No wheezes, rales or ronchi. Gastrointestinal: Soft, nontender and nondistended.  No guarding or rebound.  No peritoneal signs. Musculoskeletal: Pelvis is stable. The patient has full range of motion of the bilateral wrists, elbows, shoulders, ankles, knees, and hips without pain. No LE edema. No ttp in the calves or palpable cords.  Negative Homan's sign. Neurologic:  A&O to person and place.  Speech is intermittently clear and intermittently has expressive aphasia..  Face and smile are symmetric.  EOMI.  PERRLA. Moves all extremities well. 5 out of 5 grip strength, biceps and triceps, hip flexion. Skin:  Skin is warm, dry. Positive superficial hemostatic skin tear over the right elbow and proximal left forearm.Marland Kitchen Psychiatric: Mood and affect are normal.  ____________________________________________   LABS (all labs ordered are listed, but only abnormal results are displayed)  Labs Reviewed  COMPREHENSIVE METABOLIC PANEL - Abnormal; Notable for the following:       Result Value   Sodium 131 (*)    Chloride 95 (*)    Glucose, Bld 144 (*)    ALT 12 (*)    All other components within normal limits  CBC - Abnormal; Notable for the following:    RBC 4.29 (*)    Hemoglobin 12.1 (*)    HCT 36.1 (*)    RDW 15.5 (*)    Platelets 552 (*)    All other components within normal limits  URINALYSIS, COMPLETE (UACMP) WITH MICROSCOPIC -  Abnormal; Notable for the following:    Color, Urine YELLOW (*)    APPearance CLEAR (*)    Protein, ur 30 (*)    All other components within normal limits  AMMONIA   ____________________________________________  EKG  ED ECG REPORT I, Eula Listen, the attending physician, personally viewed and interpreted this ECG.   Date: 10/20/2016  EKG Time: 1840  Rate: 67  Rhythm: normal sinus rhythm  Axis: leftward  Intervals:first-degree A-V block   ST&T Change: Nonspecific T-wave inversion in V1. No STEMI.  ____________________________________________  RADIOLOGY  Dg Chest 2 View  Result Date: 10/20/2016 CLINICAL DATA:  Unwitnessed fall. EXAM: CHEST  2 VIEW COMPARISON:  10/02/2016 FINDINGS: The lungs are clear wiithout focal pneumonia, edema, pneumothorax or pleural effusion. Interstitial markings are diffusely coarsened with chronic features. Skin fold noted over left upper  lobe. The cardio pericardial silhouette is enlarged. The visualized bony structures of the thorax are intact. Telemetry leads overlie the chest. IMPRESSION: No active cardiopulmonary disease. Electronically Signed   By: [name] Stanley M.D.   On: 10/20/2016 20:08   Ct Head Wo Contrast  Result Date: 10/20/2016 CLINICAL DATA:  Unwitnessed fall headache EXAM: CT HEAD WITHOUT CONTRAST TECHNIQUE: Contiguous axial images were obtained from the base of the skull through the vertex without intravenous contrast. COMPARISON:  None. FINDINGS: Brain: No acute territorial infarction, hemorrhage or focal mass lesion is visualized. Mild to moderate atrophy. Moderate periventricular and subcortical white matter small vessel ischemic changes. Ventricular prominence is felt secondary to atrophy. Probable tiny old lacunar infarcts in the basal ganglia Vascular: No hyperdense vessels.  Carotid artery calcifications. Skull: No fracture or suspicious bone lesion. Sinuses/Orbits: Fluid levels in the left maxillary and sphenoid sinuses.  Mucosal thickening in the ethmoid sinuses. No acute orbital abnormality. Bilateral lens extraction. Other: None IMPRESSION: 1. No CT evidence for acute intracranial abnormality. 2. Moderate small vessel ischemic changes of the white matter 3. Sinusitis Electronically Signed   By: Donavan Foil M.D.   On: 10/20/2016 20:03    ____________________________________________   PROCEDURES  Procedure(s) performed: None  Procedures  Critical Care performed: No ____________________________________________   INITIAL IMPRESSION / ASSESSMENT AND PLAN / ED COURSE  Pertinent labs & imaging results that were available during my care of the patient were reviewed by me and considered in my medical decision making (see chart for details).  83 y.o. male with an unwitnessed fall, history of dementia and Parkinson's, as well as recent hospitalization for pneumonia, also with expressive aphasia. Overall, this patient is not at his baseline because of his expressive aphasia. We will do stroke evaluation and evaluate for intracranial injury including bleed. Will also check his electrolytes, and other basic labs and will look for signs of infection with a chest x-ray and urinalysis. Given that the patient is not at his baseline, he'll require admission to the hospital.  ----------------------------------------- 8:22 PM on 10/20/2016 -----------------------------------------  The patient's workup in the emergency department is reassuring. His CT scan does not show any acute intracranial process, nor any injury from his fall. However, I will order an MRI of the brain for further evaluation of stroke given his ongoing expressive aphasia. His urinalysis does not show a UTI, and his chest x-ray is unremarkable. He has a baseline anemia that is unchanged. He is mildly hyponatremic, but it is unlikely that this is the cause of his symptoms today. We'll plan admission at this  time.  ____________________________________________  FINAL CLINICAL IMPRESSION(S) / ED DIAGNOSES  Final diagnoses:  Hyponatremia  Expressive aphasia  Unwitnessed fall         NEW MEDICATIONS STARTED DURING THIS VISIT:  New Prescriptions   No medications on file      Eula Listen, MD 10/20/16 2023

## 2016-10-20 NOTE — ED Notes (Signed)
Daughter reports last time pt's speech was normal was yesterday. EDP at bedside.

## 2016-10-20 NOTE — H&P (Signed)
[facility] NAME: Dennis Serrano    MR#:  [id number]  DATE OF BIRTH:  1932/12/01  DATE OF ADMISSION:  10/20/2016  PRIMARY CARE PHYSICIAN: Juline Patch, MD   REQUESTING/REFERRING PHYSICIAN: Eula Listen, MD  CHIEF COMPLAINT:   Fall and altered mental status HISTORY OF PRESENT ILLNESS:  Dennis Serrano  is a 83 y.o. male with a known history of Parkinson's dementia, hypertension, coronary artery disease recently admitted to the hospital for pneumonia with sepsis on 10/08/2016 and discharged to twin [facility] is presenting to the ED after patient sustained a fall and having dysarthria. According to the patient and her family members patient is having expressive aphasia. Fall was unwitnessed. Patient denies any weakness in his extremities. No blurry vision. No similar complaints in the past. Daughter at bedside. CT head is negative.  PAST MEDICAL HISTORY:   Past Medical History:  Diagnosis Date  . Anxiety   . CAD (coronary artery disease)   . Dementia   . Diabetes mellitus without complication ([place])   . GERD (gastroesophageal reflux disease)   . History of heart artery stent   . HLD (hyperlipidemia)   . HTN (hypertension)   . Parkinson disease ([place])     PAST SURGICAL HISTOIRY:   Past Surgical History:  Procedure Laterality Date  . APPENDECTOMY    . CORONARY STENT PLACEMENT    . EYE SURGERY    . TRANSURETHRAL RESECTION OF PROSTATE      SOCIAL HISTORY:   Social History  Substance Use Topics  . Smoking status: Former Smoker    Types: Cigarettes    Quit date: 51  . Smokeless tobacco: Never Used  . Alcohol use No    FAMILY HISTORY:   Family History  Problem Relation Age of Onset  . Heart disease Mother   . Heart disease Father   . Cancer Sister   . Cancer Brother     DRUG ALLERGIES:  No Known Allergies  REVIEW OF SYSTEMS:  Review of system unobtainable due to patient's  dementia  MEDICATIONS AT HOME:   Prior to Admission medications   Medication Sig Start Date End Date Taking? Authorizing Provider  albuterol (PROVENTIL HFA;VENTOLIN HFA) 108 (90 Base) MCG/ACT inhaler Inhale 2 puffs into the lungs every 6 (six) hours as needed for wheezing or shortness of breath. 10/07/16  Yes [name], MD  aspirin 325 MG tablet Take 1 tablet (325 mg total) by mouth daily. 10/08/16  Yes [name], MD  carbidopa-levodopa (SINEMET IR) 25-100 MG tablet Take 1 tablet by mouth 3 (three) times daily.   Yes Historical Provider, MD  cyclobenzaprine (FLEXERIL) 5 MG tablet Take 5 mg by mouth 3 (three) times daily as needed for muscle spasms.   Yes Historical Provider, MD  diltiazem (CARDIZEM) 60 MG tablet Take 1 tablet (60 mg total) by mouth every 6 (six) hours. 10/08/16  Yes [name], MD  gabapentin (NEURONTIN) 300 MG capsule Take 300 mg by mouth 2 (two) times daily.   Yes Historical Provider, MD  insulin detemir (LEVEMIR) 100 UNIT/ML injection Inject 0.12 mLs (12 Units total) into the skin at bedtime. 10/07/16  Yes [name], MD  insulin detemir (LEVEMIR) 100 UNIT/ML injection Inject 0.12 mLs (12 Units total) into the skin daily before breakfast. 10/08/16  Yes [name], MD  metaxalone (SKELAXIN) 400 MG tablet Take 1 tablet (400 mg total) by mouth 3 (three) times daily. 10/07/16  Yes [name],  MD  Multiple Vitamins-Minerals (MULTIVITAMIN WITH MINERALS) tablet Take 1 tablet by mouth daily.   Yes Historical Provider, MD  omeprazole (PRILOSEC) 20 MG capsule Take 20 mg by mouth daily.   Yes Historical Provider, MD  ondansetron (ZOFRAN) 4 MG tablet Take 4 mg by mouth daily as needed for nausea.    Yes Historical Provider, MD  QUEtiapine (SEROQUEL) 25 MG tablet Take 1 tablet (25 mg total) by mouth at bedtime. 10/07/16  Yes [name], MD  spironolactone (ALDACTONE) 50 MG tablet Take 50 mg by mouth daily.   Yes Historical Provider,  MD  tamsulosin (FLOMAX) 0.4 MG CAPS capsule Take 0.4 mg by mouth daily.   Yes Historical Provider, MD  amoxicillin-clavulanate (AUGMENTIN) 875-125 MG tablet Take 1 tablet by mouth 2 (two) times daily. Patient not taking: Reported on 10/20/2016 10/07/16   [name], MD  doxycycline (VIBRAMYCIN) 50 MG capsule Take 2 capsules (100 mg total) by mouth 2 (two) times daily. Patient not taking: Reported on 10/20/2016 10/08/16   [name], MD      VITAL SIGNS:  Blood pressure (!) 160/75, pulse 65, temperature 97.9 F (36.6 C), temperature source Oral, resp. rate 18, height 5\' 7"  (1.702 m), weight 70.3 kg (155 lb), SpO2 100 %.  PHYSICAL EXAMINATION:  GENERAL:  83 y.o.-year-old patient lying in the bed with no acute distress.  EYES: Pupils equal, round, reactive to light and accommodation. No scleral icterus. Extraocular muscles intact.  HEENT: Head atraumatic, normocephalic. Oropharynx and nasopharynx clear.  NECK:  Supple, no jugular venous distention. No thyroid enlargement, no tenderness.  LUNGS: Normal breath sounds bilaterally, no wheezing, rales,rhonchi or crepitation. No use of accessory muscles of respiration.  CARDIOVASCULAR: S1, S2 normal. No murmurs, rubs, or gallops.  ABDOMEN: Soft, nontender, nondistended. Bowel sounds present. No organomegaly or mass.  EXTREMITIES: No pedal edema, cyanosis, or clubbing.  NEUROLOGIC: Cranial nerves II through XII areGrossly  intact. Muscle strength 5/5 in all extremities. Sensation intact. Gait not checked.  PSYCHIATRIC: The patient is alert and oriented x 1-2 SKIN: No obvious rash, lesion, or ulcer.   LABORATORY PANEL:   CBC  Recent Labs Lab 10/20/16 1854  WBC 9.0  HGB 12.1*  HCT 36.1*  PLT 552*   ------------------------------------------------------------------------------------------------------------------  Chemistries   Recent Labs Lab 10/20/16 1854  NA 131*  K 4.4  CL 95*  CO2 28  GLUCOSE 144*  BUN 18   CREATININE 0.86  CALCIUM 9.3  AST 33  ALT 12*  ALKPHOS 75  BILITOT 0.6   ------------------------------------------------------------------------------------------------------------------  Cardiac Enzymes No results for input(s): TROPONINI in the last 168 hours. ------------------------------------------------------------------------------------------------------------------  RADIOLOGY:  Dg Chest 2 View  Result Date: 10/20/2016 CLINICAL DATA:  Unwitnessed fall. EXAM: CHEST  2 VIEW COMPARISON:  10/02/2016 FINDINGS: The lungs are clear wiithout focal pneumonia, edema, pneumothorax or pleural effusion. Interstitial markings are diffusely coarsened with chronic features. Skin fold noted over left upper lobe. The cardio pericardial silhouette is enlarged. The visualized bony structures of the thorax are intact. Telemetry leads overlie the chest. IMPRESSION: No active cardiopulmonary disease. Electronically Signed   By: [name] Stanley M.D.   On: 10/20/2016 20:08   Ct Head Wo Contrast  Result Date: 10/20/2016 CLINICAL DATA:  Unwitnessed fall headache EXAM: CT HEAD WITHOUT CONTRAST TECHNIQUE: Contiguous axial images were obtained from the base of the skull through the vertex without intravenous contrast. COMPARISON:  None. FINDINGS: Brain: No acute territorial infarction, hemorrhage or focal mass lesion is visualized. Mild to moderate atrophy. Moderate periventricular  and subcortical white matter small vessel ischemic changes. Ventricular prominence is felt secondary to atrophy. Probable tiny old lacunar infarcts in the basal ganglia Vascular: No hyperdense vessels.  Carotid artery calcifications. Skull: No fracture or suspicious bone lesion. Sinuses/Orbits: Fluid levels in the left maxillary and sphenoid sinuses. Mucosal thickening in the ethmoid sinuses. No acute orbital abnormality. Bilateral lens extraction. Other: None IMPRESSION: 1. No CT evidence for acute intracranial abnormality. 2. Moderate  small vessel ischemic changes of the white matter 3. Sinusitis Electronically Signed   By: Donavan Foil M.D.   On: 10/20/2016 20:03    EKG:   Orders placed or performed during the hospital encounter of 10/02/16  . ED EKG 12-Lead  . ED EKG 12-Lead  . EKG 12-Lead  . EKG 12-Lead  . EKG 12-Lead  . EKG 12-Lead    IMPRESSION AND PLAN:   Dennis Serrano  is a 83 y.o. male with a known history of Parkinson's dementia, hypertension, coronary artery disease recently admitted to the hospital for pneumonia with sepsis on 10/08/2016 and discharged to twin [facility] is presenting to the ED after patient sustained a fall and having dysarthria. According to the patient and her family members patient is having expressive aphasia.   #Fall with the dysarthria secondary to TIA versus CVA Admit to MedSurg unit; CT head is negative. MRI/MRA of the brain is ordered. Carotid Dopplers and 2-D echo cardiac exam is ordered.Bedside swallow evaluation. Nothing by mouth meanwhile. Check TSH, hemoglobin A1c and fasting lipid panel, neurology consult. Aspirin PR   #History of Parkinson's disease on Sinemet which is on hold  #History of coronary artery disease status post stent patient is currently symptomatically. Resume home medications after patient passes bedside swallow evaluation  #Diabetes mellitus check hemoglobin A1c and sliding scale insulin   Provide DVT prophylaxis   All the records are reviewed and case discussed with ED provider. Management plans discussed with the patient, family and they are in agreement.  CODE STATUS: fc , daughter Dennis Serrano healthcare power of attorney  TOTAL TIME TAKING CARE OF THIS PATIENT: 42  minutes.   Note: This dictation was prepared with Dragon dictation along with smaller phrase technology. Any transcriptional errors that result from this process are unintentional.  Dennis Serrano M.D on 10/20/2016 at 10:04 PM  Between 7am to 6pm - Pager -  [phone]  After 6pm go to [email or web address] - password [email or web address] [facility]  [facility]  Office  [phone]  CC: Primary care physician; Juline Patch, MD

## 2016-10-21 ENCOUNTER — Observation Stay: Payer: Medicare Other

## 2016-10-21 DIAGNOSIS — R4701 Aphasia: Secondary | ICD-10-CM | POA: Diagnosis not present

## 2016-10-21 LAB — MRSA PCR SCREENING: MRSA by PCR: NEGATIVE

## 2016-10-21 LAB — GLUCOSE, CAPILLARY
GLUCOSE-CAPILLARY: 136 mg/dL — AB (ref 65–99)
GLUCOSE-CAPILLARY: 155 mg/dL — AB (ref 65–99)
GLUCOSE-CAPILLARY: 107 mg/dL — AB (ref 65–99)

## 2016-10-21 LAB — TSH: TSH: 0.988 u[IU]/mL (ref 0.350–4.500)

## 2016-10-21 LAB — LIPID PANEL
Cholesterol: 235 mg/dL — ABNORMAL HIGH (ref 0–200)
HDL: 28 mg/dL — ABNORMAL LOW (ref >40)
LDL Cholesterol: 160 mg/dL — ABNORMAL HIGH (ref 0–99)
TRIGLYCERIDES: 237 mg/dL — AB (ref <150)
Total CHOL/HDL Ratio: 8.4 RATIO
VLDL: 47 mg/dL — ABNORMAL HIGH (ref 0–40)

## 2016-10-21 NOTE — Evaluation (Signed)
Physical Therapy Evaluation Patient Details Name: Dennis Serrano MRN: [id number] DOB: [birth year] Today's Date: 10/21/2016   History of Present Illness  83 y.o. male here two weeks ago with AMS, increased sleeping, decreased appetite, and cough.  Pt admitted with sepsis and community acquired PNA.  PMH includes Parkinson's disease, dementia, DM, and CAD.  He went to [facility] and now returns with possible TIA.  Clinical Impression  Pt impulsive and vacillating between agitated and pleasantly confused, he struggles to follow any instructions consistently and was unsafe and, again, highly impulsive t/o the effort.  He did not have any LOBs with ~75 ft of ambulation but ultimately showed poor awareness and is highly at risk for falls secondary to impulsivity.   Follow Up Recommendations SNF    Equipment Recommendations       Recommendations for Other Services       Precautions / Restrictions Precautions Precautions: Fall Restrictions Weight Bearing Restrictions: No      Mobility  Bed Mobility Overal bed mobility: Modified Independent Bed Mobility: Supine to Sit     Supine to sit: Min guard     General bed mobility comments: Pt impuslively getting to EOB and despite struggling to lift trunk did manage to pull himself up to sitting using rail  Transfers Overall transfer level: Needs assistance Equipment used: Rolling walker (2 wheeled) Transfers: Sit to/from Stand Sit to Stand: Min assist         General transfer comment: Pt impulsive and generally needing heavy cuing to remain safe with the transition.   Ambulation/Gait Ambulation/Gait assistance: Min assist;Mod assist Ambulation Distance (Feet): 75 Feet Assistive device: Rolling walker (2 wheeled)       General Gait Details: Pt with very impuslive and unsafe ambulation.  He did not have any LOBs but ran walker into multiple obstacles, was highly inconsistent and leaning forward heavily on walker.  Again impulsive and  unaware leading to considerable lack of safety.  Stairs            Wheelchair Mobility    Modified Rankin (Stroke Patients Only)       Balance Overall balance assessment: Needs assistance;History of Falls Sitting-balance support: Bilateral upper extremity supported Sitting balance-Leahy Scale: Fair     Standing balance support: Bilateral upper extremity supported Standing balance-Leahy Scale: Poor (secondary to impulsivity)                               Pertinent Vitals/Pain Pain Assessment: No/denies pain    Home Living Family/patient expects to be discharged to:: [place]: Spouse/significant other;Children             Home Equipment: Kasandra Knudsen - single point;Walker - 2 wheels Additional Comments: Pt unable to report any history, confused, impulsive    Prior Function Level of Independence: Needs assistance   Gait / Transfers Assistance Needed: Ambulates with SPC if he remembers it      Comments: Pt apparently has recently been falling more frequently     Hand Dominance        Extremity/Trunk Assessment   Upper Extremity Assessment Upper Extremity Assessment: Generalized weakness;Difficult to assess due to impaired cognition    Lower Extremity Assessment Lower Extremity Assessment: Generalized weakness;Difficult to assess due to impaired cognition       Communication   Communication: No difficulties  Cognition Arousal/Alertness: Awake/alert Behavior During Therapy: Impulsive;Restless Overall Cognitive Status: History of cognitive impairments - at baseline  General Comments      Exercises     Assessment/Plan    PT Assessment Patient needs continued PT services  PT Problem List Decreased strength;Decreased activity tolerance;Decreased balance;Decreased mobility;Decreased knowledge of use of DME;Decreased knowledge of precautions       PT  Treatment Interventions DME instruction;Gait training;Stair training;Functional mobility training;Therapeutic activities;Therapeutic exercise;Balance training;Patient/family education    PT Goals (Current goals can be found in the Care Plan section)  Acute Rehab PT Goals PT Goal Formulation: Patient unable to participate in goal setting (none stated, pt too confused and (pleasantly?) agitated) Potential to Achieve Goals: Fair    Frequency Min 2X/week   Barriers to discharge Decreased caregiver support      Co-evaluation               End of Session Equipment Utilized During Treatment: Gait belt Activity Tolerance: Patient tolerated treatment well;Treatment limited secondary to agitation Patient left: with call bell/phone within reach;with chair alarm set   PT Visit Diagnosis: Unsteadiness on feet (R26.81);History of falling (Z91.81);Repeated falls (R29.6);Muscle weakness (generalized) (M62.81);Difficulty in walking, not elsewhere classified (R26.2)    Time: 8185-6314 PT Time Calculation (min) (ACUTE ONLY): 18 min   Charges:   PT Evaluation $PT Eval Low Complexity: 1 Procedure     PT G Codes:   PT G-Codes **NOT FOR INPATIENT CLASS** Functional Assessment Tool Used: AM-PAC 6 Clicks Basic Mobility Functional Limitation: Mobility: Walking and moving around Mobility: Walking and Moving Around Current Status (H7026): At least 40 percent but less than 60 percent impaired, limited or restricted Mobility: Walking and Moving Around Goal Status [phone]): At least 1 percent but less than 20 percent impaired, limited or restricted    Kreg Shropshire, DPT 10/21/2016, 11:44 AM

## 2016-10-21 NOTE — Plan of Care (Signed)
Problem: Education: Goal: Knowledge of disease or condition will improve Outcome: Not Progressing Unable to educate pt to retain information at this time due to baseline medical history of dementia

## 2016-10-21 NOTE — Progress Notes (Signed)
MD order received in [facility] to discharge pt to SNF today; Care Management previously prepared paperwork for pt to go back to SNF/[facility]; report called to Airline pilot at [facility]; 911 called for non emergency transport to [facility]; discharge pending arrival of EMS for transport

## 2016-10-21 NOTE — Care Management (Signed)
Admitted to [facility] with the diagnosis of TIA  under observation status. Discharged from this facility 10/08/16 to [facility] for rehabilitation. Daughter is Lucretia Field [phone]). Primary care physician is Dr. Izell [place], last seen 03/25/16. Presented to after an unwitnessed fall at [facility]. CT head negative. Shelbie Ammons RN MSN CCM Care Management [phone]

## 2016-10-21 NOTE — Plan of Care (Signed)
Problem: Safety: Goal: Ability to remain free from injury will improve Outcome: Progressing Pt free from injury to date this shift;

## 2016-10-21 NOTE — Progress Notes (Signed)
Pnt admit with family at bedside.  Pnt baseline  has dementia and parkinson's. Family remained at bedside to complete admission profile. Pnt;s daughter is POA as well as nurse on 1A. Pnt was calm and talkative. Speech is clear but oriented x1 although alert. Completed NIH as much as possible. Neuro check no change overnight. Per daughter would like if bed alarm and all 4 bed rails were up. Per request pnt w/ bed rails up x4 and bed alarm on. Pnt also has safety rounder until 3am. Pnt has slept most of night no issues, bed low and call bell in reach. Pnt has ask appropriately to use urinal when having to void. Will continue to monitor

## 2016-10-21 NOTE — Evaluation (Signed)
Occupational Therapy Evaluation Patient Details Name: Dennis Serrano MRN: [id number] DOB: Apr 14, 1933 Today's Date: 10/21/2016    History of Present Illness Pt. is an 83 y.o. male who was admitted to [facility] with a possible TIA. Pt. was recently admitted with sepsis, and PNA. PMHx includes: Parkinson's Disease, Dememtia, DM, CAD, and HTN.     Clinical Impression   Pt. Is an 83 y.o. Male who was admitted to [facility] with a possible TIA. Pt. Presents with weakness,  AMS, impulsivity, and impaired functional mobility which hinders his ability to complete ADL, and IADL tasks. Grip strength: Right: 70#, Left : 62#. Pt. Could benefit from skilled OT services for ADL training, A/E training, UE there. Ex, work simplification strategies, and neuromuscular re-ed in order to work towards  returning to his PLOF. Pt. Could benefit from returning to SNF upon discharge with follow-up OT services.     Follow Up Recommendations  SNF    Equipment Recommendations       Recommendations for Other Services       Precautions / Restrictions Precautions Precautions: Fall Restrictions Weight Bearing Restrictions: No             ADL either performed or assessed with clinical judgement   ADL Overall ADL's : Needs assistance/impaired Eating/Feeding: Set up   Grooming: Min guard               Lower Body Dressing: Maximal assistance               Functional mobility during ADLs: Minimal assistance       Vision  No change from baseline       Perception     Praxis      Pertinent Vitals/Pain Pain Assessment: No/denies pain     Hand Dominance     Extremity/Trunk Assessment Upper Extremity Assessment Upper Extremity Assessment: Generalized weakness;Difficult to assess due to impaired cognition     Communication Communication Communication: No difficulties   Cognition Arousal/Alertness: Awake/alert Behavior During Therapy: Impulsive;Restless Overall Cognitive Status: History of  cognitive impairments - at baseline                                     General Comments       Exercises     Shoulder Instructions      Home Living Family/patient expects to be discharged to:: Skilled nursing facility [facility]) Living Arrangements: Spouse/significant other Available Help at Discharge: Family                         Home Equipment: Kasandra Knudsen - single point;Walker - 2 wheels   Additional Comments: Pt. was pleasantly confused.      Prior Functioning/Environment Level of Independence: Needs assistance  Gait / Transfers Assistance Needed: Ambulates with SPC if he remembers it  ADL's / Homemaking Assistance Needed: Assist with ADLs.   Comments: Pt apparently has recently been falling more frequently        OT Problem List: Decreased strength;Decreased activity tolerance;Decreased knowledge of precautions;Impaired UE functional use;Decreased coordination;Decreased safety awareness      OT Treatment/Interventions: Self-care/ADL training;Therapeutic exercise;Therapeutic activities;DME and/or AE instruction;Patient/family education;Neuromuscular education    OT Goals(Current goals can be found in the care plan section)    OT Frequency: Min 1X/week   Barriers to D/C:            Co-evaluation  End of Session    Activity Tolerance: Patient tolerated treatment well Patient left: in bed;with bed alarm set;with call bell/phone within reach  OT Visit Diagnosis: Muscle weakness (generalized) (M62.81)                Time: 6579-0383 OT Time Calculation (min): 15 min Charges:  OT General Charges $OT Visit: 1 Procedure OT Evaluation $OT Eval Moderate Complexity: 1 Procedure G-Codes: OT G-codes **NOT FOR INPATIENT CLASS** Functional Limitation: Self care Self Care Current Status (F3832): At least 40 percent but less than 60 percent impaired, limited or restricted Self Care Goal Status (N1916): At least 20 percent but  less than 40 percent impaired, limited or restricted   Harrel Carina, MS, OTR/L Harrel Carina, MS, OTR/L 10/21/2016, 1:41 PM

## 2016-10-21 NOTE — Discharge Instructions (Signed)
Resume diet and activity as before ° ° °

## 2016-10-21 NOTE — Plan of Care (Signed)
Problem: Activity: Goal: Risk for activity intolerance will decrease Outcome: Progressing PT worked with pt this shift; oob to chair; chair alarm in place

## 2016-10-21 NOTE — Progress Notes (Signed)
EMS present for pt discharge; discharge envelope given to EMS personnel pt discharged via stretcher to [facility]

## 2016-10-21 NOTE — NC FL2 (Signed)
[place] LEVEL OF CARE SCREENING TOOL     IDENTIFICATION  Patient Name: Dennis Serrano Birthdate: [birth year] Sex: male Admission Date (Current Location): 10/20/2016  [place] and [place] Number:  Engineering geologist and Address:  [employer], [address], [place], [place] [address]      Provider Number: [id number]  Attending Physician Name and Address:  Hillary Bow, MD  Relative Name and Phone Number:       Current Level of Care: Hospital Recommended Level of Care: [place] Prior Approval Number:    Date Approved/Denied:   PASRR Number: [id number]  Discharge Plan: SNF    Current Diagnoses: Patient Active Problem List   Diagnosis Date Noted  . TIA (transient ischemic attack) 10/20/2016  . Pressure injury of skin 10/07/2016  . Sepsis ([place]) 10/02/2016  . CAP (community acquired pneumonia) 10/02/2016  . HTN (hypertension) 10/02/2016  . HLD (hyperlipidemia) 10/02/2016  . CAD (coronary artery disease) 10/02/2016  . GERD (gastroesophageal reflux disease) 10/02/2016    Orientation RESPIRATION BLADDER Height & Weight     Self, Time  Normal Incontinent Weight: 153 lb 4.8 oz (69.5 kg) Height:  5\' 7"  (170.2 cm)  BEHAVIORAL SYMPTOMS/MOOD NEUROLOGICAL BOWEL NUTRITION STATUS      Continent Diet (Carb Modified, Thin Liquids)  AMBULATORY STATUS COMMUNICATION OF NEEDS Skin   Extensive Assist Verbally Normal                       Personal Care Assistance Level of Assistance  Bathing, Feeding, Dressing Bathing Assistance: Limited assistance Feeding assistance: Independent Dressing Assistance: Limited assistance     Functional Limitations Info  Sight, Hearing, Speech Sight Info: Adequate Hearing Info: Impaired Speech Info: Adequate    SPECIAL CARE FACTORS FREQUENCY  PT (By licensed PT)     PT Frequency: 5              Contractures Contractures Info: Not present    Additional Factors Info   Code Status, Allergies Code Status Info: Full Code Allergies Info: No known alleriges           Current Medications (10/21/2016):  This is the current hospital active medication list Current Facility-Administered Medications  Medication Dose Route Frequency Provider Last Rate Last Dose  . 0.9 %  sodium chloride infusion   Intravenous Continuous Nicholes Mango, MD 75 mL/hr at 10/21/16 0817    . acetaminophen (TYLENOL) tablet 650 mg  650 mg Oral Q4H PRN Nicholes Mango, MD       Or  . acetaminophen (TYLENOL) solution 650 mg  650 mg Per Tube Q4H PRN Nicholes Mango, MD       Or  . acetaminophen (TYLENOL) suppository 650 mg  650 mg Rectal Q4H PRN Aruna Gouru, MD      . albuterol (PROVENTIL) (2.5 MG/3ML) 0.083% nebulizer solution 2.5 mg  2.5 mg Inhalation Q6H PRN Nicholes Mango, MD      . aspirin suppository 300 mg  300 mg Rectal Daily Nicholes Mango, MD       Or  . aspirin tablet 325 mg  325 mg Oral Daily Nicholes Mango, MD   325 mg at 10/21/16 0810  . enoxaparin (LOVENOX) injection 40 mg  40 mg Subcutaneous Q24H Nicholes Mango, MD   40 mg at 10/21/16 0039  . insulin aspart (novoLOG) injection 0-5 Units  0-5 Units Subcutaneous QHS Aruna Gouru, MD      . insulin aspart (novoLOG) injection 0-9 Units  0-9  Units Subcutaneous TID WC Nicholes Mango, MD   2 Units at 10/21/16 1142     Discharge Medications: Please see discharge summary for a list of discharge medications.  Relevant Imaging Results:  Relevant Lab Results:   Additional Information SSN:  [id number]  Darden Dates, LCSW

## 2016-10-21 NOTE — Discharge Summary (Signed)
[facility] NAME: Dennis Serrano    MR#:  [id number]  DATE OF BIRTH:  1932/12/20  DATE OF ADMISSION:  10/20/2016 ADMITTING PHYSICIAN: Nicholes Mango, MD  DATE OF DISCHARGE: 10/21/2016  PRIMARY CARE PHYSICIAN: Juline Patch, MD   ADMISSION DIAGNOSIS:  Hyponatremia [E87.1] Expressive aphasia [R47.01] Unwitnessed fall [R29.6]  DISCHARGE DIAGNOSIS:  Active Problems:   TIA (transient ischemic attack)   SECONDARY DIAGNOSIS:   Past Medical History:  Diagnosis Date  . Anxiety   . CAD (coronary artery disease)   . Dementia   . Diabetes mellitus without complication ([place])   . GERD (gastroesophageal reflux disease)   . History of heart artery stent   . HLD (hyperlipidemia)   . HTN (hypertension)   . Parkinson disease ([place])   . Prostate cancer ([place])    over 5 years ago     ADMITTING HISTORY  CHIEF COMPLAINT:   Fall and altered mental status HISTORY OF PRESENT ILLNESS:  Dennis Serrano  is a 83 y.o. male with a known history of Parkinson's dementia, hypertension, coronary artery disease recently admitted to the hospital for pneumonia with sepsis on 10/08/2016 and discharged to [facility] is presenting to the ED after patient sustained a fall and having dysarthria. According to the patient and her family members patient is having expressive aphasia. Fall was unwitnessed. Patient denies any weakness in his extremities. No blurry vision. No similar complaints in the past. Daughter at bedside. CT head is negative.  HOSPITAL COURSE:   * Parkinson's and dementia This is most likely cause of the patient's fall and confusion. MRI of the brain was checked and did not show any acute stroke. Carotid Doppler showed complete occlusion of right ICA with reconstitution. To be on aspirin and statins. Not a candidate for stronger anticoagulation due to falls. Seen by neurology Dr. Doy Mince who did not recommend any  further echocardiogram if MRI did not show strokes.  His hypertension and diabetes remain unchanged. Continue same medications.  Patient will be discharged back to his rehabilitation center.  CONSULTS OBTAINED:  Treatment Team:  Alexis Goodell, MD Catarina Hartshorn, MD  DRUG ALLERGIES:  No Known Allergies  DISCHARGE MEDICATIONS:   Current Discharge Medication List    CONTINUE these medications which have NOT CHANGED   Details  albuterol (PROVENTIL HFA;VENTOLIN HFA) 108 (90 Base) MCG/ACT inhaler Inhale 2 puffs into the lungs every 6 (six) hours as needed for wheezing or shortness of breath. Qty: 1 Inhaler, Refills: 2    aspirin 325 MG tablet Take 1 tablet (325 mg total) by mouth daily. Qty: 30 tablet, Refills: 0    carbidopa-levodopa (SINEMET IR) 25-100 MG tablet Take 1 tablet by mouth 3 (three) times daily.    cyclobenzaprine (FLEXERIL) 5 MG tablet Take 5 mg by mouth 3 (three) times daily as needed for muscle spasms.    diltiazem (CARDIZEM) 60 MG tablet Take 1 tablet (60 mg total) by mouth every 6 (six) hours. Qty: 40 tablet, Refills: 0    gabapentin (NEURONTIN) 300 MG capsule Take 300 mg by mouth 2 (two) times daily.    !! insulin detemir (LEVEMIR) 100 UNIT/ML injection Inject 0.12 mLs (12 Units total) into the skin at bedtime. Qty: 10 mL, Refills: 11    !! insulin detemir (LEVEMIR) 100 UNIT/ML injection Inject 0.12 mLs (12 Units total) into the skin daily before breakfast. Qty: 10 mL, Refills: 11    metaxalone (SKELAXIN) 400 MG tablet Take  1 tablet (400 mg total) by mouth 3 (three) times daily. Qty: 10 tablet, Refills: 0    Multiple Vitamins-Minerals (MULTIVITAMIN WITH MINERALS) tablet Take 1 tablet by mouth daily.    omeprazole (PRILOSEC) 20 MG capsule Take 20 mg by mouth daily.    ondansetron (ZOFRAN) 4 MG tablet Take 4 mg by mouth daily as needed for nausea.     QUEtiapine (SEROQUEL) 25 MG tablet Take 1 tablet (25 mg total) by mouth at bedtime. Qty: 30 tablet,  Refills: 0    spironolactone (ALDACTONE) 50 MG tablet Take 50 mg by mouth daily.    tamsulosin (FLOMAX) 0.4 MG CAPS capsule Take 0.4 mg by mouth daily.     !! - Potential duplicate medications found. Please discuss with provider.    STOP taking these medications     amoxicillin-clavulanate (AUGMENTIN) 875-125 MG tablet      doxycycline (VIBRAMYCIN) 50 MG capsule         Today   VITAL SIGNS:  Blood pressure (!) 154/93, pulse 88, temperature 97.9 F (36.6 C), temperature source Oral, resp. rate 19, height 5\' 7"  (1.702 m), weight 69.5 kg (153 lb 4.8 oz), SpO2 100 %.  I/O:   Intake/Output Summary (Last 24 hours) at 10/21/16 1509 Last data filed at 10/21/16 1156  Gross per 24 hour  Intake          1843.14 ml  Output              100 ml  Net          1743.14 ml    PHYSICAL EXAMINATION:  Physical Exam  GENERAL:  83 y.o.-year-old patient lying in the bed with no acute distress.  LUNGS: Normal breath sounds bilaterally, no wheezing, rales,rhonchi or crepitation. No use of accessory muscles of respiration.  CARDIOVASCULAR: S1, S2 normal. No murmurs, rubs, or gallops.  NEUROLOGIC: Moves all 4 extremities. PSYCHIATRIC: The patient is alert and awake. confused  DATA REVIEW:   CBC  Recent Labs Lab 10/20/16 1854  WBC 9.0  HGB 12.1*  HCT 36.1*  PLT 552*    Chemistries   Recent Labs Lab 10/20/16 1854  NA 131*  K 4.4  CL 95*  CO2 28  GLUCOSE 144*  BUN 18  CREATININE 0.86  CALCIUM 9.3  AST 33  ALT 12*  ALKPHOS 75  BILITOT 0.6    Cardiac Enzymes No results for input(s): TROPONINI in the last 168 hours.  Microbiology Results  Results for orders placed or performed during the hospital encounter of 10/20/16  MRSA PCR Screening     Status: None   Collection Time: 10/21/16 12:38 AM  Result Value Ref Range Status   MRSA by PCR NEGATIVE NEGATIVE Final    Comment:        The GeneXpert MRSA Assay (FDA approved for NASAL specimens only), is one component of  a comprehensive MRSA colonization surveillance program. It is not intended to diagnose MRSA infection nor to guide or monitor treatment for MRSA infections.     RADIOLOGY:  Dg Chest 2 View  Result Date: 10/20/2016 CLINICAL DATA:  Unwitnessed fall. EXAM: CHEST  2 VIEW COMPARISON:  10/02/2016 FINDINGS: The lungs are clear wiithout focal pneumonia, edema, pneumothorax or pleural effusion. Interstitial markings are diffusely coarsened with chronic features. Skin fold noted over left upper lobe. The cardio pericardial silhouette is enlarged. The visualized bony structures of the thorax are intact. Telemetry leads overlie the chest. IMPRESSION: No active cardiopulmonary disease. Electronically Signed   By:  [name] Stanley M.D.   On: 10/20/2016 20:08   Ct Head Wo Contrast  Result Date: 10/20/2016 CLINICAL DATA:  Unwitnessed fall headache EXAM: CT HEAD WITHOUT CONTRAST TECHNIQUE: Contiguous axial images were obtained from the base of the skull through the vertex without intravenous contrast. COMPARISON:  None. FINDINGS: Brain: No acute territorial infarction, hemorrhage or focal mass lesion is visualized. Mild to moderate atrophy. Moderate periventricular and subcortical white matter small vessel ischemic changes. Ventricular prominence is felt secondary to atrophy. Probable tiny old lacunar infarcts in the basal ganglia Vascular: No hyperdense vessels.  Carotid artery calcifications. Skull: No fracture or suspicious bone lesion. Sinuses/Orbits: Fluid levels in the left maxillary and sphenoid sinuses. Mucosal thickening in the ethmoid sinuses. No acute orbital abnormality. Bilateral lens extraction. Other: None IMPRESSION: 1. No CT evidence for acute intracranial abnormality. 2. Moderate small vessel ischemic changes of the white matter 3. Sinusitis Electronically Signed   By: Donavan Foil M.D.   On: 10/20/2016 20:03   Mr Brain Wo Contrast  Result Date: 10/21/2016 CLINICAL DATA:  Expressive aphasia EXAM:  MRI HEAD WITHOUT CONTRAST MRA HEAD WITHOUT CONTRAST TECHNIQUE: Multiplanar, multiecho pulse sequences of the brain and surrounding structures were obtained without intravenous contrast. Angiographic images of the head were obtained using MRA technique without contrast. COMPARISON:  Head CT from yesterday FINDINGS: MRI HEAD FINDINGS Brain: Moderate generalized atrophy. Moderate chronic ischemic change in the cerebral white matter. Vascular: Loss of normal flow void involving the upper cervical and skullbase ICA on the right, with reconstituted appearance of the right MCA and ACA vessels. Skull and upper cervical spine: Negative for marrow lesion. C2-3 non segmentation. Sinuses/Orbits: Secretions layer in the left maxillary sinus. Bilateral cataract resection. MRA HEAD FINDINGS Right ICA occlusion in the neck with right MCA flow via anterior communicating artery, that is robust. This accounts for poor signal in the right MCA vessels and could obscure a stenotic lesion. Dominant left vertebral artery. Poor flow in the distal right V4 segment could be from tortuosity and smaller size. Robust flow in the basilar. Reasonably symmetric flow in the bilateral posterior cerebral arteries. No acute branch occlusion is noted. No generalized vessel irregularity when accounting for artifact. Negative for aneurysm. IMPRESSION: 1. Negative for acute infarct. 2. Right cervical ICA occlusion with intracranial reconstitution across the anterior communicating artery. 3. Moderate atrophy and chronic small vessel ischemia. 4. Secretions layer in the left maxillary sinus. Electronically Signed   By: Monte Fantasia M.D.   On: 10/21/2016 13:21   US Carotid Bilateral (at [facility] Only)  Result Date: 10/21/2016 CLINICAL DATA:  TIA EXAM: BILATERAL CAROTID DUPLEX ULTRASOUND TECHNIQUE: Pearline Cables scale imaging, color Doppler and duplex ultrasound were performed of bilateral carotid and vertebral arteries in the neck. COMPARISON:  None.  FINDINGS: Criteria: Quantification of carotid stenosis is based on velocity parameters that correlate the residual internal carotid diameter with NASCET-based stenosis levels, using the diameter of the distal internal carotid lumen as the denominator for stenosis measurement. The following velocity measurements were obtained: RIGHT ICA:  Occluded cm/sec CCA:  36 cm/sec SYSTOLIC ICA/CCA RATIO:  Not applicable DIASTOLIC ICA/CCA RATIO:  Not applicable ECA:  90 cm/sec LEFT ICA:  114 cm/sec CCA:  56 cm/sec SYSTOLIC ICA/CCA RATIO:  2.1 DIASTOLIC ICA/CCA RATIO:  2.7 ECA:  58 cm/sec RIGHT CAROTID ARTERY: There is moderate calcified plaque in the bulb. Above the bulb, the right internal carotid artery is occluded based on color flow and Doppler waveform imaging. RIGHT VERTEBRAL ARTERY:  Antegrade. LEFT CAROTID ARTERY: Moderate calcified plaque in the bulb. Low resistance internal carotid Doppler pattern. LEFT VERTEBRAL ARTERY:  Antegrade. IMPRESSION: Right internal carotid artery occlusion. Less than 50% stenosis in the left internal carotid artery. Electronically Signed   By: Marybelle Killings M.D.   On: 10/21/2016 14:05   Mr Jodene Nam Head/brain OM Cm  Result Date: 10/21/2016 CLINICAL DATA:  Expressive aphasia EXAM: MRI HEAD WITHOUT CONTRAST MRA HEAD WITHOUT CONTRAST TECHNIQUE: Multiplanar, multiecho pulse sequences of the brain and surrounding structures were obtained without intravenous contrast. Angiographic images of the head were obtained using MRA technique without contrast. COMPARISON:  Head CT from yesterday FINDINGS: MRI HEAD FINDINGS Brain: Moderate generalized atrophy. Moderate chronic ischemic change in the cerebral white matter. Vascular: Loss of normal flow void involving the upper cervical and skullbase ICA on the right, with reconstituted appearance of the right MCA and ACA vessels. Skull and upper cervical spine: Negative for marrow lesion. C2-3 non segmentation. Sinuses/Orbits: Secretions layer in the left  maxillary sinus. Bilateral cataract resection. MRA HEAD FINDINGS Right ICA occlusion in the neck with right MCA flow via anterior communicating artery, that is robust. This accounts for poor signal in the right MCA vessels and could obscure a stenotic lesion. Dominant left vertebral artery. Poor flow in the distal right V4 segment could be from tortuosity and smaller size. Robust flow in the basilar. Reasonably symmetric flow in the bilateral posterior cerebral arteries. No acute branch occlusion is noted. No generalized vessel irregularity when accounting for artifact. Negative for aneurysm. IMPRESSION: 1. Negative for acute infarct. 2. Right cervical ICA occlusion with intracranial reconstitution across the anterior communicating artery. 3. Moderate atrophy and chronic small vessel ischemia. 4. Secretions layer in the left maxillary sinus. Electronically Signed   By: Monte Fantasia M.D.   On: 10/21/2016 13:21    Follow up with PCP in 1 week.  Management plans discussed with the patient, family and they are in agreement.  CODE STATUS:     Code Status Orders        Start     Ordered   10/20/16 2337  Full code  Continuous     10/20/16 2336    Code Status History    Date Active Date Inactive Code Status Order ID Comments User Context   10/02/2016 11:24 PM 10/08/2016  4:31 PM Full Code [id number]  Lance Coon, MD Inpatient    Advance Directive Documentation     Most Recent Value  Type of Advance Directive  Healthcare Power of Attorney  Pre-existing out of facility DNR order (yellow form or pink MOST form)  -  "MOST" Form in Place?  -      TOTAL TIME TAKING CARE OF THIS PATIENT ON DAY OF DISCHARGE: more than 30 minutes.   Hillary Bow R M.D on 10/21/2016 at 3:09 PM  Between 7am to 6pm - Pager - [phone]  After 6pm go to [email or web address] - password [email or web address] [facility]  Office  [phone]  CC: Primary care physician; Juline Patch, MD  Note: This  dictation was prepared with Dragon dictation along with smaller phrase technology. Any transcriptional errors that result from this process are unintentional.

## 2016-10-21 NOTE — Consult Note (Addendum)
Referring Physician: Sudini    Chief Complaint: Slurred speech  HPI: Dennis Serrano is an 83 y.o. male who is a resident at [facility] who had a fall yesterday.  It appears that after the fall the patient had slurred speech and was sent to the ED for further evaluation.  The patient has a history of dementia and was unable to provide any history .  Family not clear on the details of what happened as well.  They do report that the patient falls frequently and that he has episodes of slurred speech related to being cold or anxious.  Memory poor and at times thinks his son is his brother.  They feel he is currently at baseline.    Date last known well: Date: 10/20/2016 Time last known well: Unable to determine tPA Given: No: Unable to determine LKW, resolution of symptoms  Past Medical History:  Diagnosis Date  . Anxiety   . CAD (coronary artery disease)   . Dementia   . Diabetes mellitus without complication ([place])   . GERD (gastroesophageal reflux disease)   . History of heart artery stent   . HLD (hyperlipidemia)   . HTN (hypertension)   . Parkinson disease ([place])   . Prostate cancer ([place])    over 5 years ago    Past Surgical History:  Procedure Laterality Date  . APPENDECTOMY    . CORONARY STENT PLACEMENT    . EYE SURGERY    . TRANSURETHRAL RESECTION OF PROSTATE      Family History  Problem Relation Age of Onset  . Heart disease Mother   . Heart disease Father   . Cancer Sister   . Cancer Brother    Social History:  reports that he quit smoking about 38 years ago. His smoking use included Cigarettes. He has never used smokeless tobacco. He reports that he does not drink alcohol or use drugs.  Allergies: No Known Allergies  Medications:  I have reviewed the patient's current medications. Prior to Admission:  Prescriptions Prior to Admission  Medication Sig Dispense Refill Last Dose  . albuterol (PROVENTIL HFA;VENTOLIN HFA) 108 (90 Base) MCG/ACT inhaler Inhale 2  puffs into the lungs every 6 (six) hours as needed for wheezing or shortness of breath. 1 Inhaler 2 PRN at PRN  . aspirin 325 MG tablet Take 1 tablet (325 mg total) by mouth daily. 30 tablet 0 10/20/2016 at 0800  . carbidopa-levodopa (SINEMET IR) 25-100 MG tablet Take 1 tablet by mouth 3 (three) times daily.   10/20/2016 at 1600  . cyclobenzaprine (FLEXERIL) 5 MG tablet Take 5 mg by mouth 3 (three) times daily as needed for muscle spasms.   10/20/2016 at 0800  . diltiazem (CARDIZEM) 60 MG tablet Take 1 tablet (60 mg total) by mouth every 6 (six) hours. 40 tablet 0 10/20/2016 at 1600  . gabapentin (NEURONTIN) 300 MG capsule Take 300 mg by mouth 2 (two) times daily.   10/20/2016 at 1600  . insulin detemir (LEVEMIR) 100 UNIT/ML injection Inject 0.12 mLs (12 Units total) into the skin at bedtime. 10 mL 11 10/19/2016 at 2100  . insulin detemir (LEVEMIR) 100 UNIT/ML injection Inject 0.12 mLs (12 Units total) into the skin daily before breakfast. 10 mL 11 10/20/2016 at 0730  . metaxalone (SKELAXIN) 400 MG tablet Take 1 tablet (400 mg total) by mouth 3 (three) times daily. 10 tablet 0 10/20/2016 at 0800  . Multiple Vitamins-Minerals (MULTIVITAMIN WITH MINERALS) tablet Take 1 tablet by mouth daily.  10/20/2016 at 0800  . omeprazole (PRILOSEC) 20 MG capsule Take 20 mg by mouth daily.   10/20/2016 at 0800  . ondansetron (ZOFRAN) 4 MG tablet Take 4 mg by mouth daily as needed for nausea.    PRN at PRN  . QUEtiapine (SEROQUEL) 25 MG tablet Take 1 tablet (25 mg total) by mouth at bedtime. 30 tablet 0 10/19/2016 at 2000  . spironolactone (ALDACTONE) 50 MG tablet Take 50 mg by mouth daily.   10/20/2016 at 0800  . tamsulosin (FLOMAX) 0.4 MG CAPS capsule Take 0.4 mg by mouth daily.   10/20/2016 at 0800  . amoxicillin-clavulanate (AUGMENTIN) 875-125 MG tablet Take 1 tablet by mouth 2 (two) times daily. (Patient not taking: Reported on 10/20/2016) 14 tablet 0 Completed Course at Unknown time  . doxycycline (VIBRAMYCIN) 50 MG capsule  Take 2 capsules (100 mg total) by mouth 2 (two) times daily. (Patient not taking: Reported on 10/20/2016) 10 capsule 0 Completed Course at Unknown time   Scheduled: . aspirin  300 mg Rectal Daily   Or  . aspirin  325 mg Oral Daily  . enoxaparin (LOVENOX) injection  40 mg Subcutaneous Q24H  . insulin aspart  0-5 Units Subcutaneous QHS  . insulin aspart  0-9 Units Subcutaneous TID WC    ROS: History obtained from the patient  General ROS: negative for - chills, fatigue, fever, night sweats, weight gain or weight loss Psychological ROS: as noted in HPI Ophthalmic ROS: negative for - blurry vision, double vision, eye pain or loss of vision ENT ROS: negative for - epistaxis, nasal discharge, oral lesions, sore throat, tinnitus or vertigo Allergy and Immunology ROS: negative for - hives or itchy/watery eyes Hematological and Lymphatic ROS: negative for - bleeding problems, bruising or swollen lymph nodes Endocrine ROS: negative for - galactorrhea, hair pattern changes, polydipsia/polyuria or temperature intolerance Respiratory ROS: negative for - cough, hemoptysis, shortness of breath or wheezing Cardiovascular ROS: negative for - chest pain, dyspnea on exertion, edema or irregular heartbeat Gastrointestinal ROS: negative for - abdominal pain, diarrhea, hematemesis, nausea/vomiting or stool incontinence Genito-Urinary ROS: negative for - dysuria, hematuria, incontinence or urinary frequency/urgency Musculoskeletal ROS: negative for - joint swelling or muscular weakness Neurological ROS: as noted in HPI Dermatological ROS: negative for rash and skin lesion changes  Physical Examination: Blood pressure (!) 165/77, pulse 88, temperature 97.5 F (36.4 C), temperature source Oral, resp. rate 19, height 5\' 7"  (1.702 m), weight 69.5 kg (153 lb 4.8 oz), SpO2 100 %.  HEENT-  Normocephalic, no lesions, without obvious abnormality.  Normal external eye and conjunctiva.  Normal TM's bilaterally.   Normal auditory canals and external ears. Normal external nose, mucus membranes and septum.  Normal pharynx. Cardiovascular- S1, S2 normal, pulses palpable throughout   Lungs- chest clear, no wheezing, rales, normal symmetric air entry Abdomen- soft, non-tender; bowel sounds normal; no masses,  no organomegaly Extremities- no edema Lymph-no adenopathy palpable Musculoskeletal-no joint tenderness, deformity or swelling Skin-multiple eccymotic and csarred over lesions on extremities from falls  Neurological Examination   Mental Status: Alert.  Not fully oriented.  Speech fluent without evidence of aphasia.  Able to follow 3 step commands but requires extensive reinforcement.   Cranial Nerves: II: Discs flat bilaterally; Visual fields grossly normal, pupils equal, round, reactive to light and accommodation III,IV, VI: ptosis not present, extra-ocular motions intact bilaterally V,VII: smile symmetric, facial light touch sensation normal bilaterally VIII: hearing normal bilaterally IX,X: gag reflex present XI: bilateral shoulder shrug XII: midline tongue extension  Motor: Right : Upper extremity   5/5    Left:     Upper extremity   5/5  Lower extremity   5/5     Lower extremity   5/5 Tone and bulk:normal tone throughout; no atrophy noted Sensory: Pinprick and light touch intact throughout, bilaterally Deep Tendon Reflexes: 1+ in the upper extremities, trace at the knees and absent at the ankles Plantars: Right: mute   Left: mute Cerebellar: Normal finger-to-nose and normal heel-to-shin testing bilaterally Gait: not tested due to safety concerns   Laboratory Studies:  Basic Metabolic Panel:  Recent Labs Lab 10/20/16 1854  NA 131*  K 4.4  CL 95*  CO2 28  GLUCOSE 144*  BUN 18  CREATININE 0.86  CALCIUM 9.3    Liver Function Tests:  Recent Labs Lab 10/20/16 1854  AST 33  ALT 12*  ALKPHOS 75  BILITOT 0.6  PROT 7.6  ALBUMIN 3.7   No results for input(s): LIPASE,  AMYLASE in the last 168 hours.  Recent Labs Lab 10/20/16 1907  AMMONIA 11    CBC:  Recent Labs Lab 10/20/16 1854  WBC 9.0  HGB 12.1*  HCT 36.1*  MCV 84.1  PLT 552*    Cardiac Enzymes: No results for input(s): CKTOTAL, CKMB, CKMBINDEX, TROPONINI in the last 168 hours.  BNP: Invalid input(s): POCBNP  CBG:  Recent Labs Lab 10/20/16 2238 10/21/16 0000 10/21/16 0738 10/21/16 1134  GLUCAP 122* 129* 136* 155*    Microbiology: Results for orders placed or performed during the hospital encounter of 10/20/16  MRSA PCR Screening     Status: None   Collection Time: 10/21/16 12:38 AM  Result Value Ref Range Status   MRSA by PCR NEGATIVE NEGATIVE Final    Comment:        The GeneXpert MRSA Assay (FDA approved for NASAL specimens only), is one component of a comprehensive MRSA colonization surveillance program. It is not intended to diagnose MRSA infection nor to guide or monitor treatment for MRSA infections.     Coagulation Studies: No results for input(s): LABPROT, INR in the last 72 hours.  Urinalysis:  Recent Labs Lab 10/20/16 1919  COLORURINE YELLOW*  LABSPEC 1.011  PHURINE 6.0  GLUCOSEU NEGATIVE  HGBUR NEGATIVE  BILIRUBINUR NEGATIVE  KETONESUR NEGATIVE  PROTEINUR 30*  NITRITE NEGATIVE  LEUKOCYTESUR NEGATIVE    Lipid Panel:    Component Value Date/Time   CHOL 235 (H) 10/21/2016 0439   TRIG 237 (H) 10/21/2016 0439   HDL 28 (L) 10/21/2016 0439   CHOLHDL 8.4 10/21/2016 0439   VLDL 47 (H) 10/21/2016 0439   LDLCALC 160 (H) 10/21/2016 0439    HgbA1C: No results found for: HGBA1C  Urine Drug Screen:  No results found for: LABOPIA, COCAINSCRNUR, LABBENZ, AMPHETMU, THCU, LABBARB  Alcohol Level: No results for input(s): ETH in the last 168 hours.   Imaging: Dg Chest 2 View  Result Date: 10/20/2016 CLINICAL DATA:  Unwitnessed fall. EXAM: CHEST  2 VIEW COMPARISON:  10/02/2016 FINDINGS: The lungs are clear wiithout focal pneumonia, edema,  pneumothorax or pleural effusion. Interstitial markings are diffusely coarsened with chronic features. Skin fold noted over left upper lobe. The cardio pericardial silhouette is enlarged. The visualized bony structures of the thorax are intact. Telemetry leads overlie the chest. IMPRESSION: No active cardiopulmonary disease. Electronically Signed   By: [name] Stanley M.D.   On: 10/20/2016 20:08   Ct Head Wo Contrast  Result Date: 10/20/2016 CLINICAL DATA:  Unwitnessed fall headache EXAM: CT HEAD  WITHOUT CONTRAST TECHNIQUE: Contiguous axial images were obtained from the base of the skull through the vertex without intravenous contrast. COMPARISON:  None. FINDINGS: Brain: No acute territorial infarction, hemorrhage or focal mass lesion is visualized. Mild to moderate atrophy. Moderate periventricular and subcortical white matter small vessel ischemic changes. Ventricular prominence is felt secondary to atrophy. Probable tiny old lacunar infarcts in the basal ganglia Vascular: No hyperdense vessels.  Carotid artery calcifications. Skull: No fracture or suspicious bone lesion. Sinuses/Orbits: Fluid levels in the left maxillary and sphenoid sinuses. Mucosal thickening in the ethmoid sinuses. No acute orbital abnormality. Bilateral lens extraction. Other: None IMPRESSION: 1. No CT evidence for acute intracranial abnormality. 2. Moderate small vessel ischemic changes of the white matter 3. Sinusitis Electronically Signed   By: Donavan Foil M.D.   On: 10/20/2016 20:03    Assessment: 83 y.o. male presenting after a fall with notable slurred speech.  Currently at baseline.  Head CT reviewed ans shows no acute changes.  Unclear if this is an event related to cerebral ischemia (TIA/CVA) or related to the fall.  LDL 160.  A1c pending.  Stroke Risk Factors - diabetes mellitus, hyperlipidemia and hypertension  Plan: 1. MRI of the brain without contrast.  If no acute infarct noted, no further stroke work up indicated.   If acute infarct noted would proceed with echocardiogram and carotid dopplers 2. PT consult, OT consult, Speech consult 3. Prophylactic therapy-Continue ASA 4. NPO until RN stroke swallow screen 5. Telemetry monitoring 6. Frequent neuro checks 7. Orthostatic vitals 8. Would restart Sinemet at preadmission dose 9. Statin therapy    Alexis Goodell, MD Neurology [phone] 10/21/2016, 11:48 AM

## 2016-10-21 NOTE — Clinical Social Work Note (Signed)
CSW consulted for pt as he was admitted from [facility]. CSW briefly met with pt, however he was confused. CSW met with pt's daughter, [name], to address consult. CSW introduced herself and explained role of Education officer, museum. CSW also explained the process of discharging to SNF. Pt's daughter is aware and agreeable to pt returning to facility.   Pt is able to return to [facility]. Facility is ready to admit pt as they have received discharge information. RN will call report. [facility] will provide transportation. CSW is signing off as no further needs identified.   Darden Dates, MSW, LCSW Clinical Social Worker  [phone]

## 2016-10-21 NOTE — Progress Notes (Addendum)
SLP Cancellation Note  Patient Details Name: Dennis Serrano MRN: [id number] DOB: May 22, 1933   Cancelled treatment:       Reason Eval/Treat Not Completed: SLP screened, no needs identified, will sign off (chart reviewed; consulted NSG, pt and family in room). Pt resides at [facility] w/ baseline of Dementia and Parkinson's dis. Wife and Son present in room denied any changes in swallowing or speech-language today and feel he is at his baseline; pt is verbally communicating w/ his family and w/ NSG his wants/needs. Both have noted some decline in his Cognitive language skills overall at [facility] and stated the "dementia is getting worse we think". Also noted an increase in falls per family.  Discussed general aspiration precautions including sitting upright and eating/drinking slowly to reduce risk for aspiration. Pt is currently on a regular diet and tolerating pills w/ NSG - recommend to give in applesauce if easier for swallowing. Suggested f/u w/ MD/Neurologist for more information re: the progressive nature of Dementia and Parkinson's Dis. NSG to reconsult if any change in status while admitted.   Watson,Katherine 10/21/2016, 10:55 AM

## 2016-10-21 NOTE — Progress Notes (Signed)
Chaplain received and order to visit with pt in room 102. Chaplain provided information for an Advanced Directive.    10/21/16 0930  Clinical Encounter Type  Visited With Patient  Visit Type Initial  Referral From Nurse  Consult/Referral To Chaplain  Spiritual Encounters  Spiritual Needs Other (Comment)

## 2016-10-22 LAB — HEMOGLOBIN A1C
Hgb A1c MFr Bld: 11.5 % — ABNORMAL HIGH (ref 4.8–5.6)
Mean Plasma Glucose: 283 mg/dL

## 2016-11-01 ENCOUNTER — Emergency Department
Admission: EM | Admit: 2016-11-01 | Discharge: 2016-11-01 | Disposition: A | Discharge status: Home / Self Care | Payer: Medicare Other | Attending: Emergency Medicine | Admitting: Emergency Medicine

## 2016-11-01 ENCOUNTER — Emergency Department: Payer: Medicare Other

## 2016-11-01 ENCOUNTER — Encounter: Payer: Self-pay | Admitting: Intensive Care

## 2016-11-01 DIAGNOSIS — Y939 Activity, unspecified: Secondary | ICD-10-CM | POA: Insufficient documentation

## 2016-11-01 DIAGNOSIS — W19XXXA Unspecified fall, initial encounter: Secondary | ICD-10-CM | POA: Insufficient documentation

## 2016-11-01 DIAGNOSIS — I2510 Atherosclerotic heart disease of native coronary artery without angina pectoris: Secondary | ICD-10-CM | POA: Diagnosis not present

## 2016-11-01 DIAGNOSIS — G20 Parkinson's disease: Secondary | ICD-10-CM | POA: Insufficient documentation

## 2016-11-01 DIAGNOSIS — Y999 Unspecified external cause status: Secondary | ICD-10-CM | POA: Insufficient documentation

## 2016-11-01 DIAGNOSIS — Z79899 Other long term (current) drug therapy: Secondary | ICD-10-CM | POA: Diagnosis not present

## 2016-11-01 DIAGNOSIS — Z7982 Long term (current) use of aspirin: Secondary | ICD-10-CM | POA: Insufficient documentation

## 2016-11-01 DIAGNOSIS — E119 Type 2 diabetes mellitus without complications: Secondary | ICD-10-CM | POA: Insufficient documentation

## 2016-11-01 DIAGNOSIS — Z794 Long term (current) use of insulin: Secondary | ICD-10-CM | POA: Insufficient documentation

## 2016-11-01 DIAGNOSIS — Z8546 Personal history of malignant neoplasm of prostate: Secondary | ICD-10-CM | POA: Insufficient documentation

## 2016-11-01 DIAGNOSIS — I10 Essential (primary) hypertension: Secondary | ICD-10-CM | POA: Insufficient documentation

## 2016-11-01 DIAGNOSIS — Z87891 Personal history of nicotine dependence: Secondary | ICD-10-CM | POA: Insufficient documentation

## 2016-11-01 DIAGNOSIS — M545 Low back pain: Secondary | ICD-10-CM | POA: Insufficient documentation

## 2016-11-01 DIAGNOSIS — Y929 Unspecified place or not applicable: Secondary | ICD-10-CM | POA: Diagnosis not present

## 2016-11-01 DIAGNOSIS — S3992XA Unspecified injury of lower back, initial encounter: Secondary | ICD-10-CM | POA: Diagnosis present

## 2016-11-01 LAB — URINALYSIS, COMPLETE (UACMP) WITH MICROSCOPIC
BACTERIA UA: NONE SEEN
Bilirubin Urine: NEGATIVE
GLUCOSE, UA: NEGATIVE mg/dL
Hgb urine dipstick: NEGATIVE
KETONES UR: NEGATIVE mg/dL
Leukocytes, UA: NEGATIVE
Nitrite: NEGATIVE
PROTEIN: 100 mg/dL — AB
Specific Gravity, Urine: 1.009 (ref 1.005–1.030)
Squamous Epithelial / LPF: NONE SEEN
WBC UA: NONE SEEN WBC/hpf (ref 0–5)
pH: 7 (ref 5.0–8.0)

## 2016-11-01 MED ORDER — ACETAMINOPHEN 325 MG PO TABS
ORAL_TABLET | ORAL | Status: AC
  Filled 2016-11-01: qty 1

## 2016-11-01 MED ORDER — ACETAMINOPHEN 325 MG PO TABS
650.0000 mg | ORAL_TABLET | Freq: Once | ORAL | Status: AC
  Administered 2016-11-01: 650 mg via ORAL
  Filled 2016-11-01: qty 2

## 2016-11-01 NOTE — ED Notes (Signed)
ED Provider at bedside. 

## 2016-11-01 NOTE — ED Notes (Signed)
PT  SENT  BACK  TO  [facility]  ALL  PAPERWORK  GIVEN TO EMS

## 2016-11-01 NOTE — Discharge Instructions (Signed)
Your xray of the lower back and pelvis was unremarkable.  Your urine test and EKG did not any acute issues today.

## 2016-11-01 NOTE — ED Notes (Signed)
Patient given graham crackers and coke

## 2016-11-01 NOTE — ED Triage Notes (Signed)
Patient arrived by EMS from [facility] for c/o mid-lower back pain. Fall 1 week ago per staff. HX dementia, DM, parkinsons, A-fib. Last given oxy 5mg  at 12pm. EMS vitals 200/91, HR 77, RR 18

## 2016-11-01 NOTE — ED Provider Notes (Signed)
[facility] Emergency Department Provider Note  ____________________________________________  Time seen: Approximately 3:50 PM  I have reviewed the triage vital signs and the nursing notes.   HISTORY  Chief Complaint Back Pain (lower-mid back)  Level 5 caveat:  Portions of the history and physical were unable to be obtained due to the patient's altered mental status from chronic dementia HPI Dennis Serrano is a 83 y.o. male sent to the ED due to a fall 1 week ago. There is also reports that he fell yesterday. He's had low back pain for the past week and sent to the ED for evaluation of this back pain. He has not seen his primary care doctor for this. Currently the patient denies any pain at all.     Past Medical History:  Diagnosis Date  . Anxiety   . CAD (coronary artery disease)   . Dementia   . Diabetes mellitus without complication ([place])   . GERD (gastroesophageal reflux disease)   . History of heart artery stent   . HLD (hyperlipidemia)   . HTN (hypertension)   . Parkinson disease ([place])   . Prostate cancer ([place])    over 5 years ago     Patient Active Problem List   Diagnosis Date Noted  . TIA (transient ischemic attack) 10/20/2016  . Pressure injury of skin 10/07/2016  . Sepsis ([place]) 10/02/2016  . CAP (community acquired pneumonia) 10/02/2016  . HTN (hypertension) 10/02/2016  . HLD (hyperlipidemia) 10/02/2016  . CAD (coronary artery disease) 10/02/2016  . GERD (gastroesophageal reflux disease) 10/02/2016     Past Surgical History:  Procedure Laterality Date  . APPENDECTOMY    . CORONARY STENT PLACEMENT    . EYE SURGERY    . TRANSURETHRAL RESECTION OF PROSTATE       Prior to Admission medications   Medication Sig Start Date End Date Taking? Authorizing Provider  acetaminophen (TYLENOL) 325 MG tablet Take 650 mg by mouth 3 (three) times daily as needed.   Yes [provider]  albuterol (PROVENTIL HFA;VENTOLIN HFA) 108 (90  Base) MCG/ACT inhaler Inhale 2 puffs into the lungs every 6 (six) hours as needed for wheezing or shortness of breath. 10/07/16  Yes [name], MD  aspirin EC 81 MG tablet Take 81 mg by mouth daily.   Yes [provider]  carbidopa-levodopa (SINEMET IR) 25-100 MG tablet Take 1 tablet by mouth 3 (three) times daily.   Yes [provider]  cyclobenzaprine (FLEXERIL) 5 MG tablet Take 5 mg by mouth 3 (three) times daily as needed for muscle spasms.   Yes [provider]  diltiazem (CARDIZEM) 60 MG tablet Take 1 tablet (60 mg total) by mouth every 6 (six) hours. 10/08/16  Yes [name], MD  gabapentin (NEURONTIN) 300 MG capsule Take 300 mg by mouth 2 (two) times daily.   Yes [provider]  insulin detemir (LEVEMIR) 100 UNIT/ML injection Inject 0.12 mLs (12 Units total) into the skin at bedtime. Patient taking differently: Inject 12 Units into the skin every 12 (twelve) hours.  10/07/16  Yes [name], MD  LORazepam (ATIVAN) 2 MG/ML concentrated solution Take 1 mg by mouth every 4 (four) hours as needed for anxiety.   Yes [provider]  metaxalone (SKELAXIN) 400 MG tablet Take 1 tablet (400 mg total) by mouth 3 (three) times daily. 10/07/16  Yes [name], MD  Multiple Vitamins-Minerals (MULTIVITAMIN WITH MINERALS) tablet Take 1 tablet by mouth daily.   Yes [provider]  omeprazole (PRILOSEC) 20 MG capsule Take 20 mg by mouth daily.   Yes [provider]  ondansetron (ZOFRAN) 4 MG tablet Take 4 mg by mouth daily as needed for nausea.    Yes [provider]  oxyCODONE (OXY IR/ROXICODONE) 5 MG immediate release tablet Take 5 mg by mouth every 4 (four) hours as needed for severe pain.   Yes [provider]  QUEtiapine (SEROQUEL) 25 MG tablet Take 1 tablet (25 mg total) by mouth at bedtime. 10/07/16  Yes [name], MD  spironolactone (ALDACTONE) 50 MG tablet Take 50 mg by  mouth daily.   Yes [provider]  tamsulosin (FLOMAX) 0.4 MG CAPS capsule Take 0.4 mg by mouth daily.   Yes [provider]  aspirin 325 MG tablet Take 1 tablet (325 mg total) by mouth daily. 10/08/16   [name], MD     Allergies Patient has no known allergies.   Family History  Problem Relation Age of Onset  . Heart disease Mother   . Heart disease Father   . Cancer Sister   . Cancer Brother     Social History Social History  Substance Use Topics  . Smoking status: Former Smoker    Types: Cigarettes    Quit date: 51  . Smokeless tobacco: Never Used  . Alcohol use No    Review of Systems Unable to obtain due to altered mental status and dementia ____________________________________________   PHYSICAL EXAM:  VITAL SIGNS: ED Triage Vitals  Enc Vitals Group     BP 11/01/16 1439 (!) 206/85     Pulse Rate 11/01/16 1439 79     Resp 11/01/16 1439 16     Temp 11/01/16 1439 98.1 F (36.7 C)     Temp Source 11/01/16 1439 Oral     SpO2 11/01/16 1439 98 %     Weight 11/01/16 1440 153 lb (69.4 kg)     Height 11/01/16 1440 5\' 7"  (1.702 m)     Head Circumference --      Peak Flow --      Pain Score 11/01/16 1438 10     Pain Loc --      Pain Edu? --      Excl. in [place]? --     Vital signs reviewed, nursing assessments reviewed.   Constitutional:   Alert and oriented1, apparent baseline. Well appearing and in no distress. Eyes:   No scleral icterus. No conjunctival pallor. PERRL. EOMI.  No nystagmus. ENT   Head:   Normocephalic and atraumatic.   Nose:   No congestion/rhinnorhea. No septal hematoma   Mouth/Throat:   MMM, no pharyngeal erythema. No peritonsillar mass.    Neck:   No stridor. No SubQ emphysema. No meningismus. Hematological/Lymphatic/Immunilogical:   No cervical lymphadenopathy. Cardiovascular:   RRR. Symmetric bilateral radial and DP pulses.  No murmurs.  Respiratory:   Normal respiratory effort without  tachypnea nor retractions. Breath sounds are clear and equal bilaterally. No wheezes/rales/rhonchi. Gastrointestinal:   Soft and nontender. Non distended. There is no CVA tenderness.  No rebound, rigidity, or guarding. Genitourinary:   deferred Musculoskeletal:   Normal range of motion in all extremities. No joint effusions.  No lower extremity tenderness.  No edema. No midline spinal tenderness in the CT or L-spine. No pain with change in position from supine to sitting upright. Neurologic:   Normal speech and language.  CN 2-10 normal. Motor grossly intact. No gross focal neurologic deficits are appreciated.  Skin:  Skin is warm, dry and intact. No rash noted.  No petechiae, purpura, or bullae.  ____________________________________________    LABS (pertinent positives/negatives) (all labs ordered are listed, but only abnormal results are displayed) Labs Reviewed  URINALYSIS, COMPLETE (UACMP) WITH MICROSCOPIC - Abnormal; Notable for the following:       Result Value   Color, Urine STRAW (*)    APPearance CLEAR (*)    Protein, ur 100 (*)    All other components within normal limits  URINE CULTURE   ____________________________________________   EKG  Interpreted by me Sinus rhythm rate of 81, left axis, normal intervals. Normal QRS ST segments and T waves  ____________________________________________    RADIOLOGY  Dg Lumbar Spine 2-3 Views  Result Date: 11/01/2016 CLINICAL DATA:  Mid lower back pain post fall 1 week ago, history dementia, Parkinson's, diabetes mellitus, hypertension EXAM: LUMBAR SPINE - 2-3 VIEW COMPARISON:  None FINDINGS: Diffuse osseous demineralization. 5 non-rib-bearing lumbar vertebra. Mild scattered disc space narrowing and endplate spur formation. Facet degenerative changes lower lumbar spine. Vertebral body heights maintained without fracture or subluxation. SI joints preserved. Atherosclerotic calcifications aorta and iliac arteries. Increased stool  in colon. IMPRESSION: Osseous demineralization with degenerative disc and facet disease changes lumbar spine. No definite acute bony abnormalities. Aortic atherosclerosis. Increased stool in colon. Electronically Signed   By: Lavonia Dana M.D.   On: 11/01/2016 16:09   Dg Pelvis 1-2 Views  Result Date: 11/01/2016 CLINICAL DATA:  Mid to lower back pain, fell one week ago EXAM: PELVIS - 1-2 VIEW COMPARISON:  None. FINDINGS: Osseous demineralization. Minimal narrowing of the hip joints. SI joints grossly preserved. No acute fracture, dislocation, or bone destruction. Seed implants (3) at prostate bed. IMPRESSION: Osseous demineralization with mild degenerative changes of the hip joints. No acute bony abnormalities. Electronically Signed   By: Lavonia Dana M.D.   On: 11/01/2016 16:26    ____________________________________________   PROCEDURES Procedures  ____________________________________________   INITIAL IMPRESSION / ASSESSMENT AND PLAN / ED COURSE  Pertinent labs & imaging results that were available during my care of the patient were reviewed by me and considered in my medical decision making (see chart for details).   Clinical Course as of Nov 01 1699  Tue Nov 01, 2016  1540 Pt p/w low back after fall.  Currently denies any pain. Exam reassuring with no focal tenderness even with movement and change of position. Will get xr, ekg, ua.   [PS]  1651 Color, Urine: Derek Jack [PS]    Clinical Course User Index [PS] Carrie Mew, MD     ----------------------------------------- 5:01 PM on 11/01/2016 -----------------------------------------  Workup negative. Vital signs remained stable. We'll discharge home, continue taking all medications including antihypertensives. No evidence of acute stroke or acute traumatic injury.  ____________________________________________   FINAL CLINICAL IMPRESSION(S) / ED DIAGNOSES  Final diagnoses:  Acute low back pain, unspecified back pain  laterality, with sciatica presence unspecified      New Prescriptions   No medications on file     Portions of this note were generated with dragon dictation software. Dictation errors may occur despite best attempts at proofreading.    Carrie Mew, MD 11/01/16 [phone]

## 2016-11-03 LAB — URINE CULTURE

## 2017-06-13 ENCOUNTER — Emergency Department
Admission: EM | Admit: 2017-06-13 | Discharge: 2017-06-13 | Disposition: A | Discharge status: Home / Self Care | Payer: Medicare Other | Attending: Emergency Medicine | Admitting: Emergency Medicine

## 2017-06-13 ENCOUNTER — Encounter: Payer: Self-pay | Admitting: Emergency Medicine

## 2017-06-13 ENCOUNTER — Emergency Department: Payer: Medicare Other

## 2017-06-13 DIAGNOSIS — Y939 Activity, unspecified: Secondary | ICD-10-CM | POA: Insufficient documentation

## 2017-06-13 DIAGNOSIS — Z955 Presence of coronary angioplasty implant and graft: Secondary | ICD-10-CM | POA: Diagnosis not present

## 2017-06-13 DIAGNOSIS — Z87891 Personal history of nicotine dependence: Secondary | ICD-10-CM | POA: Insufficient documentation

## 2017-06-13 DIAGNOSIS — Y999 Unspecified external cause status: Secondary | ICD-10-CM | POA: Insufficient documentation

## 2017-06-13 DIAGNOSIS — Z79899 Other long term (current) drug therapy: Secondary | ICD-10-CM | POA: Insufficient documentation

## 2017-06-13 DIAGNOSIS — M545 Low back pain, unspecified: Secondary | ICD-10-CM

## 2017-06-13 DIAGNOSIS — I2510 Atherosclerotic heart disease of native coronary artery without angina pectoris: Secondary | ICD-10-CM | POA: Diagnosis not present

## 2017-06-13 DIAGNOSIS — Z7982 Long term (current) use of aspirin: Secondary | ICD-10-CM | POA: Diagnosis not present

## 2017-06-13 DIAGNOSIS — F0390 Unspecified dementia without behavioral disturbance: Secondary | ICD-10-CM | POA: Diagnosis not present

## 2017-06-13 DIAGNOSIS — S22000A Wedge compression fracture of unspecified thoracic vertebra, initial encounter for closed fracture: Secondary | ICD-10-CM | POA: Diagnosis not present

## 2017-06-13 DIAGNOSIS — E119 Type 2 diabetes mellitus without complications: Secondary | ICD-10-CM | POA: Diagnosis not present

## 2017-06-13 DIAGNOSIS — Y92129 Unspecified place in nursing home as the place of occurrence of the external cause: Secondary | ICD-10-CM | POA: Diagnosis not present

## 2017-06-13 DIAGNOSIS — S299XXA Unspecified injury of thorax, initial encounter: Secondary | ICD-10-CM | POA: Diagnosis present

## 2017-06-13 DIAGNOSIS — Z794 Long term (current) use of insulin: Secondary | ICD-10-CM | POA: Insufficient documentation

## 2017-06-13 DIAGNOSIS — G20 Parkinson's disease: Secondary | ICD-10-CM | POA: Insufficient documentation

## 2017-06-13 DIAGNOSIS — I10 Essential (primary) hypertension: Secondary | ICD-10-CM | POA: Insufficient documentation

## 2017-06-13 DIAGNOSIS — X58XXXA Exposure to other specified factors, initial encounter: Secondary | ICD-10-CM | POA: Insufficient documentation

## 2017-06-13 LAB — URINALYSIS, COMPLETE (UACMP) WITH MICROSCOPIC
BACTERIA UA: NONE SEEN
Bilirubin Urine: NEGATIVE
Glucose, UA: NEGATIVE mg/dL
Hgb urine dipstick: NEGATIVE
KETONES UR: NEGATIVE mg/dL
LEUKOCYTES UA: NEGATIVE
Nitrite: NEGATIVE
PH: 7 (ref 5.0–8.0)
PROTEIN: 100 mg/dL — AB
SQUAMOUS EPITHELIAL / LPF: NONE SEEN
Specific Gravity, Urine: 1.006 (ref 1.005–1.030)

## 2017-06-13 LAB — CBC WITH DIFFERENTIAL/PLATELET
BASOS ABS: 0 10*3/uL (ref 0–0.1)
Basophils Relative: 0 %
Eosinophils Absolute: 0.2 10*3/uL (ref 0–0.7)
Eosinophils Relative: 2 %
HEMATOCRIT: 37.5 % — AB (ref 40.0–52.0)
HEMOGLOBIN: 12.6 g/dL — AB (ref 13.0–18.0)
LYMPHS ABS: 1.5 10*3/uL (ref 1.0–3.6)
LYMPHS PCT: 18 %
MCH: 28.2 pg (ref 26.0–34.0)
MCHC: 33.5 g/dL (ref 32.0–36.0)
MCV: 84 fL (ref 80.0–100.0)
Monocytes Absolute: 0.7 10*3/uL (ref 0.2–1.0)
Monocytes Relative: 8 %
NEUTROS ABS: 6 10*3/uL (ref 1.4–6.5)
NEUTROS PCT: 72 %
Platelets: 272 10*3/uL (ref 150–440)
RBC: 4.46 MIL/uL (ref 4.40–5.90)
RDW: 14.6 % — ABNORMAL HIGH (ref 11.5–14.5)
WBC: 8.4 10*3/uL (ref 3.8–10.6)

## 2017-06-13 LAB — BASIC METABOLIC PANEL
ANION GAP: 8 (ref 5–15)
BUN: 11 mg/dL (ref 6–20)
CHLORIDE: 97 mmol/L — AB (ref 101–111)
CO2: 30 mmol/L (ref 22–32)
Calcium: 9 mg/dL (ref 8.9–10.3)
Creatinine, Ser: 0.74 mg/dL (ref 0.61–1.24)
GFR calc Af Amer: >60 mL/min (ref >60)
GLUCOSE: 85 mg/dL (ref 65–99)
POTASSIUM: 3.4 mmol/L — AB (ref 3.5–5.1)
Sodium: 135 mmol/L (ref 135–145)

## 2017-06-13 MED ORDER — TRAMADOL HCL 50 MG PO TABS: 50.0000 mg | ORAL_TABLET | Freq: Four times a day (QID) | ORAL | 0 refills | Status: DC | PRN

## 2017-06-13 NOTE — ED Notes (Signed)
Left VM for patient's daughter and legal guardian Lucretia Field concerning patient coming to ED and seeking tx.

## 2017-06-13 NOTE — ED Provider Notes (Signed)
[facility] Emergency Department Provider Note   ____________________________________________   First MD Initiated Contact with Patient 06/13/17 [phone]     (approximate)  I have reviewed the triage vital signs and the nursing notes.   HISTORY  Chief Complaint Back Pain  Level 5 caveat: History limited by dementia  HPI Dennis Serrano is a 83 y.o. male brought to the ED from independent living facility with a chief complaint of right lower back pain.  EMS reports right lower back pain for the past 2 days, nontraumatic.  Patient states he is in a wheelchair all the time and does not recall falling or injuring his back, although he has a history of frequent falls.  Currently not complaining of back pain.  Denies recent fever, chills, chest pain, shortness of breath, abdominal pain, nausea, vomiting, dysuria.   Past Medical History:  Diagnosis Date  . Anxiety   . CAD (coronary artery disease)   . Dementia   . Diabetes mellitus without complication ([place])   . GERD (gastroesophageal reflux disease)   . History of heart artery stent   . HLD (hyperlipidemia)   . HTN (hypertension)   . Parkinson disease ([place])   . Prostate cancer ([place])    over 5 years ago    Patient Active Problem List   Diagnosis Date Noted  . TIA (transient ischemic attack) 10/20/2016  . Pressure injury of skin 10/07/2016  . Sepsis ([place]) 10/02/2016  . CAP (community acquired pneumonia) 10/02/2016  . HTN (hypertension) 10/02/2016  . HLD (hyperlipidemia) 10/02/2016  . CAD (coronary artery disease) 10/02/2016  . GERD (gastroesophageal reflux disease) 10/02/2016    Past Surgical History:  Procedure Laterality Date  . APPENDECTOMY    . CORONARY STENT PLACEMENT    . EYE SURGERY    . TRANSURETHRAL RESECTION OF PROSTATE      Prior to Admission medications   Medication Sig Start Date End Date Taking? Authorizing Provider  acetaminophen (TYLENOL) 325 MG tablet Take 650 mg by mouth 3  (three) times daily as needed.    [provider]  albuterol (PROVENTIL HFA;VENTOLIN HFA) 108 (90 Base) MCG/ACT inhaler Inhale 2 puffs into the lungs every 6 (six) hours as needed for wheezing or shortness of breath. 10/07/16   [name], MD  aspirin 325 MG tablet Take 1 tablet (325 mg total) by mouth daily. 10/08/16   [name], MD  aspirin EC 81 MG tablet Take 81 mg by mouth daily.    [provider]  carbidopa-levodopa (SINEMET IR) 25-100 MG tablet Take 1 tablet by mouth 3 (three) times daily.    [provider]  cyclobenzaprine (FLEXERIL) 5 MG tablet Take 5 mg by mouth 3 (three) times daily as needed for muscle spasms.    [provider]  diltiazem (CARDIZEM) 60 MG tablet Take 1 tablet (60 mg total) by mouth every 6 (six) hours. 10/08/16   [name], MD  gabapentin (NEURONTIN) 300 MG capsule Take 300 mg by mouth 2 (two) times daily.    [provider]  insulin detemir (LEVEMIR) 100 UNIT/ML injection Inject 0.12 mLs (12 Units total) into the skin at bedtime. Patient taking differently: Inject 12 Units into the skin every 12 (twelve) hours.  10/07/16   [name], MD  LORazepam (ATIVAN) 2 MG/ML concentrated solution Take 1 mg by mouth every 4 (four) hours as needed for anxiety.    [provider]  metaxalone (SKELAXIN) 400 MG tablet Take 1 tablet (400 mg total) by  mouth 3 (three) times daily. 10/07/16   [name], MD  Multiple Vitamins-Minerals (MULTIVITAMIN WITH MINERALS) tablet Take 1 tablet by mouth daily.    [provider]  omeprazole (PRILOSEC) 20 MG capsule Take 20 mg by mouth daily.    [provider]  ondansetron (ZOFRAN) 4 MG tablet Take 4 mg by mouth daily as needed for nausea.     [provider]  oxyCODONE (OXY IR/ROXICODONE) 5 MG immediate release tablet Take 5 mg by mouth every 4 (four) hours as needed for severe pain.    [provider]    QUEtiapine (SEROQUEL) 25 MG tablet Take 1 tablet (25 mg total) by mouth at bedtime. 10/07/16   [name], MD  spironolactone (ALDACTONE) 50 MG tablet Take 50 mg by mouth daily.    [provider]  tamsulosin (FLOMAX) 0.4 MG CAPS capsule Take 0.4 mg by mouth daily.    [provider]  traMADol (ULTRAM) 50 MG tablet Take 1 tablet (50 mg total) by mouth every 6 (six) hours as needed. 06/13/17   [name], MD    Allergies Patient has no known allergies.  Family History  Problem Relation Age of Onset  . Heart disease Mother   . Heart disease Father   . Cancer Sister   . Cancer Brother     Social History Social History   Tobacco Use  . Smoking status: Former Smoker    Types: Cigarettes    Last attempt to quit: 1980    Years since quitting: 38.9  . Smokeless tobacco: Never Used  Substance Use Topics  . Alcohol use: No  . Drug use: No    Review of Systems  Constitutional: No fever/chills. Eyes: No visual changes. ENT: No sore throat. Cardiovascular: Denies chest pain. Respiratory: Denies shortness of breath. Gastrointestinal: No abdominal pain.  No nausea, no vomiting.  No diarrhea.  No constipation. Genitourinary: Negative for dysuria. Musculoskeletal: Positive for back pain. Skin: Negative for rash. Neurological: Negative for headaches, focal weakness or numbness.  10 point review of systems limited by patient's dementia ____________________________________________   PHYSICAL EXAM:  VITAL SIGNS: ED Triage Vitals  Enc Vitals Group     BP      Pulse      Resp      Temp      Temp src      SpO2      Weight      Height      Head Circumference      Peak Flow      Pain Score      Pain Loc      Pain Edu?      Excl. in [place]?     Constitutional: Alert and oriented. Well appearing and in no acute distress. Eyes: Conjunctivae are normal. PERRL. EOMI. Head: Atraumatic. Nose: No congestion/rhinnorhea. Mouth/Throat: Mucous membranes  are moist.  Oropharynx non-erythematous. Neck: No stridor.  No cervical spine tenderness to palpation. Cardiovascular: Normal rate, regular rhythm. Grossly normal heart sounds.  Good peripheral circulation. Respiratory: Normal respiratory effort.  No retractions. Lungs CTAB. Gastrointestinal: Soft and nontender. No distention. No abdominal bruits. No CVA tenderness. Musculoskeletal: No spinal tenderness to palpation.  No step-offs or deformities noted.  No lower extremity tenderness nor edema.  No joint effusions. Neurologic: Alert and oriented to person and place.  Normal speech and language. No gross focal neurologic deficits are appreciated. MAEx4. Skin:  Skin is warm, dry and intact. No rash noted.  No  vesicles or petechiae. Psychiatric: Mood and affect are normal. Speech and behavior are normal.  ____________________________________________   LABS (all labs ordered are listed, but only abnormal results are displayed)  Labs Reviewed  CBC WITH DIFFERENTIAL/PLATELET - Abnormal; Notable for the following components:      Result Value   Hemoglobin 12.6 (*)    HCT 37.5 (*)    RDW 14.6 (*)    All other components within normal limits  BASIC METABOLIC PANEL - Abnormal; Notable for the following components:   Potassium 3.4 (*)    Chloride 97 (*)    All other components within normal limits  URINALYSIS, COMPLETE (UACMP) WITH MICROSCOPIC - Abnormal; Notable for the following components:   Color, Urine STRAW (*)    APPearance CLEAR (*)    Protein, ur 100 (*)    All other components within normal limits   ____________________________________________  EKG  ED ECG REPORT I, SUNG,JADE J, the attending physician, personally viewed and interpreted this ECG.   Date: 06/13/2017  EKG Time: 0417  Rate: 56  Rhythm: normal EKG, normal sinus rhythm  Axis: LAD  Intervals:none  ST&T Change: Nonspecific  ____________________________________________  RADIOLOGY  Dg Lumbar Spine  Complete  Result Date: 06/13/2017 CLINICAL DATA:  Lumbosacral back pain. EXAM: LUMBAR SPINE - COMPLETE 4+ VIEW COMPARISON:  Radiographs 11/01/2016 FINDINGS: T12 compression fracture with 50% loss of height anteriorly. This is not previously included in the lateral field of view. Lumbar vertebral body heights are maintained. Mild diffuse disc space narrowing and endplate spurring. Multilevel facet arthropathy. Dense aortic atherosclerosis. IMPRESSION: T12 compression fracture 50% loss of height anteriorly, age indeterminate, but possibly acute. This was not entirely previously included in the field of view. Electronically Signed   By: Jeb Levering M.D.   On: 06/13/2017 04:38    ____________________________________________   PROCEDURES  Procedure(s) performed: None  Procedures  Critical Care performed: No  ____________________________________________   INITIAL IMPRESSION / ASSESSMENT AND PLAN / ED COURSE  As part of my medical decision making, I reviewed the following data within the [facility] notes reviewed and incorporated, Labs reviewed, EKG interpreted, Old chart reviewed, Radiograph reviewed and Notes from prior ED visits.   83 year old male with dementia brought to the ED from independent living facility for nontraumatic right lower back pain.  Differential diagnosis includes but is not limited to musculoskeletal strain, sciatica, nephrolithiasis, pyelonephritis, UTI, shingles, renal dysfunction.  Patient currently resting in no acute distress, voicing no complaints of back pain.  Moving all extremities without deficit.  Will obtain basic lab work, x-rays, urinalysis and reassess.  Clinical Course as of Jun 13 445  Tue Jun 13, 2017  0443 Patient sleeping in no acute distress.  Updated patient of laboratory and imaging results.  Indeterminate age of thoracic compression fracture, although in the setting of patient's dementia, it could be acute to  subacute.  Patient is wheelchair-bound at baseline.  Will discharge back to nursing facility with a prescription for tramadol to take as needed for pain, and patient will follow-up with orthopedics this week.  Strict return precautions given.  Patient verbalizes understanding and agrees with plan of care.  [JS]    Clinical Course User Index [JS] [name], MD     ____________________________________________   FINAL CLINICAL IMPRESSION(S) / ED DIAGNOSES  Final diagnoses:  Acute right-sided low back pain without sciatica  Closed compression fracture of thoracic vertebra, initial encounter [facility])     ED Discharge Orders  Ordered    traMADol (ULTRAM) 50 MG tablet  Every 6 hours PRN     06/13/17 0445       Note:  This document was prepared using Dragon voice recognition software and may include unintentional dictation errors.    [name], MD 06/13/17 [phone]

## 2017-06-13 NOTE — Discharge Instructions (Signed)
1.  You may take Tylenol as needed for pain.  Take tramadol (#15) as needed for more severe pain. 2.  Return to the ER for worsening symptoms, persistent vomiting, difficulty breathing or other concerns.

## 2017-06-13 NOTE — ED Notes (Signed)
Patient transported to X-ray 

## 2017-06-13 NOTE — ED Notes (Signed)
Patient's daughter called me back and asked to be contacted if patient was going to be admitted.

## 2017-06-13 NOTE — ED Triage Notes (Signed)
Pt came from [facility] lower right back pain for the last 2 days, patient called EMS tonight.  Per EMS patient was waiting at the main desk for them to pick him up.  Their VS were WNL.  Pt AOx3, he thinks year is 2009.  He tried to void in a urinal but stated he didn't want to stand up long.  Pt answering "I don't know" to many of our questions.

## 2017-06-13 NOTE — ED Notes (Signed)
Pt in NAD at time of d/c, pt verbalizes d/c instructions and consent to ride back EMS to the [place]. Vs stable, MD Beather Arbour aware of BP, hx of htn.

## 2017-08-18 ENCOUNTER — Emergency Department: Payer: Medicare Other

## 2017-08-18 ENCOUNTER — Other Ambulatory Visit: Payer: Self-pay

## 2017-08-18 ENCOUNTER — Encounter: Payer: Self-pay | Admitting: *Deleted

## 2017-08-18 ENCOUNTER — Emergency Department
Admission: EM | Admit: 2017-08-18 | Discharge: 2017-08-18 | Disposition: A | Discharge status: Home / Self Care | Payer: Medicare Other | Attending: Emergency Medicine | Admitting: Emergency Medicine

## 2017-08-18 DIAGNOSIS — Z87891 Personal history of nicotine dependence: Secondary | ICD-10-CM | POA: Insufficient documentation

## 2017-08-18 DIAGNOSIS — E119 Type 2 diabetes mellitus without complications: Secondary | ICD-10-CM | POA: Diagnosis not present

## 2017-08-18 DIAGNOSIS — Z794 Long term (current) use of insulin: Secondary | ICD-10-CM | POA: Insufficient documentation

## 2017-08-18 DIAGNOSIS — I10 Essential (primary) hypertension: Secondary | ICD-10-CM | POA: Insufficient documentation

## 2017-08-18 DIAGNOSIS — F0280 Dementia in other diseases classified elsewhere without behavioral disturbance: Secondary | ICD-10-CM | POA: Insufficient documentation

## 2017-08-18 DIAGNOSIS — W19XXXA Unspecified fall, initial encounter: Secondary | ICD-10-CM | POA: Insufficient documentation

## 2017-08-18 DIAGNOSIS — Y999 Unspecified external cause status: Secondary | ICD-10-CM | POA: Insufficient documentation

## 2017-08-18 DIAGNOSIS — Z7982 Long term (current) use of aspirin: Secondary | ICD-10-CM | POA: Insufficient documentation

## 2017-08-18 DIAGNOSIS — Y92122 Bedroom in nursing home as the place of occurrence of the external cause: Secondary | ICD-10-CM | POA: Insufficient documentation

## 2017-08-18 DIAGNOSIS — Y9389 Activity, other specified: Secondary | ICD-10-CM | POA: Insufficient documentation

## 2017-08-18 DIAGNOSIS — S99911A Unspecified injury of right ankle, initial encounter: Secondary | ICD-10-CM | POA: Diagnosis present

## 2017-08-18 DIAGNOSIS — S9001XA Contusion of right ankle, initial encounter: Secondary | ICD-10-CM | POA: Diagnosis not present

## 2017-08-18 DIAGNOSIS — I259 Chronic ischemic heart disease, unspecified: Secondary | ICD-10-CM | POA: Insufficient documentation

## 2017-08-18 DIAGNOSIS — G20 Parkinson's disease: Secondary | ICD-10-CM | POA: Insufficient documentation

## 2017-08-18 DIAGNOSIS — Z79899 Other long term (current) drug therapy: Secondary | ICD-10-CM | POA: Diagnosis not present

## 2017-08-18 LAB — BASIC METABOLIC PANEL
Anion gap: 8 (ref 5–15)
BUN: 12 mg/dL (ref 6–20)
CO2: 30 mmol/L (ref 22–32)
CREATININE: 0.84 mg/dL (ref 0.61–1.24)
Calcium: 8.9 mg/dL (ref 8.9–10.3)
Chloride: 97 mmol/L — ABNORMAL LOW (ref 101–111)
GFR calc Af Amer: >60 mL/min (ref >60)
GLUCOSE: 82 mg/dL (ref 65–99)
Potassium: 3.6 mmol/L (ref 3.5–5.1)
Sodium: 135 mmol/L (ref 135–145)

## 2017-08-18 LAB — URINALYSIS, COMPLETE (UACMP) WITH MICROSCOPIC
Bacteria, UA: NONE SEEN
Bilirubin Urine: NEGATIVE
Glucose, UA: NEGATIVE mg/dL
Hgb urine dipstick: NEGATIVE
KETONES UR: NEGATIVE mg/dL
Leukocytes, UA: NEGATIVE
Nitrite: NEGATIVE
PH: 7 (ref 5.0–8.0)
Protein, ur: 100 mg/dL — AB
SPECIFIC GRAVITY, URINE: 1.006 (ref 1.005–1.030)

## 2017-08-18 LAB — CBC
HCT: 39.2 % — ABNORMAL LOW (ref 40.0–52.0)
Hemoglobin: 13.4 g/dL (ref 13.0–18.0)
MCH: 28.3 pg (ref 26.0–34.0)
MCHC: 34.1 g/dL (ref 32.0–36.0)
MCV: 82.8 fL (ref 80.0–100.0)
PLATELETS: 266 10*3/uL (ref 150–440)
RBC: 4.74 MIL/uL (ref 4.40–5.90)
RDW: 16.5 % — AB (ref 11.5–14.5)
WBC: 7.4 10*3/uL (ref 3.8–10.6)

## 2017-08-18 MED ORDER — CARBIDOPA-LEVODOPA 25-250 MG PO TABS
1.0000 | ORAL_TABLET | ORAL | Status: AC
  Administered 2017-08-18: 1 via ORAL
  Filled 2017-08-18: qty 1

## 2017-08-18 MED ORDER — LISINOPRIL 10 MG PO TABS
20.0000 mg | ORAL_TABLET | Freq: Once | ORAL | Status: AC
  Administered 2017-08-18: 20 mg via ORAL
  Filled 2017-08-18: qty 2

## 2017-08-18 MED ORDER — ACETAMINOPHEN 500 MG PO TABS
1000.0000 mg | ORAL_TABLET | ORAL | Status: AC
  Administered 2017-08-18: 1000 mg via ORAL
  Filled 2017-08-18: qty 2

## 2017-08-18 MED ORDER — LISINOPRIL 10 MG PO TABS
20.0000 mg | ORAL_TABLET | ORAL | Status: AC
  Administered 2017-08-18: 20 mg via ORAL
  Filled 2017-08-18: qty 2

## 2017-08-18 NOTE — ED Notes (Signed)
Pt c/o sore neck, L side and r ankle pain. No obvious injury to ankle. Skin tear on L forearm. Pt unsure when he fell or what he was doing prior to fall

## 2017-08-18 NOTE — ED Notes (Signed)
Daughter Dennis Serrano is POA. She will call her brother to pick pt up.

## 2017-08-18 NOTE — ED Provider Notes (Signed)
[facility] Emergency Department Provider Note   ____________________________________________   First MD Initiated Contact with Patient 08/18/17 [phone]     (approximate)  I have reviewed the triage vital signs and the nursing notes.   HISTORY  Chief Complaint Fall  EM caveat: Confusion/dementia  HPI Dennis Serrano is a 84 y.o. male who is reported to have been fallen next to his bed this morning at his care home.  Patient himself reports that he has a small cut on his left forearm, and reports his right ankle feels sore.  Otherwise no complaints.  Denies headache.  Does not recall the fall, denies neck pain.  No chest pain or trouble breathing.  Denies pain in his hips.  He is able to show me and moves all extremities well but points to the right side of his ankle reporting it feels little bit sore  Spoke with the patient's daughter, Dennis Serrano.  She is a Marine scientist and was aware that he had fallen, but did not have any other details.  Does report however that with his Parkinson's he seems to be developing some type of slowly worsening confusion/dementia.  He reports that for the last several months to a year he will not even recognize her, often believes that he has a sister.  Reports that he has not a reliable historian   There was no report by EMS or care staff of the patient have any loss of consciousness.  No noted fall from a high height.  Does not appear to be taking any anticoagulants baby aspirin.  Past Medical History:  Diagnosis Date  . Anxiety   . CAD (coronary artery disease)   . Dementia   . Diabetes mellitus without complication ([place])   . GERD (gastroesophageal reflux disease)   . History of heart artery stent   . HLD (hyperlipidemia)   . HTN (hypertension)   . Parkinson disease ([place])   . Prostate cancer ([place])    over 5 years ago    Patient Active Problem List   Diagnosis Date Noted  . TIA (transient ischemic attack) 10/20/2016  . Pressure  injury of skin 10/07/2016  . Sepsis ([place]) 10/02/2016  . CAP (community acquired pneumonia) 10/02/2016  . HTN (hypertension) 10/02/2016  . HLD (hyperlipidemia) 10/02/2016  . CAD (coronary artery disease) 10/02/2016  . GERD (gastroesophageal reflux disease) 10/02/2016    Past Surgical History:  Procedure Laterality Date  . APPENDECTOMY    . CORONARY STENT PLACEMENT    . EYE SURGERY    . TRANSURETHRAL RESECTION OF PROSTATE      Prior to Admission medications   Medication Sig Start Date End Date Taking? Authorizing Provider  aspirin 325 MG tablet Take 1 tablet (325 mg total) by mouth daily. Patient taking differently: Take 81 mg by mouth daily.  10/08/16  Yes [name], MD  carbidopa-levodopa (SINEMET IR) 25-100 MG tablet Take 1 tablet by mouth 3 (three) times daily.   Yes [provider]  celecoxib (CELEBREX) 200 MG capsule Take 200 mg by mouth 2 (two) times daily.   Yes [provider]  diazepam (VALIUM) 5 MG tablet Take 5 mg by mouth every 6 (six) hours as needed for anxiety.   Yes [provider]  diclofenac sodium (VOLTAREN) 1 % GEL Apply 2 g topically 4 (four) times daily.   Yes [provider]  diltiazem (CARDIZEM) 60 MG tablet Take 1 tablet (60 mg total) by mouth every 6 (six) hours. Patient taking  differently: Take 360 mg by mouth daily.  10/08/16  Yes [name], MD  docusate sodium (COLACE) 100 MG capsule Take 100 mg by mouth 2 (two) times daily.   Yes [provider]  hydrocerin (EUCERIN) CREA Apply 1 application topically daily.   Yes [provider]  insulin detemir (LEVEMIR) 100 UNIT/ML injection Inject 0.12 mLs (12 Units total) into the skin at bedtime. 10/07/16  Yes [name], MD  insulin NPH Human (HUMULIN N,NOVOLIN N) 100 UNIT/ML injection Inject 20-22 Units into the skin 2 (two) times daily before a meal.   Yes [provider]  ketoconazole (NIZORAL) 2 % shampoo Apply 1  application topically 2 (two) times a week.   Yes [provider]  lisinopril (PRINIVIL,ZESTRIL) 40 MG tablet Take 40 mg by mouth daily.   Yes [provider]  Magnesium Gluconate 500 (27 Mg) MG TABS Take 1,000 mg by mouth 3 (three) times daily.   Yes [provider]  polyethylene glycol powder (GAVILAX) powder Take 17 g by mouth daily.   Yes [provider]  acetaminophen (TYLENOL) 325 MG tablet Take 650 mg by mouth 3 (three) times daily as needed.    [provider]  albuterol (PROVENTIL HFA;VENTOLIN HFA) 108 (90 Base) MCG/ACT inhaler Inhale 2 puffs into the lungs every 6 (six) hours as needed for wheezing or shortness of breath. 10/07/16   [name], MD  cyclobenzaprine (FLEXERIL) 5 MG tablet Take 5 mg by mouth 3 (three) times daily as needed for muscle spasms.    [provider]  metaxalone (SKELAXIN) 400 MG tablet Take 1 tablet (400 mg total) by mouth 3 (three) times daily. Patient not taking: Reported on 08/18/2017 10/07/16   [name], MD  QUEtiapine (SEROQUEL) 25 MG tablet Take 1 tablet (25 mg total) by mouth at bedtime. Patient not taking: Reported on 08/18/2017 10/07/16   [name], MD  traMADol (ULTRAM) 50 MG tablet Take 1 tablet (50 mg total) by mouth every 6 (six) hours as needed. 06/13/17   [name], MD    Allergies Patient has no known allergies.  Family History  Problem Relation Age of Onset  . Heart disease Mother   . Heart disease Father   . Cancer Sister   . Cancer Brother     Social History Social History   Tobacco Use  . Smoking status: Former Smoker    Types: Cigarettes    Last attempt to quit: 1980    Years since quitting: 39.1  . Smokeless tobacco: Never Used  Substance Use Topics  . Alcohol use: No  . Drug use: No    Review of Systems EM caveat   ____________________________________________   PHYSICAL EXAM:  VITAL SIGNS: ED Triage Vitals  Enc Vitals Group      BP 08/18/17 0627 (!) 192/81     Pulse Rate 08/18/17 0624 78     Resp 08/18/17 0624 18     Temp 08/18/17 0622 98.2 F (36.8 C)     Temp Source 08/18/17 0622 Oral     SpO2 08/18/17 0624 97 %     Weight 08/18/17 0623 140 lb (63.5 kg)     Height 08/18/17 0623 5\' 6"  (1.676 m)     Head Circumference --      Peak Flow --      Pain Score 08/18/17 0622 3     Pain Loc --      Pain Edu? --      Excl. in  GC? --     Constitutional: Alert and oriented to self, but not to month or situation. Well appearing and in no acute distress.  He denies any concerns at present except would like a warm blankets. Eyes: Conjunctivae are normal. Head: Atraumatic.  No hematomas.  No bruising. Nose: No congestion/rhinnorhea. Mouth/Throat: Mucous membranes are moist. Neck: No stridor.  Cervical tenderness. Cardiovascular: Normal rate, regular rhythm. Grossly normal heart sounds.  Good peripheral circulation. Respiratory: Normal respiratory effort.  No retractions. Lungs CTAB. Gastrointestinal: Soft and nontender. No distention. Musculoskeletal:  RIGHT Right upper extremity demonstrates normal strength, good use of all muscles. No edema bruising or contusions of the right shoulder/upper arm, right elbow, right forearm / hand. Full range of motion of the right right upper extremity without pain. No evidence of trauma except for a small approximately 1 cm, superficial skin tear over the left forearm. Strong radial pulse. Intact median/ulnar/radial neuro-muscular exam.  LEFT Left upper extremity demonstrates normal strength, good use of all muscles. No edema bruising or contusions of the left shoulder/upper arm, left elbow, left forearm / hand. Full range of motion of the left  upper extremity without pain. No evidence of trauma. Strong radial pulse. Intact median/ulnar/radial neuro-muscular exam.  Lower Extremities  No edema. Normal DP/PT pulses bilateral with good cap refill.  Normal neuro-motor function lower  extremities bilateral.  RIGHT Right lower extremity demonstrates normal strength, good use of all muscles. No edema bruising or contusions of the right hip, right knee, right ankle except he does report a slight discomfort over the right lateral malleolus.. Full range of motion of the right lower extremity without pain except for report of a slight discomfort of the right ankle joint. No pain on axial loading. No evidence of trauma.  LEFT Left lower extremity demonstrates normal strength, good use of all muscles. No edema bruising or contusions of the hip,  knee, ankle. Full range of motion of the left lower extremity without pain. No pain on axial loading. No evidence of trauma.   Neurologic:  Normal speech and language. No gross focal neurologic deficits are appreciated.  Skin:  Skin is warm, dry and intact. No rash noted. Psychiatric: Mood and affect are normal. Speech and behavior are normal.  ____________________________________________   LABS (all labs ordered are listed, but only abnormal results are displayed)  Labs Reviewed  CBC - Abnormal; Notable for the following components:      Result Value   HCT 39.2 (*)    RDW 16.5 (*)    All other components within normal limits  BASIC METABOLIC PANEL - Abnormal; Notable for the following components:   Chloride 97 (*)    All other components within normal limits  URINALYSIS, COMPLETE (UACMP) WITH MICROSCOPIC - Abnormal; Notable for the following components:   Color, Urine STRAW (*)    APPearance CLEAR (*)    Protein, ur 100 (*)    Squamous Epithelial / LPF 0-5 (*)    All other components within normal limits   ____________________________________________  EKG  Reviewed interrupt me at 6:25 AM Ventricular rate 80 Normal sinus rhythm, QRS 100, QT 410. no evidence of ischemia or ectopy ____________________________________________  RADIOLOGY    Right ankle x-ray reviewed, negative for  acute ____________________________________________   PROCEDURES  Procedure(s) performed: None  Procedures  Critical Care performed: No  ____________________________________________   INITIAL IMPRESSION / ASSESSMENT AND PLAN / ED COURSE  Pertinent labs & imaging results that were available during my care of the patient  were reviewed by me and considered in my medical decision making (see chart for details).  Fall.  Unwitnessed.  Patient does have a history of cognitive decline.  Currently hemodynamically stable.  Blood pressure elevated is not but given his blood pressure medicine at home or his carbidopa yet.  Overall reassuring exam.  Normocephalic atraumatic.  Felt very low risk for acute intracranial injury.  There is no evidence of trauma to the head.  Appears to be at his baseline mental status.  Takes only aspirin.  Discussed with his daughter, she also reports she is a Marine scientist, discussed risks benefits of CT of the head given the patient's low risk for injury we will forego CT scan given a very low risk situation for intracranial hemorrhage.  No history of support any recent systemic infection.  Patient denies any cardiac or pulmonary symptoms.  Neurologically intact with gross movement of all extremities normal sensation.  We will obtain a CBC and metabolic panel, especially if the patient does take diuretic to evaluate electrolyte status.  In addition we will check EKG, and right ankle x-ray.  Clinical Course as of Aug 19 1627  Fri Aug 18, 2017  0838 Patient resting comfortably at this time.  Right ankle x-ray reviewed, negative for fractures.  Blood pressure does remain elevated, and review of his records from his nursing facility it appears that even at home his blood pressures have ranged from the 150s up into the 200s on his routine blood pressure checks as well.  Do not believe this is an acute issue, I will give his added lisinopril dose not I have seen his lab work and  anticipate discharge home with family.  Message without any associated patient demographics for his family to call us back  [MQ]    Clinical Course User Index [MQ] Delman Kitten, MD   Patient's family including niece who presents guardianship paperwork is at the bedside.  Updated on plan, comfortable with plan for discharge back to his facility.  No evidence of acute injury denoted today.  ____________________________________________   FINAL CLINICAL IMPRESSION(S) / ED DIAGNOSES  Final diagnoses:  Contusion of right ankle, initial encounter  Fall, initial encounter      NEW MEDICATIONS STARTED DURING THIS VISIT:  Discharge Medication List as of 08/18/2017 11:39 AM       Note:  This document was prepared using Dragon voice recognition software and may include unintentional dictation errors.     Delman Kitten, MD 08/18/17 1630

## 2017-08-18 NOTE — ED Notes (Signed)
Left message for Dennis Serrano, daughter, to call.

## 2017-08-18 NOTE — ED Notes (Signed)
Pt discharged home with granddaughter [name] [facility]) after she verbalized understanding of discharge instructions; nad noted.

## 2017-08-18 NOTE — ED Notes (Signed)
Pt presents post unwitnessed fall. Pt oriented to self, disoriented to time, place. Pt complains that his right ankle and back. Pt alert, nad noted.

## 2017-08-18 NOTE — Discharge Instructions (Signed)

## 2017-08-18 NOTE — ED Triage Notes (Signed)
Per EMS report from [facility] Pt fell between chair and bed at an unknown time. Unsure of what happened. Pt c/o left side pain and right ankle

## 2017-08-19 ENCOUNTER — Emergency Department
Admission: EM | Admit: 2017-08-19 | Discharge: 2017-08-19 | Disposition: A | Discharge status: Home / Self Care | Payer: Medicare Other | Attending: Emergency Medicine | Admitting: Emergency Medicine

## 2017-08-19 ENCOUNTER — Encounter: Payer: Self-pay | Admitting: *Deleted

## 2017-08-19 ENCOUNTER — Other Ambulatory Visit: Payer: Self-pay

## 2017-08-19 ENCOUNTER — Emergency Department: Payer: Medicare Other

## 2017-08-19 DIAGNOSIS — E876 Hypokalemia: Secondary | ICD-10-CM | POA: Diagnosis not present

## 2017-08-19 DIAGNOSIS — Z7982 Long term (current) use of aspirin: Secondary | ICD-10-CM | POA: Insufficient documentation

## 2017-08-19 DIAGNOSIS — Z87891 Personal history of nicotine dependence: Secondary | ICD-10-CM | POA: Insufficient documentation

## 2017-08-19 DIAGNOSIS — I2510 Atherosclerotic heart disease of native coronary artery without angina pectoris: Secondary | ICD-10-CM | POA: Insufficient documentation

## 2017-08-19 DIAGNOSIS — I10 Essential (primary) hypertension: Secondary | ICD-10-CM | POA: Insufficient documentation

## 2017-08-19 DIAGNOSIS — F0390 Unspecified dementia without behavioral disturbance: Secondary | ICD-10-CM | POA: Diagnosis not present

## 2017-08-19 DIAGNOSIS — G20 Parkinson's disease: Secondary | ICD-10-CM | POA: Insufficient documentation

## 2017-08-19 DIAGNOSIS — R112 Nausea with vomiting, unspecified: Secondary | ICD-10-CM | POA: Diagnosis not present

## 2017-08-19 DIAGNOSIS — Z794 Long term (current) use of insulin: Secondary | ICD-10-CM | POA: Insufficient documentation

## 2017-08-19 DIAGNOSIS — R1110 Vomiting, unspecified: Secondary | ICD-10-CM | POA: Diagnosis present

## 2017-08-19 DIAGNOSIS — E119 Type 2 diabetes mellitus without complications: Secondary | ICD-10-CM | POA: Diagnosis not present

## 2017-08-19 LAB — COMPREHENSIVE METABOLIC PANEL
ALBUMIN: 3.4 g/dL — AB (ref 3.5–5.0)
ALK PHOS: 83 U/L (ref 38–126)
ALT: 7 U/L — ABNORMAL LOW (ref 17–63)
ANION GAP: 11 (ref 5–15)
AST: 40 U/L (ref 15–41)
BILIRUBIN TOTAL: 0.5 mg/dL (ref 0.3–1.2)
BUN: 16 mg/dL (ref 6–20)
CALCIUM: 9.1 mg/dL (ref 8.9–10.3)
CO2: 27 mmol/L (ref 22–32)
Chloride: 96 mmol/L — ABNORMAL LOW (ref 101–111)
Creatinine, Ser: 0.9 mg/dL (ref 0.61–1.24)
GFR calc Af Amer: >60 mL/min (ref >60)
GFR calc non Af Amer: >60 mL/min (ref >60)
GLUCOSE: 155 mg/dL — AB (ref 65–99)
Potassium: 2.8 mmol/L — ABNORMAL LOW (ref 3.5–5.1)
Sodium: 134 mmol/L — ABNORMAL LOW (ref 135–145)
TOTAL PROTEIN: 6.4 g/dL — AB (ref 6.5–8.1)

## 2017-08-19 LAB — CBC
HEMATOCRIT: 40.3 % (ref 40.0–52.0)
HEMOGLOBIN: 13.8 g/dL (ref 13.0–18.0)
MCH: 28.4 pg (ref 26.0–34.0)
MCHC: 34.1 g/dL (ref 32.0–36.0)
MCV: 83.1 fL (ref 80.0–100.0)
Platelets: 292 10*3/uL (ref 150–440)
RBC: 4.85 MIL/uL (ref 4.40–5.90)
RDW: 16.5 % — ABNORMAL HIGH (ref 11.5–14.5)
WBC: 8.4 10*3/uL (ref 3.8–10.6)

## 2017-08-19 LAB — LIPASE, BLOOD: Lipase: 24 U/L (ref 11–51)

## 2017-08-19 LAB — TROPONIN I: Troponin I: <0.03 ng/mL (ref <0.03)

## 2017-08-19 MED ORDER — POTASSIUM CHLORIDE CRYS ER 20 MEQ PO TBCR
40.0000 meq | EXTENDED_RELEASE_TABLET | Freq: Once | ORAL | Status: AC
  Administered 2017-08-19: 40 meq via ORAL
  Filled 2017-08-19: qty 2

## 2017-08-19 MED ORDER — ONDANSETRON 4 MG PO TBDP: 4.0000 mg | ORAL_TABLET | Freq: Three times a day (TID) | ORAL | 0 refills | Status: DC | PRN

## 2017-08-19 MED ORDER — SODIUM CHLORIDE 0.9 % IV BOLUS (SEPSIS)
250.0000 mL | Freq: Once | INTRAVENOUS | Status: AC
  Administered 2017-08-19: 250 mL via INTRAVENOUS

## 2017-08-19 MED ORDER — ONDANSETRON HCL 4 MG/2ML IJ SOLN
4.0000 mg | Freq: Once | INTRAMUSCULAR | Status: AC
  Administered 2017-08-19: 4 mg via INTRAVENOUS
  Filled 2017-08-19: qty 2

## 2017-08-19 MED ORDER — POTASSIUM CHLORIDE 10 MEQ/100ML IV SOLN
10.0000 meq | Freq: Once | INTRAVENOUS | Status: AC
  Administered 2017-08-19: 10 meq via INTRAVENOUS
  Filled 2017-08-19: qty 100

## 2017-08-19 MED ORDER — SODIUM CHLORIDE 0.9 % IV BOLUS (SEPSIS)
1000.0000 mL | Freq: Once | INTRAVENOUS | Status: AC
  Administered 2017-08-19: 1000 mL via INTRAVENOUS

## 2017-08-19 MED ORDER — POTASSIUM CHLORIDE CRYS ER 10 MEQ PO TBCR: 10.0000 meq | EXTENDED_RELEASE_TABLET | Freq: Two times a day (BID) | ORAL | 0 refills | Status: DC

## 2017-08-19 NOTE — ED Provider Notes (Signed)
[facility] Emergency Department Provider Note  Time seen: 12:30 PM  I have reviewed the triage vital signs and the nursing notes.   HISTORY  Chief Complaint Emesis    HPI Dennis Serrano is a 84 y.o. male with a past medical history of anxiety, CAD, dementia, diabetes, gastric reflux, hypertension, hyperlipidemia, presents to the emergency department via EMS for nausea vomiting.  According to EMS patient is from the [facility], they report nausea vomiting today and the patient complaining of upper abdominal discomfort.  Here the patient denies nausea, but does remember vomiting earlier.  When asked if he is hurting he states no but states he has a "crawling" sensation in his upper abdomen.  Patient is not able to answer review of systems questioning due to baseline dementia.   Past Medical History:  Diagnosis Date  . Anxiety   . CAD (coronary artery disease)   . Dementia   . Diabetes mellitus without complication ([place])   . GERD (gastroesophageal reflux disease)   . History of heart artery stent   . HLD (hyperlipidemia)   . HTN (hypertension)   . Parkinson disease ([place])   . Prostate cancer ([place])    over 5 years ago    Patient Active Problem List   Diagnosis Date Noted  . TIA (transient ischemic attack) 10/20/2016  . Pressure injury of skin 10/07/2016  . Sepsis ([place]) 10/02/2016  . CAP (community acquired pneumonia) 10/02/2016  . HTN (hypertension) 10/02/2016  . HLD (hyperlipidemia) 10/02/2016  . CAD (coronary artery disease) 10/02/2016  . GERD (gastroesophageal reflux disease) 10/02/2016    Past Surgical History:  Procedure Laterality Date  . APPENDECTOMY    . CORONARY STENT PLACEMENT    . EYE SURGERY    . TRANSURETHRAL RESECTION OF PROSTATE      Prior to Admission medications   Medication Sig Start Date End Date Taking? Authorizing Provider  acetaminophen (TYLENOL) 325 MG tablet Take 650 mg by mouth 3 (three) times daily as needed.     [provider]  albuterol (PROVENTIL HFA;VENTOLIN HFA) 108 (90 Base) MCG/ACT inhaler Inhale 2 puffs into the lungs every 6 (six) hours as needed for wheezing or shortness of breath. 10/07/16   [name], MD  aspirin 325 MG tablet Take 1 tablet (325 mg total) by mouth daily. Patient taking differently: Take 81 mg by mouth daily.  10/08/16   [name], MD  carbidopa-levodopa (SINEMET IR) 25-100 MG tablet Take 1 tablet by mouth 3 (three) times daily.    [provider]  celecoxib (CELEBREX) 200 MG capsule Take 200 mg by mouth 2 (two) times daily.    [provider]  cyclobenzaprine (FLEXERIL) 5 MG tablet Take 5 mg by mouth 3 (three) times daily as needed for muscle spasms.    [provider]  diazepam (VALIUM) 5 MG tablet Take 5 mg by mouth every 6 (six) hours as needed for anxiety.    [provider]  diclofenac sodium (VOLTAREN) 1 % GEL Apply 2 g topically 4 (four) times daily.    [provider]  diltiazem (CARDIZEM) 60 MG tablet Take 1 tablet (60 mg total) by mouth every 6 (six) hours. Patient taking differently: Take 360 mg by mouth daily.  10/08/16   [name], MD  docusate sodium (COLACE) 100 MG capsule Take 100 mg by mouth 2 (two) times daily.    [provider]  hydrocerin (EUCERIN) CREA Apply 1 application topically daily.    [provider]  insulin detemir (LEVEMIR) 100 UNIT/ML injection Inject 0.12 mLs (12 Units total) into the skin at bedtime. 10/07/16   [name], MD  insulin NPH Human (HUMULIN N,NOVOLIN N) 100 UNIT/ML injection Inject 20-22 Units into the skin 2 (two) times daily before a meal.    [provider]  ketoconazole (NIZORAL) 2 % shampoo Apply 1 application topically 2 (two) times a week.    [provider]  lisinopril (PRINIVIL,ZESTRIL) 40 MG tablet Take 40 mg by mouth daily.    [provider]  Magnesium Gluconate 500 (27 Mg) MG TABS  Take 1,000 mg by mouth 3 (three) times daily.    [provider]  metaxalone (SKELAXIN) 400 MG tablet Take 1 tablet (400 mg total) by mouth 3 (three) times daily. Patient not taking: Reported on 08/18/2017 10/07/16   [name], MD  polyethylene glycol powder (GAVILAX) powder Take 17 g by mouth daily.    [provider]  QUEtiapine (SEROQUEL) 25 MG tablet Take 1 tablet (25 mg total) by mouth at bedtime. Patient not taking: Reported on 08/18/2017 10/07/16   [name], MD  traMADol (ULTRAM) 50 MG tablet Take 1 tablet (50 mg total) by mouth every 6 (six) hours as needed. 06/13/17   [name], MD    No Known Allergies  Family History  Problem Relation Age of Onset  . Heart disease Mother   . Heart disease Father   . Cancer Sister   . Cancer Brother     Social History Social History   Tobacco Use  . Smoking status: Former Smoker    Types: Cigarettes    Last attempt to quit: 1980    Years since quitting: 39.1  . Smokeless tobacco: Never Used  Substance Use Topics  . Alcohol use: No  . Drug use: No    Review of Systems Unable to obtain an adequate/accurate review of systems due to baseline dementia.  When asked review of systems questioning patient states "I do not know."  ____________________________________________   PHYSICAL EXAM:  Constitutional: Alert and oriented. Well appearing and in no distress. Eyes: Normal exam ENT   Head: Normocephalic and atraumatic   Mouth/Throat: Mucous membranes are moist. Cardiovascular: Normal rate, regular rhythm. No murmur Respiratory: Normal respiratory effort without tachypnea nor retractions. Breath sounds are clear  Gastrointestinal: Soft and nontender. No distention.   Musculoskeletal: Nontender with normal range of motion in all extremities. No lower extremity tenderness or edema. Neurologic:  Normal speech and language. No gross focal neurologic deficits Skin:  Skin is warm, dry and  intact.  Psychiatric: Mood and affect are normal.   ____________________________________________    EKG  EKG reviewed and interpreted by myself shows normal sinus rhythm at 52 bpm with a narrow QRS, left axis deviation, slightly prolonged PR interval otherwise normal intervals, nonspecific ST changes but no ST elevation.  ____________________________________________    RADIOLOGY  Chest xray negative  ____________________________________________   INITIAL IMPRESSION / ASSESSMENT AND PLAN / ED COURSE  Pertinent labs & imaging results that were available during my care of the patient were reviewed by me and considered in my medical decision making (see chart for details).  Patient presents to the emergency department for nausea vomiting earlier today and possible abdominal discomfort.  Differential would include gastroenteritis, enteritis, pancreatitis, gallbladder disease, ACS.  We will check labs, chest x-ray, EKG and closely monitor.  We will dose Zofran and IV fluids.  Reassuringly the patient has a completely benign abdominal  exam with no tenderness identified.  Results show hypokalemia, we will replete with IV and oral potassium.  Otherwise labs are largely at baseline.  Chest x-ray is clear.  Overall patient appears very well.  Will attempt to get a urinalysis.  He denies any complaints at this time.  I discussed results with the patient as well as his family who is now here, they are agreeable with the plan to discharge home with a prescription for Zofran and potassium and have the patient follow-up with his doctor this week.  I discussed return precautions for any further vomiting or complaints of pain or fever.  Patient states he is ready to go, states he feels well.  Is not been able to produce a urine sample.  I discussed this with the family continue to wait for the urine, they state they would prefer to go home as he has no urinary complaints.  They will follow-up with his  doctor for repeat checkup.  We will discharge with a short course of potassium and Zofran.  I discussed return precautions with the family.  ____________________________________________   FINAL CLINICAL IMPRESSION(S) / ED DIAGNOSES  nausea vomiting Hypokalemia   [name], MD 08/19/17 1726

## 2017-08-19 NOTE — ED Notes (Signed)
Patient's daughter, who is the patient's POA, states she was never contacted about or agreed to a DNR. Patient arrived with a yellow DNR form from the facility.

## 2017-08-19 NOTE — ED Triage Notes (Signed)
Per EMS report, patient c/o vomiting and abdominal discomfort from this morning. Patient is a resident of the [facility]. Patient arrived alert and oriented, c/o nausea.

## 2017-08-19 NOTE — ED Notes (Signed)
Patient taken to imaging. 

## 2017-08-24 ENCOUNTER — Emergency Department
Admission: EM | Admit: 2017-08-24 | Discharge: 2017-08-25 | Disposition: A | Discharge status: Home / Self Care | Payer: Medicare Other | Attending: Emergency Medicine | Admitting: Emergency Medicine

## 2017-08-24 ENCOUNTER — Emergency Department: Payer: Medicare Other

## 2017-08-24 ENCOUNTER — Other Ambulatory Visit: Payer: Self-pay

## 2017-08-24 DIAGNOSIS — Z8546 Personal history of malignant neoplasm of prostate: Secondary | ICD-10-CM | POA: Insufficient documentation

## 2017-08-24 DIAGNOSIS — Z8673 Personal history of transient ischemic attack (TIA), and cerebral infarction without residual deficits: Secondary | ICD-10-CM | POA: Insufficient documentation

## 2017-08-24 DIAGNOSIS — I2510 Atherosclerotic heart disease of native coronary artery without angina pectoris: Secondary | ICD-10-CM | POA: Diagnosis not present

## 2017-08-24 DIAGNOSIS — F419 Anxiety disorder, unspecified: Secondary | ICD-10-CM | POA: Diagnosis not present

## 2017-08-24 DIAGNOSIS — G8929 Other chronic pain: Secondary | ICD-10-CM

## 2017-08-24 DIAGNOSIS — Z79899 Other long term (current) drug therapy: Secondary | ICD-10-CM | POA: Diagnosis not present

## 2017-08-24 DIAGNOSIS — E119 Type 2 diabetes mellitus without complications: Secondary | ICD-10-CM | POA: Diagnosis not present

## 2017-08-24 DIAGNOSIS — M545 Low back pain: Secondary | ICD-10-CM

## 2017-08-24 DIAGNOSIS — G20 Parkinson's disease: Secondary | ICD-10-CM | POA: Diagnosis not present

## 2017-08-24 DIAGNOSIS — Z794 Long term (current) use of insulin: Secondary | ICD-10-CM | POA: Insufficient documentation

## 2017-08-24 DIAGNOSIS — F0390 Unspecified dementia without behavioral disturbance: Secondary | ICD-10-CM | POA: Insufficient documentation

## 2017-08-24 DIAGNOSIS — Z87891 Personal history of nicotine dependence: Secondary | ICD-10-CM | POA: Diagnosis not present

## 2017-08-24 DIAGNOSIS — Z7982 Long term (current) use of aspirin: Secondary | ICD-10-CM | POA: Insufficient documentation

## 2017-08-24 DIAGNOSIS — I10 Essential (primary) hypertension: Secondary | ICD-10-CM | POA: Diagnosis not present

## 2017-08-24 DIAGNOSIS — R079 Chest pain, unspecified: Secondary | ICD-10-CM | POA: Insufficient documentation

## 2017-08-24 LAB — URINALYSIS, COMPLETE (UACMP) WITH MICROSCOPIC
BACTERIA UA: NONE SEEN
Bilirubin Urine: NEGATIVE
GLUCOSE, UA: NEGATIVE mg/dL
Hgb urine dipstick: NEGATIVE
KETONES UR: NEGATIVE mg/dL
Leukocytes, UA: NEGATIVE
Nitrite: NEGATIVE
Protein, ur: 100 mg/dL — AB
SPECIFIC GRAVITY, URINE: 1.008 (ref 1.005–1.030)
SQUAMOUS EPITHELIAL / LPF: NONE SEEN
pH: 7 (ref 5.0–8.0)

## 2017-08-24 LAB — COMPREHENSIVE METABOLIC PANEL
ALK PHOS: 77 U/L (ref 38–126)
ALT: 6 U/L — ABNORMAL LOW (ref 17–63)
ANION GAP: 9 (ref 5–15)
AST: 35 U/L (ref 15–41)
Albumin: 3.2 g/dL — ABNORMAL LOW (ref 3.5–5.0)
BILIRUBIN TOTAL: 0.4 mg/dL (ref 0.3–1.2)
BUN: 18 mg/dL (ref 6–20)
CALCIUM: 8.6 mg/dL — AB (ref 8.9–10.3)
CO2: 26 mmol/L (ref 22–32)
Chloride: 98 mmol/L — ABNORMAL LOW (ref 101–111)
Creatinine, Ser: 0.76 mg/dL (ref 0.61–1.24)
GFR calc non Af Amer: >60 mL/min (ref >60)
Glucose, Bld: 119 mg/dL — ABNORMAL HIGH (ref 65–99)
Potassium: 3.3 mmol/L — ABNORMAL LOW (ref 3.5–5.1)
SODIUM: 133 mmol/L — AB (ref 135–145)
TOTAL PROTEIN: 6.1 g/dL — AB (ref 6.5–8.1)

## 2017-08-24 LAB — CBC WITH DIFFERENTIAL/PLATELET
BASOS ABS: 0 10*3/uL (ref 0–0.1)
Basophils Relative: 0 %
EOS PCT: 2 %
Eosinophils Absolute: 0.2 10*3/uL (ref 0–0.7)
HEMATOCRIT: 35.9 % — AB (ref 40.0–52.0)
Hemoglobin: 12 g/dL — ABNORMAL LOW (ref 13.0–18.0)
LYMPHS ABS: 1.5 10*3/uL (ref 1.0–3.6)
Lymphocytes Relative: 19 %
MCH: 28 pg (ref 26.0–34.0)
MCHC: 33.4 g/dL (ref 32.0–36.0)
MCV: 83.7 fL (ref 80.0–100.0)
MONO ABS: 0.6 10*3/uL (ref 0.2–1.0)
MONOS PCT: 7 %
Neutro Abs: 5.8 10*3/uL (ref 1.4–6.5)
Neutrophils Relative %: 72 %
PLATELETS: 284 10*3/uL (ref 150–440)
RBC: 4.29 MIL/uL — ABNORMAL LOW (ref 4.40–5.90)
RDW: 16.3 % — AB (ref 11.5–14.5)
WBC: 8.1 10*3/uL (ref 3.8–10.6)

## 2017-08-24 LAB — TROPONIN I: Troponin I: <0.03 ng/mL (ref <0.03)

## 2017-08-24 MED ORDER — SODIUM CHLORIDE 0.9 % IV BOLUS (SEPSIS)
500.0000 mL | Freq: Once | INTRAVENOUS | Status: AC
  Administered 2017-08-25: 500 mL via INTRAVENOUS

## 2017-08-24 MED ORDER — ONDANSETRON HCL 4 MG/2ML IJ SOLN
4.0000 mg | Freq: Once | INTRAMUSCULAR | Status: AC
  Administered 2017-08-25: 4 mg via INTRAVENOUS
  Filled 2017-08-24: qty 2

## 2017-08-24 MED ORDER — ASPIRIN 81 MG PO CHEW
324.0000 mg | CHEWABLE_TABLET | Freq: Once | ORAL | Status: AC
  Administered 2017-08-25: 324 mg via ORAL
  Filled 2017-08-24: qty 4

## 2017-08-24 NOTE — ED Triage Notes (Addendum)
Per EMS, pt from [facility] with reports of "chest fullness and lower back pain." EMS reports pt has hx of lower back pain and states "he may not have been given his tramadol tonight." Pt states the CP "isn't bad at all" at this time, states when it began he had some SHOB. Pt alert to self and event at this time, hx dementia per pt chart from [place].

## 2017-08-24 NOTE — ED Provider Notes (Signed)
[facility] Emergency Department Provider Note   ____________________________________________   First MD Initiated Contact with Patient 08/24/17 2324     (approximate)  I have reviewed the triage vital signs and the nursing notes.   HISTORY  Chief Complaint Chest Pain and Back Pain  Level 5 caveat: Limited by dementia  HPI Dennis Serrano is a 84 y.o. male brought to the ED via EMS from the [facility] with a chief complaint of lower back pain and chest fullness.  Patient has a history of chronic lower back pain for which he receives tramadol as needed.  Patient states he may not have been given his tramadol tonight.  Also complaining of epigastric discomfort which patient reports "is not bad at all".  Denies associated diaphoresis, shortness of breath, palpitations, dizziness, vomiting.  Reports some nausea prior to arrival.  Denies recent fever, chills, dysuria, diarrhea.  Denies recent travel or trauma.   Past Medical History:  Diagnosis Date  . Anxiety   . CAD (coronary artery disease)   . Dementia   . Diabetes mellitus without complication ([place])   . GERD (gastroesophageal reflux disease)   . History of heart artery stent   . HLD (hyperlipidemia)   . HTN (hypertension)   . Parkinson disease ([place])   . Prostate cancer ([place])    over 5 years ago    Patient Active Problem List   Diagnosis Date Noted  . TIA (transient ischemic attack) 10/20/2016  . Pressure injury of skin 10/07/2016  . Sepsis ([place]) 10/02/2016  . CAP (community acquired pneumonia) 10/02/2016  . HTN (hypertension) 10/02/2016  . HLD (hyperlipidemia) 10/02/2016  . CAD (coronary artery disease) 10/02/2016  . GERD (gastroesophageal reflux disease) 10/02/2016    Past Surgical History:  Procedure Laterality Date  . APPENDECTOMY    . CORONARY STENT PLACEMENT    . EYE SURGERY    . TRANSURETHRAL RESECTION OF PROSTATE      Prior to Admission medications   Medication Sig Start Date End Date  Taking? Authorizing Provider  acetaminophen (TYLENOL) 325 MG tablet Take 650 mg by mouth 3 (three) times daily as needed.    [provider]  albuterol (PROVENTIL HFA;VENTOLIN HFA) 108 (90 Base) MCG/ACT inhaler Inhale 2 puffs into the lungs every 6 (six) hours as needed for wheezing or shortness of breath. 10/07/16   [name], MD  aspirin 325 MG tablet Take 1 tablet (325 mg total) by mouth daily. Patient taking differently: Take 81 mg by mouth daily.  10/08/16   [name], MD  carbidopa-levodopa (SINEMET IR) 25-100 MG tablet Take 1 tablet by mouth 3 (three) times daily.    [provider]  celecoxib (CELEBREX) 200 MG capsule Take 200 mg by mouth 2 (two) times daily.    [provider]  cyclobenzaprine (FLEXERIL) 5 MG tablet Take 5 mg by mouth 3 (three) times daily as needed for muscle spasms.    [provider]  diazepam (VALIUM) 5 MG tablet Take 5 mg by mouth every 6 (six) hours as needed for anxiety.    [provider]  diclofenac sodium (VOLTAREN) 1 % GEL Apply 2 g topically 4 (four) times daily.    [provider]  diltiazem (CARDIZEM) 60 MG tablet Take 1 tablet (60 mg total) by mouth every 6 (six) hours. Patient taking differently: Take 360 mg by mouth daily.  10/08/16   [name], MD  docusate sodium (COLACE) 100 MG capsule Take 100 mg by mouth 2 (two)  times daily.    [provider]  hydrocerin (EUCERIN) CREA Apply 1 application topically daily.    [provider]  insulin detemir (LEVEMIR) 100 UNIT/ML injection Inject 0.12 mLs (12 Units total) into the skin at bedtime. 10/07/16   [name], MD  insulin NPH Human (HUMULIN N,NOVOLIN N) 100 UNIT/ML injection Inject 20-22 Units into the skin 2 (two) times daily before a meal.    [provider]  ketoconazole (NIZORAL) 2 % shampoo Apply 1 application topically 2 (two) times a week.    [provider]  lisinopril  (PRINIVIL,ZESTRIL) 40 MG tablet Take 40 mg by mouth daily.    [provider]  Magnesium Gluconate 500 (27 Mg) MG TABS Take 1,000 mg by mouth 3 (three) times daily.    [provider]  metaxalone (SKELAXIN) 400 MG tablet Take 1 tablet (400 mg total) by mouth 3 (three) times daily. Patient not taking: Reported on 08/18/2017 10/07/16   [name], MD  ondansetron (ZOFRAN ODT) 4 MG disintegrating tablet Take 1 tablet (4 mg total) by mouth every 8 (eight) hours as needed for nausea or vomiting. 08/19/17   [name], MD  polyethylene glycol powder (GAVILAX) powder Take 17 g by mouth daily.    [provider]  potassium chloride (K-DUR,KLOR-CON) 10 MEQ tablet Take 1 tablet (10 mEq total) by mouth 2 (two) times daily. 08/19/17   [name], MD  QUEtiapine (SEROQUEL) 25 MG tablet Take 1 tablet (25 mg total) by mouth at bedtime. Patient not taking: Reported on 08/18/2017 10/07/16   [name], MD  traMADol (ULTRAM) 50 MG tablet Take 1 tablet (50 mg total) by mouth every 6 (six) hours as needed. 06/13/17   [name], MD    Allergies Patient has no known allergies.  Family History  Problem Relation Age of Onset  . Heart disease Mother   . Heart disease Father   . Cancer Sister   . Cancer Brother     Social History Social History   Tobacco Use  . Smoking status: Former Smoker    Types: Cigarettes    Last attempt to quit: 1980    Years since quitting: 39.1  . Smokeless tobacco: Never Used  Substance Use Topics  . Alcohol use: No  . Drug use: No    Review of Systems  Constitutional: No fever/chills. Eyes: No visual changes. ENT: No sore throat. Cardiovascular: Positive for chest pain. Respiratory: Denies shortness of breath. Gastrointestinal: Positive for abdominal pain.  Positive for nausea, no vomiting.  No diarrhea.  No constipation. Genitourinary: Negative for dysuria. Musculoskeletal: Positive for back pain. Skin:  Negative for rash. Neurological: Negative for headaches, focal weakness or numbness.   ____________________________________________   PHYSICAL EXAM:  VITAL SIGNS: ED Triage Vitals  Enc Vitals Group     BP 08/24/17 2320 (!) 153/88     Pulse Rate 08/24/17 2320 (!) 58     Resp 08/24/17 2320 19     Temp --      Temp src --      SpO2 08/24/17 2320 98 %     Weight 08/24/17 2322 150 lb (68 kg)     Height 08/24/17 2322 5\' 9"  (1.753 m)     Head Circumference --      Peak Flow --      Pain Score 08/24/17 2321 4     Pain Loc --      Pain Edu? --      Excl. in  GC? --     Constitutional: Alert and oriented. Well appearing and in no acute distress. Eyes: Conjunctivae are normal. PERRL. EOMI. Head: Atraumatic. Nose: No congestion/rhinnorhea. Mouth/Throat: Mucous membranes are moist.  Oropharynx non-erythematous. Neck: No stridor.   Cardiovascular: Normal rate, regular rhythm. Grossly normal heart sounds.  Good peripheral circulation. Respiratory: Normal respiratory effort.  No retractions. Lungs CTAB. Gastrointestinal: Soft and mildly tender to palpation epigastrium without rebound or guarding. No distention. No abdominal bruits. No CVA tenderness. Musculoskeletal: No lower extremity tenderness nor edema.  No joint effusions. Neurologic: Alert and oriented to person and place.  Normal speech and language. No gross focal neurologic deficits are appreciated.  Skin:  Skin is warm, dry and intact. No rash noted. Psychiatric: Mood and affect are normal. Speech and behavior are normal.  ____________________________________________   LABS (all labs ordered are listed, but only abnormal results are displayed)  Labs Reviewed  CBC WITH DIFFERENTIAL/PLATELET - Abnormal; Notable for the following components:      Result Value   RBC 4.29 (*)    Hemoglobin 12.0 (*)    HCT 35.9 (*)    RDW 16.3 (*)    All other components within normal limits  COMPREHENSIVE METABOLIC PANEL - Abnormal; Notable  for the following components:   Sodium 133 (*)    Potassium 3.3 (*)    Chloride 98 (*)    Glucose, Bld 119 (*)    Calcium 8.6 (*)    Total Protein 6.1 (*)    Albumin 3.2 (*)    ALT 6 (*)    All other components within normal limits  URINALYSIS, COMPLETE (UACMP) WITH MICROSCOPIC - Abnormal; Notable for the following components:   Color, Urine STRAW (*)    APPearance CLEAR (*)    Protein, ur 100 (*)    All other components within normal limits  TROPONIN I  LIPASE, BLOOD  TROPONIN I   ____________________________________________  EKG  ED ECG REPORT I, Jaleen Grupp J, the attending physician, personally viewed and interpreted this ECG.   Date: 08/24/2017  EKG Time: 2319  Rate: 59  Rhythm: normal EKG, normal sinus rhythm  Axis: LAD  Intervals:left anterior fascicular block  ST&T Change: Nonspecific  ____________________________________________  RADIOLOGY  ED MD interpretation: No acute cardiopulmonary process  Official radiology report(s): Dg Chest Port 1 View  Result Date: 08/24/2017 CLINICAL DATA:  84 y/o  M; chest pain and shortness of breath. EXAM: PORTABLE CHEST 1 VIEW COMPARISON:  08/19/2017 chest radiograph. FINDINGS: Stable cardiac silhouette within normal limits given projection and technique. Aortic atherosclerosis with calcification. Clear lungs. No pleural effusion or pneumothorax. No acute osseous abnormality is evident. IMPRESSION: No active disease. Electronically Signed   By: Kristine Garbe M.D.   On: 08/24/2017 23:48    ____________________________________________   PROCEDURES  Procedure(s) performed: None  Procedures  Critical Care performed: No  ____________________________________________   INITIAL IMPRESSION / ASSESSMENT AND PLAN / ED COURSE  As part of my medical decision making, I reviewed the following data within the [facility] notes reviewed and incorporated, Labs reviewed, EKG interpreted, Old chart  reviewed, Radiograph reviewed and Notes from prior ED visits.   84 year old male with dementia who presents with chest and lower back pain. Differential diagnosis includes, but is not limited to, ACS, aortic dissection, pulmonary embolism, cardiac tamponade, pneumothorax, pneumonia, pericarditis, myocarditis, GI-related causes including esophagitis/gastritis, and musculoskeletal chest wall pain.    Patient points to epigastrium the source of his chest discomfort; will check LFTs  and lipase in addition to cardiac panel.  Patient reports and his records demonstrate chronic lower back pain for which he takes tramadol.  Will administer aspirin, antiemetic and reassess.  Clinical Course as of Aug 25 504  Fri Aug 25, 2017  0220 Patient resting no acute distress.  Initial troponin unremarkable.  Will repeat timed troponin.  [JS]  0505 Delay secondary to other critical patients.  Patient sleeping no acute distress.  Repeat troponin remains negative.  Voices no complaints of chest or back pain.  Will discharge back to facility and patient will follow-up closely with his PCP.  Strict return precautions given.  Patient verbalizes understanding and agrees with plan of care.  [JS]    Clinical Course User Index [JS] [name], MD     ____________________________________________   FINAL CLINICAL IMPRESSION(S) / ED DIAGNOSES  Final diagnoses:  Chest pain, unspecified type  Chronic midline low back pain without sciatica     ED Discharge Orders    None       Note:  This document was prepared using Dragon voice recognition software and may include unintentional dictation errors.    [name], MD 08/25/17 [phone]

## 2017-08-25 LAB — LIPASE, BLOOD: LIPASE: 23 U/L (ref 11–51)

## 2017-08-25 LAB — TROPONIN I

## 2017-08-25 NOTE — ED Notes (Signed)
No return phone call from [facility] at this time.

## 2017-08-25 NOTE — Discharge Instructions (Signed)
Continue all medications as directed by your doctor.  Return to the ER for worsening symptoms, persistent vomiting, difficulty breathing or other concerns. °

## 2017-08-25 NOTE — ED Notes (Addendum)
Attempted to call pt's daughter [name] x2, no answer at this time. Spoke with other number listed - [name] who verbalizes understanding of pt's current plan of care.

## 2017-08-25 NOTE — ED Notes (Signed)
Pt's grand daughter Rojelio Brenner notified pt is being discharged back to [facility] by EMS, [employer] understanding of this.

## 2018-08-03 IMAGING — DX DG FOOT 2V*R*
3 series · 3 of 3 positions shown · non-contrast
Comparison: None.

CLINICAL DATA: 83 y/o  M; 2 days of foot pain.  No injury.

EXAM:
RIGHT FOOT - 2 VIEW

[foot ap (1 of 2)]
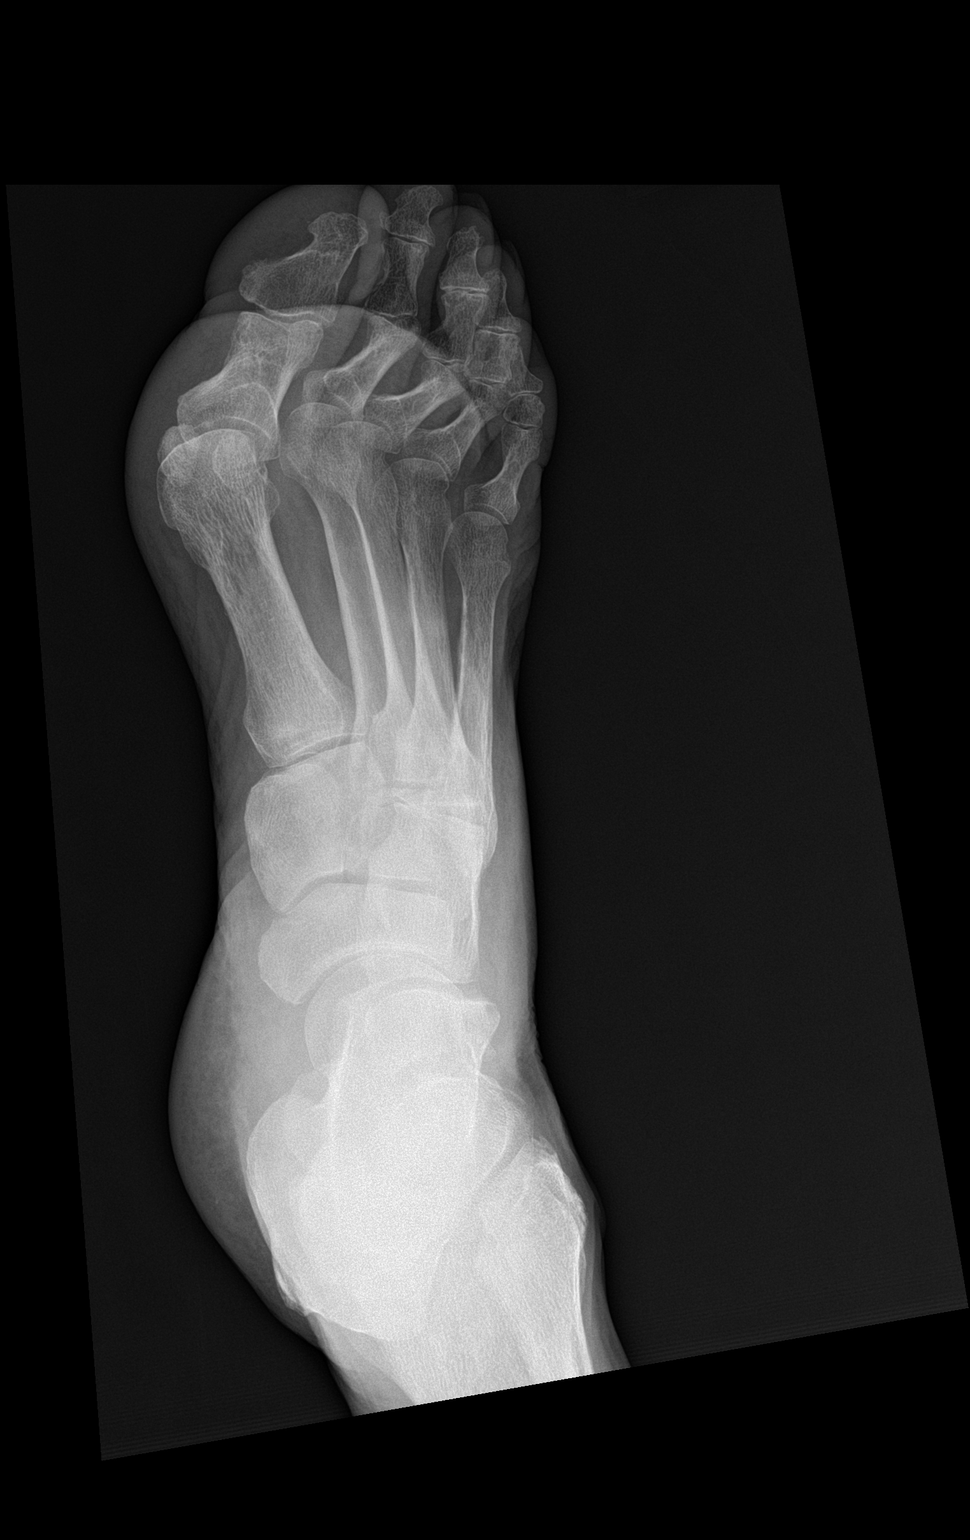

[foot lat]
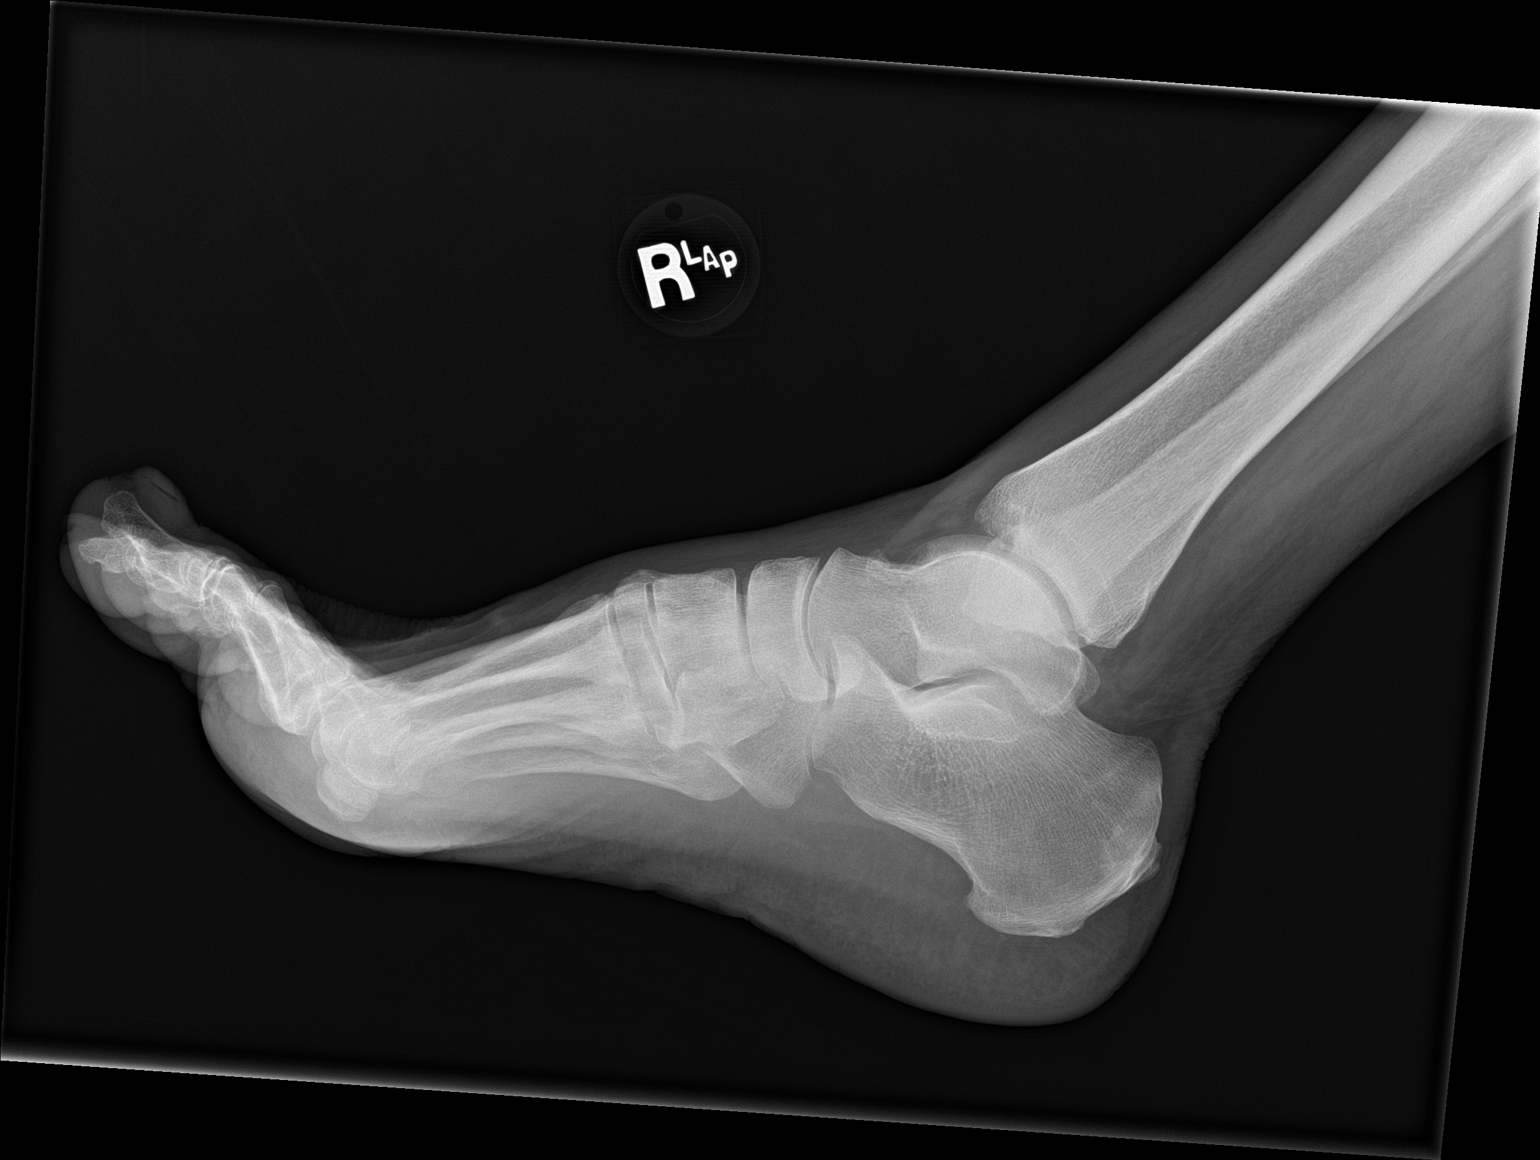

[foot ap (2 of 2)]
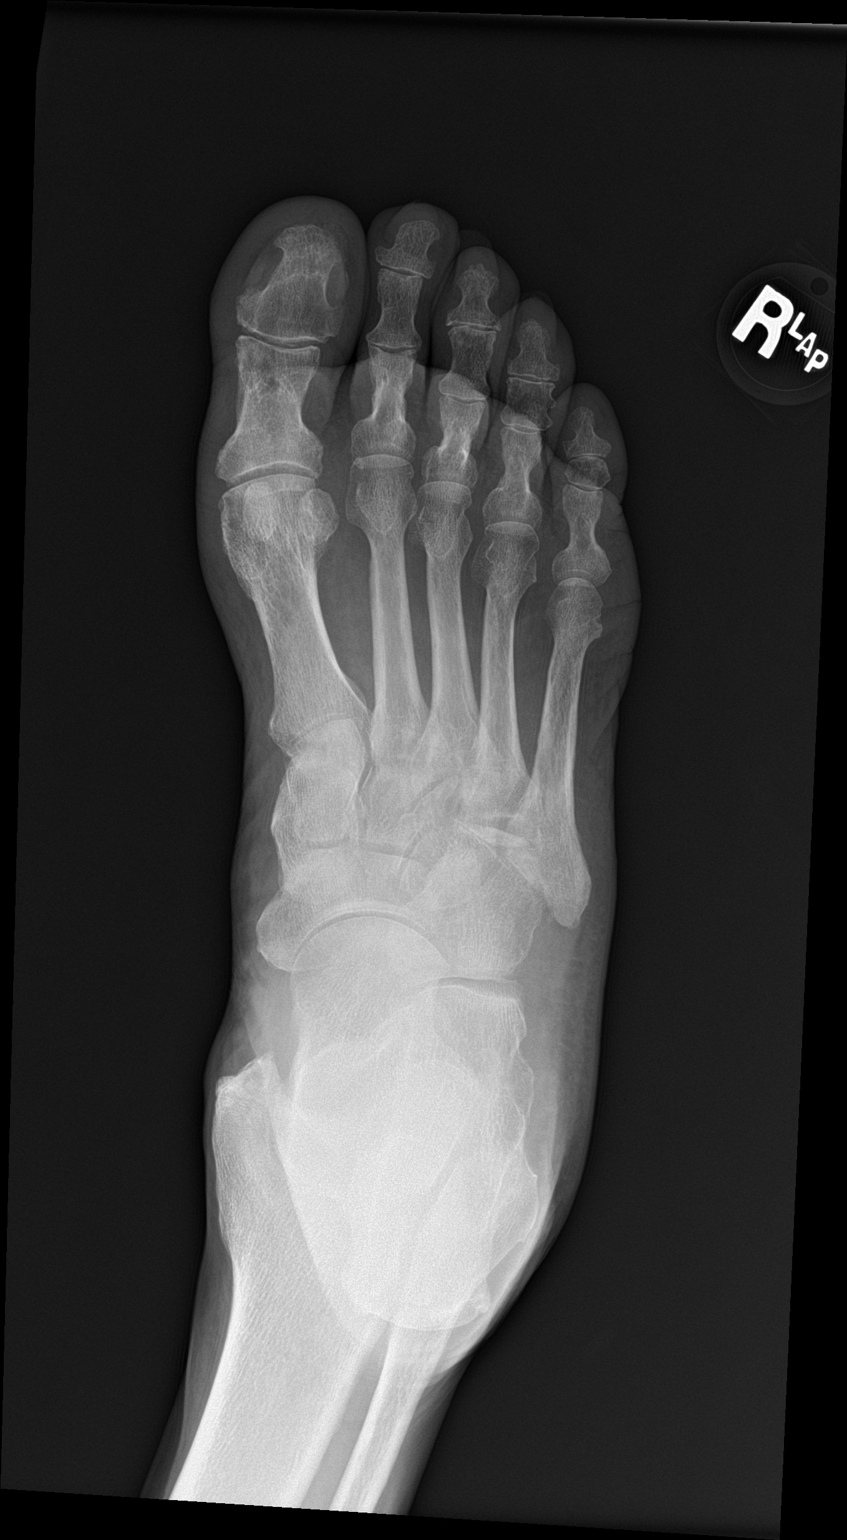

[3 of 3 positions shown; findings below may reference images not displayed]

FINDINGS: There is no evidence of fracture or dislocation. There is no
evidence of arthropathy or other focal bone abnormality. Soft
tissues are unremarkable.
IMPRESSION: Negative.

By: Hendarto Cahyo Iie M.D.

## 2018-08-17 IMAGING — CT CT HEAD W/O CM
4 series · 16 of 47 positions shown, 18 images · non-contrast
Comparison: None.

CLINICAL DATA: Unwitnessed fall headache

EXAM:
CT HEAD WITHOUT CONTRAST
TECHNIQUE: Contiguous axial images were obtained from the base of the skull
through the vertex without intravenous contrast.

[Series 2: head wo · axial · 0.44mm/px · z∈[-152,-42]mm · 6 of 32 slices shown, 8 images]
[im 5/32  brain]
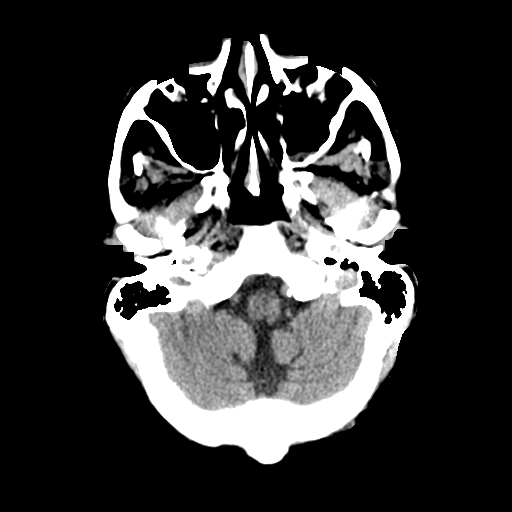
[im 5/32  bone]
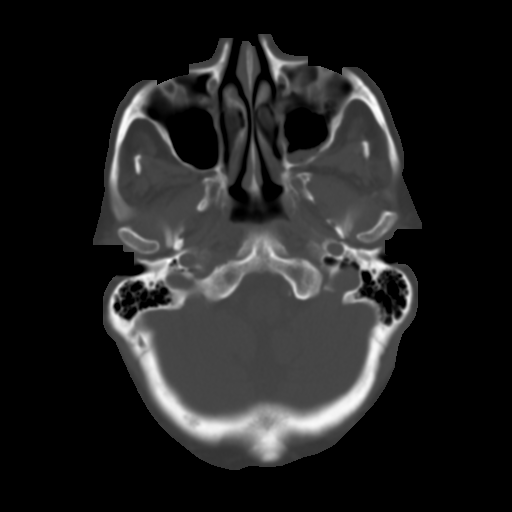
[im 9/32  brain]
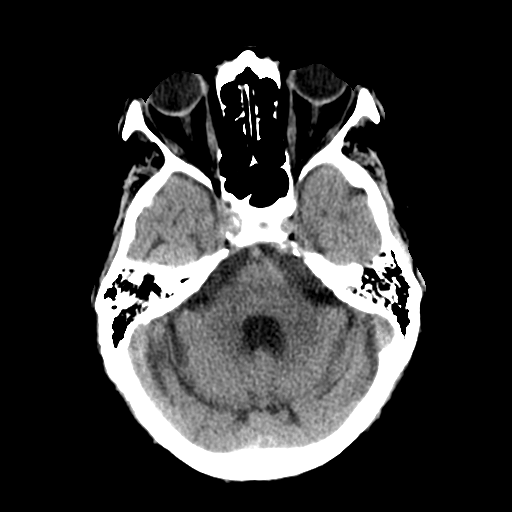
[im 14/32  brain]
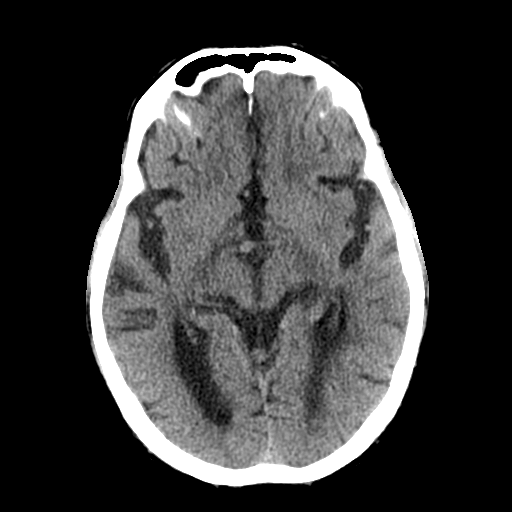
[im 18/32  brain]
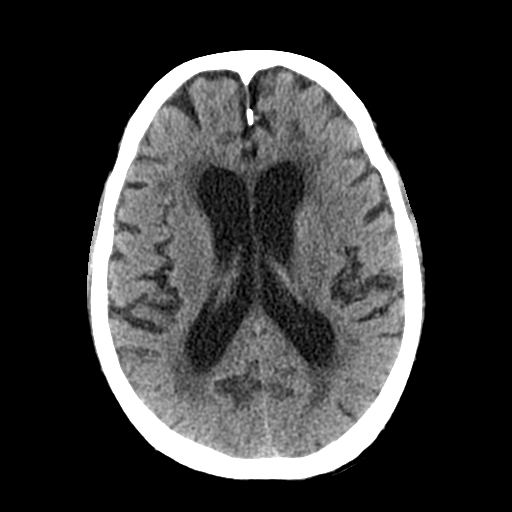
[im 23/32  brain]
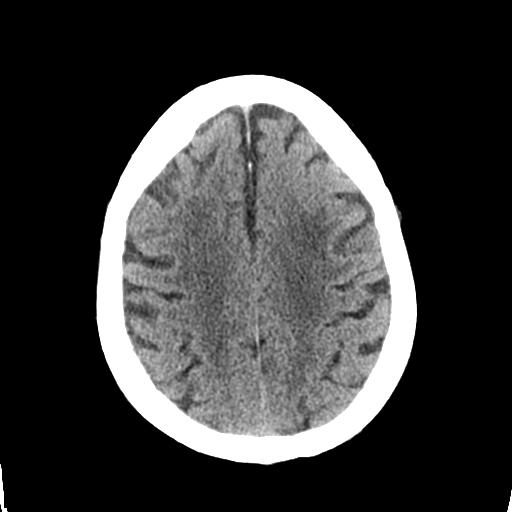
[im 23/32  bone]
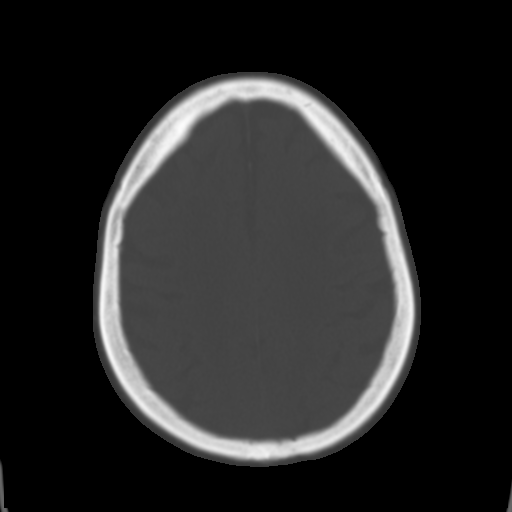
[im 27/32  brain]
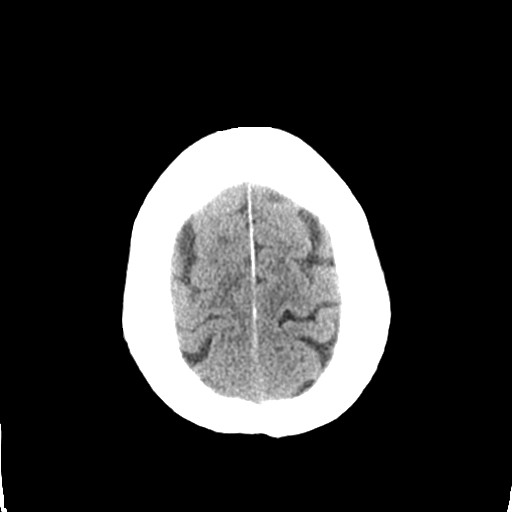

[Series 3: head bone · axial · 0.44mm/px · z∈[-158,-104]mm · 4 of 81 slices shown]
[im 8/81  bone]
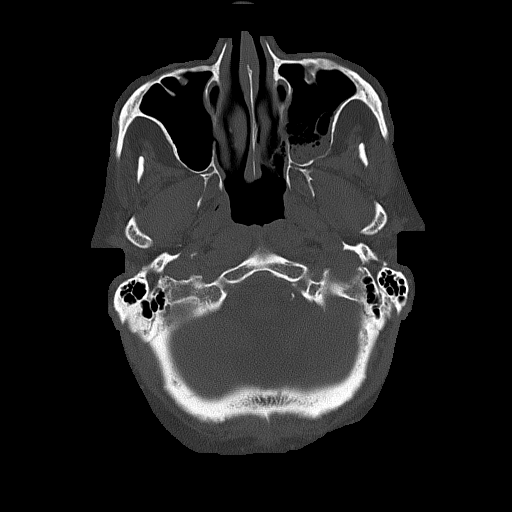
[im 16/81  bone]
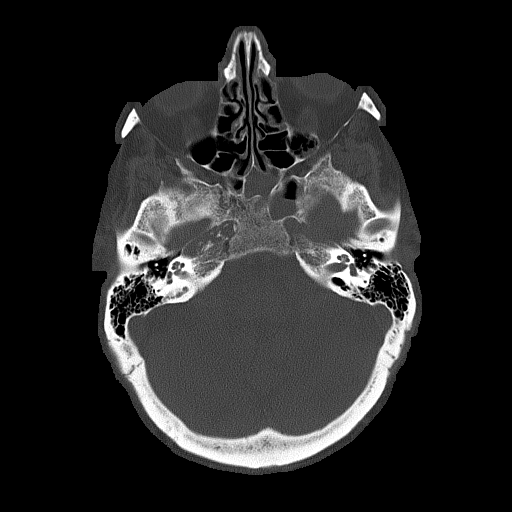
[im 27/81  bone]
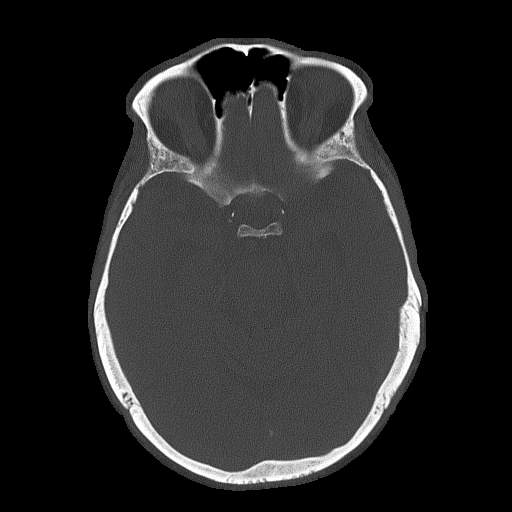
[im 35/81  bone]
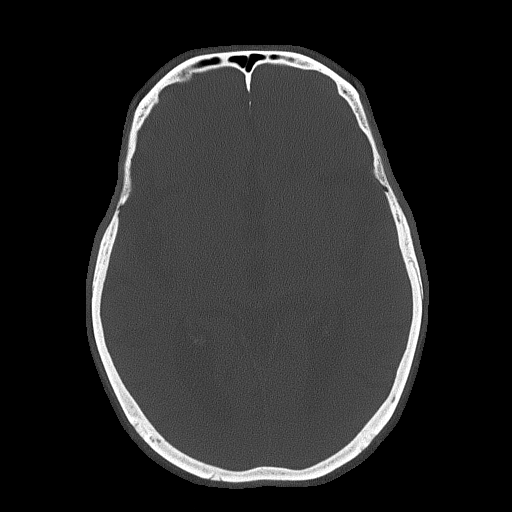

[Series 4: coronal soft tissue · coronal · 0.33mm/px · 3 of 66 slices shown]
[im 22/66  brain]
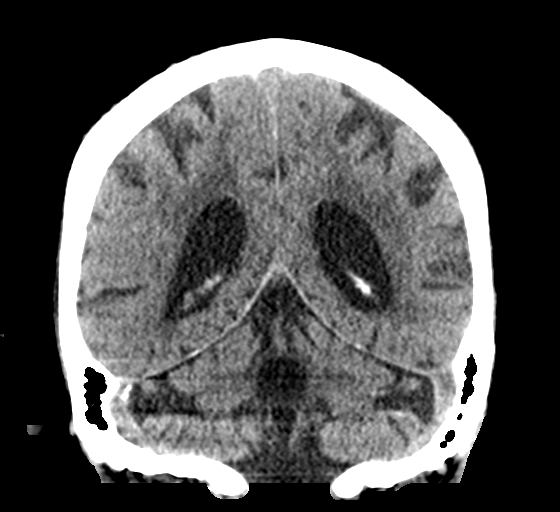
[im 29/66  brain]
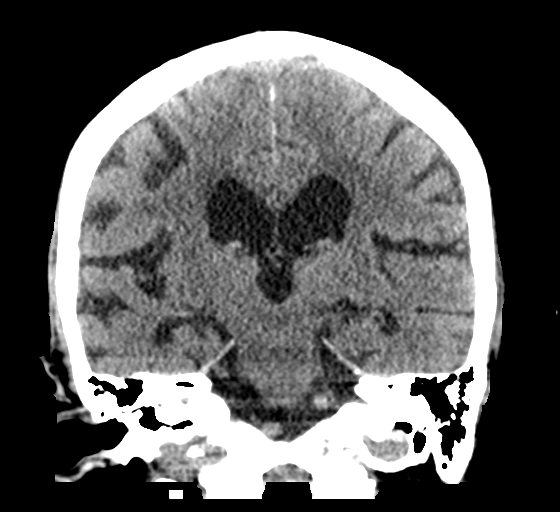
[im 37/66  brain]
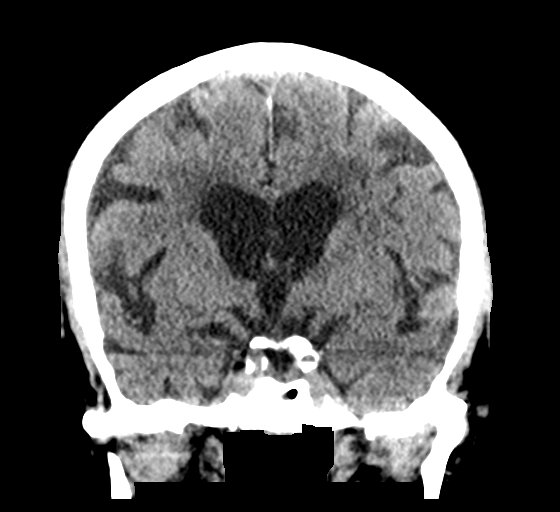

[Series 5: sagittal soft tissue · sagittal · 0.33mm/px · 3 of 52 slices shown]
[im 18/52  brain]
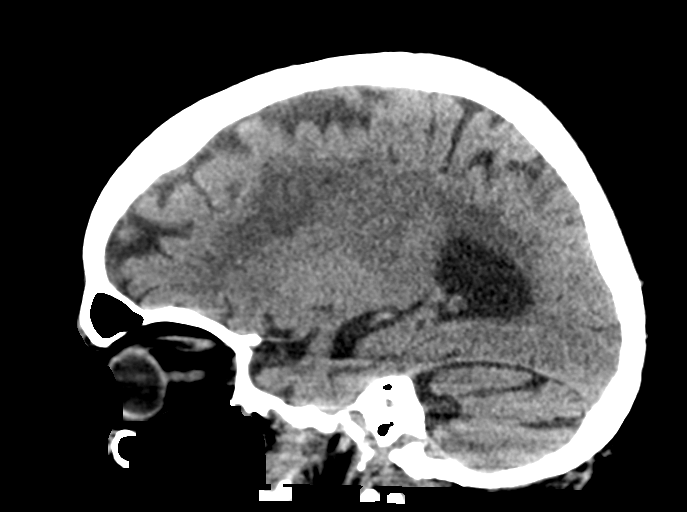
[im 26/52  brain]
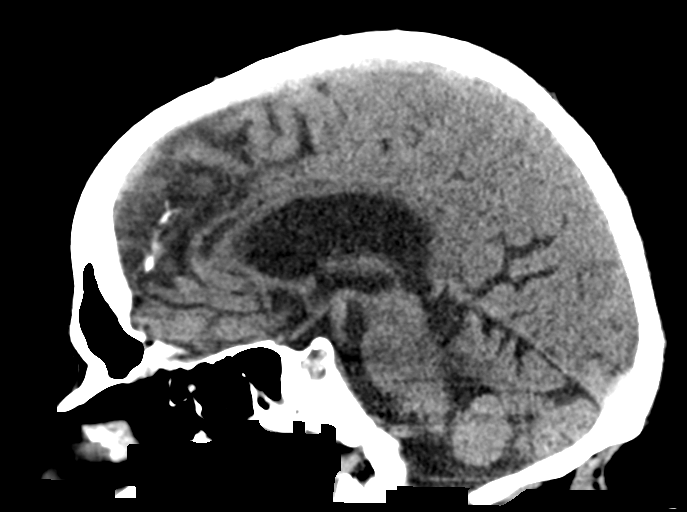
[im 35/52  brain]
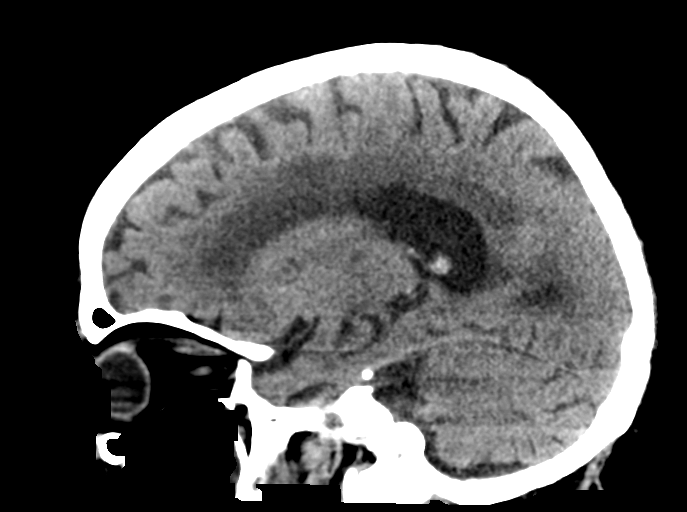

[16 of 47 positions shown; findings below may reference images not displayed]

FINDINGS: Brain: No acute territorial infarction, hemorrhage or focal mass
lesion is visualized. Mild to moderate atrophy. Moderate
periventricular and subcortical white matter small vessel ischemic
changes. Ventricular prominence is felt secondary to atrophy.
Probable tiny old lacunar infarcts in the basal ganglia

Vascular: No hyperdense vessels.  Carotid artery calcifications.

Skull: No fracture or suspicious bone lesion.

Sinuses/Orbits: Fluid levels in the left maxillary and sphenoid
sinuses. Mucosal thickening in the ethmoid sinuses. No acute orbital
abnormality. Bilateral lens extraction.

Other: None
IMPRESSION: 1. No CT evidence for acute intracranial abnormality.
2. Moderate small vessel ischemic changes of the white matter
3. Sinusitis

## 2018-09-02 ENCOUNTER — Other Ambulatory Visit: Payer: Self-pay

## 2018-09-02 ENCOUNTER — Inpatient Hospital Stay
Admission: EM | Admit: 2018-09-02 | Discharge: 2018-09-06 | DRG: 918 | Disposition: A | Discharge status: Skilled Nursing Facility | Payer: Medicare Other | Source: Skilled Nursing Facility | Attending: Internal Medicine | Admitting: Internal Medicine

## 2018-09-02 DIAGNOSIS — I959 Hypotension, unspecified: Secondary | ICD-10-CM | POA: Diagnosis present

## 2018-09-02 DIAGNOSIS — Z794 Long term (current) use of insulin: Secondary | ICD-10-CM

## 2018-09-02 DIAGNOSIS — E11649 Type 2 diabetes mellitus with hypoglycemia without coma: Secondary | ICD-10-CM | POA: Diagnosis present

## 2018-09-02 DIAGNOSIS — T466X1A Poisoning by antihyperlipidemic and antiarteriosclerotic drugs, accidental (unintentional), initial encounter: Secondary | ICD-10-CM | POA: Diagnosis present

## 2018-09-02 DIAGNOSIS — T383X1A Poisoning by insulin and oral hypoglycemic [antidiabetic] drugs, accidental (unintentional), initial encounter: Secondary | ICD-10-CM | POA: Diagnosis not present

## 2018-09-02 DIAGNOSIS — G20 Parkinson's disease: Secondary | ICD-10-CM | POA: Diagnosis present

## 2018-09-02 DIAGNOSIS — T424X1A Poisoning by benzodiazepines, accidental (unintentional), initial encounter: Secondary | ICD-10-CM | POA: Diagnosis present

## 2018-09-02 DIAGNOSIS — F419 Anxiety disorder, unspecified: Secondary | ICD-10-CM | POA: Diagnosis present

## 2018-09-02 DIAGNOSIS — E860 Dehydration: Secondary | ICD-10-CM | POA: Diagnosis present

## 2018-09-02 DIAGNOSIS — E871 Hypo-osmolality and hyponatremia: Secondary | ICD-10-CM | POA: Diagnosis present

## 2018-09-02 DIAGNOSIS — Z8546 Personal history of malignant neoplasm of prostate: Secondary | ICD-10-CM

## 2018-09-02 DIAGNOSIS — T887XXA Unspecified adverse effect of drug or medicament, initial encounter: Secondary | ICD-10-CM

## 2018-09-02 DIAGNOSIS — I2510 Atherosclerotic heart disease of native coronary artery without angina pectoris: Secondary | ICD-10-CM | POA: Diagnosis present

## 2018-09-02 DIAGNOSIS — Z8249 Family history of ischemic heart disease and other diseases of the circulatory system: Secondary | ICD-10-CM

## 2018-09-02 DIAGNOSIS — D649 Anemia, unspecified: Secondary | ICD-10-CM | POA: Diagnosis present

## 2018-09-02 DIAGNOSIS — Z7982 Long term (current) use of aspirin: Secondary | ICD-10-CM

## 2018-09-02 DIAGNOSIS — K219 Gastro-esophageal reflux disease without esophagitis: Secondary | ICD-10-CM | POA: Diagnosis present

## 2018-09-02 DIAGNOSIS — E785 Hyperlipidemia, unspecified: Secondary | ICD-10-CM | POA: Diagnosis present

## 2018-09-02 DIAGNOSIS — T472X1A Poisoning by stimulant laxatives, accidental (unintentional), initial encounter: Secondary | ICD-10-CM | POA: Diagnosis present

## 2018-09-02 DIAGNOSIS — T501X1A Poisoning by loop [high-ceiling] diuretics, accidental (unintentional), initial encounter: Secondary | ICD-10-CM | POA: Diagnosis present

## 2018-09-02 DIAGNOSIS — Z87891 Personal history of nicotine dependence: Secondary | ICD-10-CM

## 2018-09-02 DIAGNOSIS — I10 Essential (primary) hypertension: Secondary | ICD-10-CM | POA: Diagnosis present

## 2018-09-02 DIAGNOSIS — T461X1A Poisoning by calcium-channel blockers, accidental (unintentional), initial encounter: Secondary | ICD-10-CM | POA: Diagnosis present

## 2018-09-02 DIAGNOSIS — T381X1A Poisoning by thyroid hormones and substitutes, accidental (unintentional), initial encounter: Secondary | ICD-10-CM | POA: Diagnosis present

## 2018-09-02 DIAGNOSIS — Z955 Presence of coronary angioplasty implant and graft: Secondary | ICD-10-CM

## 2018-09-02 DIAGNOSIS — T50901A Poisoning by unspecified drugs, medicaments and biological substances, accidental (unintentional), initial encounter: Secondary | ICD-10-CM | POA: Diagnosis present

## 2018-09-02 DIAGNOSIS — I444 Left anterior fascicular block: Secondary | ICD-10-CM | POA: Diagnosis present

## 2018-09-02 DIAGNOSIS — T404X1A Poisoning by other synthetic narcotics, accidental (unintentional), initial encounter: Secondary | ICD-10-CM | POA: Diagnosis present

## 2018-09-02 DIAGNOSIS — T471X1A Poisoning by other antacids and anti-gastric-secretion drugs, accidental (unintentional), initial encounter: Secondary | ICD-10-CM | POA: Diagnosis present

## 2018-09-02 DIAGNOSIS — T465X1A Poisoning by other antihypertensive drugs, accidental (unintentional), initial encounter: Secondary | ICD-10-CM | POA: Diagnosis present

## 2018-09-02 DIAGNOSIS — T441X1A Poisoning by other parasympathomimetics [cholinergics], accidental (unintentional), initial encounter: Secondary | ICD-10-CM | POA: Diagnosis present

## 2018-09-02 DIAGNOSIS — Z79899 Other long term (current) drug therapy: Secondary | ICD-10-CM

## 2018-09-02 DIAGNOSIS — F0280 Dementia in other diseases classified elsewhere without behavioral disturbance: Secondary | ICD-10-CM | POA: Diagnosis present

## 2018-09-02 DIAGNOSIS — T428X1A Poisoning by antiparkinsonism drugs and other central muscle-tone depressants, accidental (unintentional), initial encounter: Secondary | ICD-10-CM | POA: Diagnosis present

## 2018-09-02 DIAGNOSIS — Y92099 Unspecified place in other non-institutional residence as the place of occurrence of the external cause: Secondary | ICD-10-CM

## 2018-09-02 DIAGNOSIS — T45521A Poisoning by antithrombotic drugs, accidental (unintentional), initial encounter: Secondary | ICD-10-CM | POA: Diagnosis present

## 2018-09-02 DIAGNOSIS — T452X1A Poisoning by vitamins, accidental (unintentional), initial encounter: Secondary | ICD-10-CM | POA: Diagnosis present

## 2018-09-02 LAB — CBC WITH DIFFERENTIAL/PLATELET
Abs Immature Granulocytes: 0.02 10*3/uL (ref 0.00–0.07)
BASOS PCT: 0 %
Basophils Absolute: 0 10*3/uL (ref 0.0–0.1)
EOS ABS: 0.2 10*3/uL (ref 0.0–0.5)
EOS PCT: 3 %
HCT: 29.6 % — ABNORMAL LOW (ref 39.0–52.0)
Hemoglobin: 10 g/dL — ABNORMAL LOW (ref 13.0–17.0)
Immature Granulocytes: 0 %
LYMPHS ABS: 1.3 10*3/uL (ref 0.7–4.0)
Lymphocytes Relative: 22 %
MCH: 29.1 pg (ref 26.0–34.0)
MCHC: 33.8 g/dL (ref 30.0–36.0)
MCV: 86 fL (ref 80.0–100.0)
Monocytes Absolute: 0.5 10*3/uL (ref 0.1–1.0)
Monocytes Relative: 9 %
Neutro Abs: 3.9 10*3/uL (ref 1.7–7.7)
Neutrophils Relative %: 66 %
PLATELETS: 331 10*3/uL (ref 150–400)
RBC: 3.44 MIL/uL — ABNORMAL LOW (ref 4.22–5.81)
RDW: 13.2 % (ref 11.5–15.5)
WBC: 5.9 10*3/uL (ref 4.0–10.5)
nRBC: 0 % (ref 0.0–0.2)

## 2018-09-02 LAB — URINALYSIS, COMPLETE (UACMP) WITH MICROSCOPIC
Bilirubin Urine: NEGATIVE
Glucose, UA: NEGATIVE mg/dL
Hgb urine dipstick: NEGATIVE
Ketones, ur: NEGATIVE mg/dL
Leukocytes,Ua: NEGATIVE
Nitrite: NEGATIVE
PH: 6 (ref 5.0–8.0)
Protein, ur: 100 mg/dL — AB
SPECIFIC GRAVITY, URINE: 1.006 (ref 1.005–1.030)

## 2018-09-02 LAB — BASIC METABOLIC PANEL
ANION GAP: 9 (ref 5–15)
BUN: 19 mg/dL (ref 8–23)
CO2: 23 mmol/L (ref 22–32)
Calcium: 9 mg/dL (ref 8.9–10.3)
Chloride: 98 mmol/L (ref 98–111)
Creatinine, Ser: 1.07 mg/dL (ref 0.61–1.24)
Glucose, Bld: 104 mg/dL — ABNORMAL HIGH (ref 70–99)
POTASSIUM: 3.8 mmol/L (ref 3.5–5.1)
SODIUM: 130 mmol/L — AB (ref 135–145)

## 2018-09-02 LAB — URINE DRUG SCREEN, QUALITATIVE (ARMC ONLY)
Amphetamines, Ur Screen: NOT DETECTED
Barbiturates, Ur Screen: NOT DETECTED
Benzodiazepine, Ur Scrn: NOT DETECTED
Cannabinoid 50 Ng, Ur ~~LOC~~: NOT DETECTED
Cocaine Metabolite,Ur ~~LOC~~: NOT DETECTED
MDMA (Ecstasy)Ur Screen: NOT DETECTED
Methadone Scn, Ur: NOT DETECTED
Opiate, Ur Screen: NOT DETECTED
Phencyclidine (PCP) Ur S: NOT DETECTED
Tricyclic, Ur Screen: NOT DETECTED

## 2018-09-02 LAB — TROPONIN I: Troponin I: <0.03 ng/mL (ref <0.03)

## 2018-09-02 MED ORDER — ACETAMINOPHEN 325 MG PO TABS: 650.0000 mg | ORAL_TABLET | Freq: Four times a day (QID) | ORAL | Status: DC | PRN

## 2018-09-02 MED ORDER — SODIUM CHLORIDE 0.9 % IV BOLUS
500.0000 mL | Freq: Once | INTRAVENOUS | Status: AC
  Administered 2018-09-02: 500 mL via INTRAVENOUS

## 2018-09-02 MED ORDER — ONDANSETRON HCL 4 MG PO TABS: 4.0000 mg | ORAL_TABLET | Freq: Four times a day (QID) | ORAL | Status: DC | PRN

## 2018-09-02 MED ORDER — ONDANSETRON HCL 4 MG/2ML IJ SOLN: 4.0000 mg | Freq: Four times a day (QID) | INTRAMUSCULAR | Status: DC | PRN

## 2018-09-02 MED ORDER — SENNOSIDES-DOCUSATE SODIUM 8.6-50 MG PO TABS
1.0000 | ORAL_TABLET | Freq: Every evening | ORAL | Status: DC | PRN
  Administered 2018-09-04: 1 via ORAL

## 2018-09-02 MED ORDER — CARBIDOPA-LEVODOPA 25-100 MG PO TABS
1.0000 | ORAL_TABLET | Freq: Three times a day (TID) | ORAL | Status: DC
  Administered 2018-09-03 – 2018-09-06 (×11): 1 via ORAL
  Filled 2018-09-02 (×13): qty 1

## 2018-09-02 MED ORDER — ACETAMINOPHEN 650 MG RE SUPP: 650.0000 mg | Freq: Four times a day (QID) | RECTAL | Status: DC | PRN

## 2018-09-02 MED ORDER — ALBUTEROL SULFATE (2.5 MG/3ML) 0.083% IN NEBU: 3.0000 mL | INHALATION_SOLUTION | Freq: Four times a day (QID) | RESPIRATORY_TRACT | Status: DC | PRN

## 2018-09-02 MED ORDER — ENOXAPARIN SODIUM 40 MG/0.4ML ~~LOC~~ SOLN
40.0000 mg | SUBCUTANEOUS | Status: DC
  Administered 2018-09-02 – 2018-09-04 (×3): 40 mg via SUBCUTANEOUS
  Filled 2018-09-02 (×4): qty 0.4

## 2018-09-02 MED ORDER — VITAMIN D3 25 MCG (1000 UNIT) PO TABS
2000.0000 [IU] | ORAL_TABLET | Freq: Every day | ORAL | Status: DC
  Administered 2018-09-03 – 2018-09-06 (×4): 2000 [IU] via ORAL
  Filled 2018-09-02 (×4): qty 2

## 2018-09-02 MED ORDER — SODIUM CHLORIDE 0.9 % IV SOLN
INTRAVENOUS | Status: DC
  Administered 2018-09-02: 23:00:00 via INTRAVENOUS

## 2018-09-02 NOTE — ED Notes (Signed)
Upon moving pt from stretcher to bed pt was covered in feces. Pt was cleaned and clothes changed to gown. Pt now has warm blanket and is awaiting EDP NAD.

## 2018-09-02 NOTE — ED Notes (Addendum)
ED TO INPATIENT HANDOFF REPORT  ED Nurse Name and Phone #:  Gershon Mussel RN   [phone]  S Name/Age/Gender Dennis Serrano 85 y.o. male Room/Bed: ED08A/ED08A  Code Status   Code Status: Prior  Home/SNF/Other Skilled nursing facility Patient oriented to: self Is this baseline? No   Triage Complete: Triage complete  Chief Complaint hypotemsive  Triage Note Pt had dec LOC per facility ( [facility]) as pt could have POSSIBLE taken another pt medications.  Pt was hypotensive for EMS. Pt baseline ambulatory,  250 CC given 18g IV LAC pt is A/Ox4 BG 104 76/84 then 125/64 97% RA    Allergies No Known Allergies  Level of Care/Admitting Diagnosis ED Disposition    ED Disposition Condition [facility]: [facility] [[id number]]  Level of Care: Telemetry [5]  Diagnosis: Medication overdose [030092]  Admitting Physician: Saundra Shelling [[id number]]  Attending Physician: Saundra Shelling [[id number]]  PT Class (Do Not Modify): Observation [104]  PT Acc Code (Do Not Modify): Observation [10022]       B Medical/Surgery History Past Medical History:  Diagnosis Date  . Anxiety   . CAD (coronary artery disease)   . Dementia ([place])   . Diabetes mellitus without complication ([place])   . GERD (gastroesophageal reflux disease)   . History of heart artery stent   . HLD (hyperlipidemia)   . HTN (hypertension)   . Parkinson disease ([place])   . Prostate cancer ([place])    over 5 years ago   Past Surgical History:  Procedure Laterality Date  . APPENDECTOMY    . CORONARY STENT PLACEMENT    . EYE SURGERY    . TRANSURETHRAL RESECTION OF PROSTATE       A IV Location/Drains/Wounds Patient Lines/Drains/Airways Status   Active Line/Drains/Airways    Name:   Placement date:   Placement time:   Site:   Days:   Peripheral IV 09/02/18 Left Antecubital   09/02/18    1354    Antecubital   less than 1   Pressure Injury 10/06/16 Stage II -  Partial thickness loss of dermis presenting as  a shallow open ulcer with a red, pink wound bed without slough. round jagged edges   10/06/16    2100     696   Pressure Injury 10/06/16 Stage II -  Partial thickness loss of dermis presenting as a shallow open ulcer with a red, pink wound bed without slough. round dark pink jagged edges   10/06/16    2100     696          Intake/Output Last 24 hours  Intake/Output Summary (Last 24 hours) at 09/02/2018 1631 Last data filed at 09/02/2018 1601 Gross per 24 hour  Intake 500 ml  Output -  Net 500 ml    Labs/Imaging Results for orders placed or performed during the hospital encounter of 09/02/18 (from the past 48 hour(s))  Basic metabolic panel     Status: Abnormal   Collection Time: 09/02/18 12:28 PM  Result Value Ref Range   Sodium 130 (L) 135 - 145 mmol/L   Potassium 3.8 3.5 - 5.1 mmol/L   Chloride 98 98 - 111 mmol/L   CO2 23 22 - 32 mmol/L   Glucose, Bld 104 (H) 70 - 99 mg/dL   BUN 19 8 - 23 mg/dL   Creatinine, Ser 1.07 0.61 - 1.24 mg/dL   Calcium 9.0 8.9 - 10.3 mg/dL   GFR calc non Af Amer >  60 >60 mL/min   GFR calc Af Amer >60 >60 mL/min   Anion gap 9 5 - 15    Comment: Performed at [facility], [address] [place], [place] [address]  CBC with Differential     Status: Abnormal   Collection Time: 09/02/18 12:28 PM  Result Value Ref Range   WBC 5.9 4.0 - 10.5 K/uL   RBC 3.44 (L) 4.22 - 5.81 MIL/uL   Hemoglobin 10.0 (L) 13.0 - 17.0 g/dL   HCT 29.6 (L) 39.0 - 52.0 %   MCV 86.0 80.0 - 100.0 fL   MCH 29.1 26.0 - 34.0 pg   MCHC 33.8 30.0 - 36.0 g/dL   RDW 13.2 11.5 - 15.5 %   Platelets 331 150 - 400 K/uL   nRBC 0.0 0.0 - 0.2 %   Neutrophils Relative % 66 %   Neutro Abs 3.9 1.7 - 7.7 K/uL   Lymphocytes Relative 22 %   Lymphs Abs 1.3 0.7 - 4.0 K/uL   Monocytes Relative 9 %   Monocytes Absolute 0.5 0.1 - 1.0 K/uL   Eosinophils Relative 3 %   Eosinophils Absolute 0.2 0.0 - 0.5 K/uL   Basophils Relative 0 %   Basophils Absolute 0.0 0.0 - 0.1 K/uL   Immature  Granulocytes 0 %   Abs Immature Granulocytes 0.02 0.00 - 0.07 K/uL    Comment: Performed at [facility], [address] [place], [place] [address]  Troponin I - Once     Status: None   Collection Time: 09/02/18 12:28 PM  Result Value Ref Range   Troponin I <0.03 <0.03 ng/mL    Comment: Performed at [facility], [address] [place], [place] [address]   No results found.  Pending Labs Unresulted Labs (From admission, onward)    Start     Ordered   09/02/18 1301  Urinalysis, Complete w Microscopic  ONCE - STAT,   STAT     09/02/18 1300   Signed and Held  CBC  (enoxaparin (LOVENOX)    CrCl >/= 30 ml/min)  Once,   R    Comments:  Baseline for enoxaparin therapy IF NOT ALREADY DRAWN.  Notify MD if PLT < 100 K.    Signed and Held   Signed and Held  Creatinine, serum  (enoxaparin (LOVENOX)    CrCl >/= 30 ml/min)  Once,   R    Comments:  Baseline for enoxaparin therapy IF NOT ALREADY DRAWN.    Signed and Held   Signed and Held  Creatinine, serum  (enoxaparin (LOVENOX)    CrCl >/= 30 ml/min)  Weekly,   R    Comments:  while on enoxaparin therapy    Signed and Held   Signed and Held  Basic metabolic panel  Tomorrow morning,   R     Signed and Held   Signed and Held  CBC  Tomorrow morning,   R     Signed and Held          Vitals/Pain Today's Vitals   09/02/18 1415 09/02/18 1430 09/02/18 1445 09/02/18 1604  BP:  136/75  135/77  Pulse:    (!) 108  Resp: 14 15 15 17   Temp:      TempSrc:      SpO2:    97%  Weight:        Isolation Precautions No active isolations  Medications Medications  sodium chloride 0.9 % bolus 500 mL (0 mLs Intravenous Stopped 09/02/18  1601)    Mobility walks with device Moderate fall risk   Focused Assessments    R Recommendations: See Admitting Provider Note  Report given to:  Kat RN  Additional Notes:

## 2018-09-02 NOTE — H&P (Signed)
[facility] [place] NAME: Dennis Serrano    MR#:  [id number]  DATE OF BIRTH:  [birth year]  DATE OF ADMISSION:  09/02/2018  PRIMARY CARE PHYSICIAN: [facility]   REQUESTING/REFERRING PHYSICIAN:   CHIEF COMPLAINT:   Chief Complaint  Patient presents with  . Drug Overdose    HISTORY OF PRESENT ILLNESS: Dennis Serrano  is a 85 y.o. male with a known history of coronary artery disease, dementia, type 2 diabetes mellitus, GERD, hyperlipidemia, hypertension, Parkinson's disease, anxiety disorder, prostate cancer he is a resident of [place] at [facility].  Patient was given his medications today.  Apart from his own medications patient was also given medications of another patient which includes amitzia, amlodipine, atorvastatin, benztropine, carbamazepine, Plavix, Aricept, Lasix,gavilax powder, Levemir insulin, levothyroxine, losartan, mirtazapine, Protonix, quetiapine, Aldactone, temazepam, vitamin D3 supplements and tramadol.  Patient was lethargic and sleepy.  His blood pressure was low when EMS picked him up.  He was given 1 round of IV fluid bolus by EMS and blood pressure improved.  He is still lethargic and sleepy in the emergency room.  EKG shows normal sinus with left anterior fascicle block with no new changes.  Daughter was available at bedside.  No history of fall or head injury.  Hospital service was consulted.  PAST MEDICAL HISTORY:   Past Medical History:  Diagnosis Date  . Anxiety   . CAD (coronary artery disease)   . Dementia (Dennis Serrano)   . Diabetes mellitus without complication (Dennis Serrano)   . GERD (gastroesophageal reflux disease)   . History of heart artery stent   . HLD (hyperlipidemia)   . HTN (hypertension)   . Parkinson disease (Dennis Serrano)   . Prostate cancer (Dennis Serrano)    over 5 years ago    PAST SURGICAL HISTORY:  Past Surgical History:  Procedure Laterality Date  . APPENDECTOMY    . CORONARY STENT PLACEMENT    . EYE  SURGERY    . TRANSURETHRAL RESECTION OF PROSTATE      SOCIAL HISTORY:  Social History   Tobacco Use  . Smoking status: Former Smoker    Types: Cigarettes    Last attempt to quit: 1980    Years since quitting: 40.2  . Smokeless tobacco: Never Used  Substance Use Topics  . Alcohol use: No    FAMILY HISTORY:  Family History  Problem Relation Age of Onset  . Heart disease Mother   . Heart disease Father   . Cancer Sister   . Cancer Brother     DRUG ALLERGIES: No Known Allergies  REVIEW OF SYSTEMS:  Could not be obtained completely as patient is lethargic and sleepy  MEDICATIONS AT HOME:  Prior to Admission medications   Medication Sig Start Date End Date Taking? Authorizing Provider  aspirin 325 MG tablet Take 1 tablet (325 mg total) by mouth daily. Patient taking differently: Take 81 mg by mouth daily.  10/08/16  Yes [name], MD  carbidopa-levodopa (SINEMET IR) 25-100 MG tablet Take 1 tablet by mouth 3 (three) times daily.   Yes [provider]  cholecalciferol (VITAMIN D) 25 MCG (1000 UT) tablet Take 2,000 Units by mouth daily.   Yes [provider]  cloNIDine (CATAPRES) 0.1 MG tablet Take 0.1 mg by mouth 2 (two) times daily. 08/14/18  Yes [provider]  diclofenac sodium (VOLTAREN) 1 % GEL Apply 2 g topically 4 (four) times daily.   Yes [provider]  diltiazem (CARDIZEM  LA) 420 MG 24 hr tablet Take 420 mg by mouth daily. 04/23/18  Yes [provider]  docusate sodium (COLACE) 100 MG capsule Take 100 mg by mouth 2 (two) times daily.   Yes [provider]  furosemide (LASIX) 20 MG tablet Take 20 mg by mouth daily. 08/20/18  Yes [provider]  gemfibrozil (LOPID) 600 MG tablet Take 600 mg by mouth 2 (two) times daily. 08/20/18  Yes [provider]  hydrALAZINE (APRESOLINE) 100 MG tablet Take 100 mg by mouth 4 (four) times daily. 08/20/18  Yes [provider]  insulin detemir  (LEVEMIR) 100 UNIT/ML injection Inject 0.12 mLs (12 Units total) into the skin at bedtime. Patient taking differently: Inject 39 Units into the skin every morning.  10/07/16  Yes [name], MD  lisinopril (PRINIVIL,ZESTRIL) 40 MG tablet Take 40 mg by mouth daily.   Yes [provider]  Magnesium Gluconate 500 (27 Mg) MG TABS Take 1,000 mg by mouth 3 (three) times daily.   Yes [provider]  magnesium hydroxide (MILK OF MAGNESIA) 400 MG/5ML suspension Take 30 mLs by mouth daily as needed for mild constipation.   Yes [provider]  potassium chloride (K-DUR,KLOR-CON) 10 MEQ tablet Take 1 tablet (10 mEq total) by mouth 2 (two) times daily. 08/19/17  Yes [name], MD  acetaminophen (TYLENOL) 500 MG tablet Take 500 mg by mouth every 8 (eight) hours.    [provider]  albuterol (PROVENTIL HFA;VENTOLIN HFA) 108 (90 Base) MCG/ACT inhaler Inhale 2 puffs into the lungs every 6 (six) hours as needed for wheezing or shortness of breath. 10/07/16   [name], MD  ondansetron (ZOFRAN ODT) 4 MG disintegrating tablet Take 1 tablet (4 mg total) by mouth every 8 (eight) hours as needed for nausea or vomiting. Patient not taking: Reported on 09/02/2018 08/19/17   [name], MD  QUEtiapine (SEROQUEL) 25 MG tablet Take 1 tablet (25 mg total) by mouth at bedtime. Patient not taking: Reported on 08/18/2017 10/07/16   [name], MD  traMADol (ULTRAM) 50 MG tablet Take 1 tablet (50 mg total) by mouth every 6 (six) hours as needed. Patient not taking: Reported on 09/02/2018 06/13/17   [name], MD      PHYSICAL EXAMINATION:   VITAL SIGNS: Blood pressure 125/64, pulse 86, temperature 98.7 F (37.1 C), temperature source Oral, weight 68 kg, SpO2 97 %.  GENERAL:  85 y.o.-year-old patient lying in the bed with no acute distress.  EYES: Pupils equal, round, reactive to light and accommodation. No scleral icterus. Extraocular muscles  intact.  HEENT: Head atraumatic, normocephalic. Oropharynx and nasopharynx clear.  NECK:  Supple, no jugular venous distention. No thyroid enlargement, no tenderness.  LUNGS: Normal breath sounds bilaterally, no wheezing, rales,rhonchi or crepitation. No use of accessory muscles of respiration.  CARDIOVASCULAR: S1, S2 normal. No murmurs, rubs, or gallops.  ABDOMEN: Soft, nontender, nondistended. Bowel sounds present. No organomegaly or mass.  EXTREMITIES: No pedal edema, cyanosis, or clubbing.  NEUROLOGIC: Arousable to loud verbal commands Moves all extremities PSYCHIATRIC: The patient is alert and oriented x 1 SKIN: No obvious rash, lesion, or ulcer.   LABORATORY PANEL:   CBC Recent Labs  Lab 09/02/18 1228  WBC 5.9  HGB 10.0*  HCT 29.6*  PLT 331  MCV 86.0  MCH 29.1  MCHC 33.8  RDW 13.2  LYMPHSABS 1.3  MONOABS 0.5  EOSABS 0.2  BASOSABS 0.0   ------------------------------------------------------------------------------------------------------------------  Chemistries  Recent Labs  Lab 09/02/18  1228  NA 130*  K 3.8  CL 98  CO2 23  GLUCOSE 104*  BUN 19  CREATININE 1.07  CALCIUM 9.0   ------------------------------------------------------------------------------------------------------------------ CrCl cannot be calculated (Unknown ideal weight.). ------------------------------------------------------------------------------------------------------------------ No results for input(s): TSH, T4TOTAL, T3FREE, THYROIDAB in the last 72 hours.  Invalid input(s): FREET3   Coagulation profile No results for input(s): INR, PROTIME in the last 168 hours. ------------------------------------------------------------------------------------------------------------------- No results for input(s): DDIMER in the last 72 hours. -------------------------------------------------------------------------------------------------------------------  Cardiac Enzymes Recent Labs  Lab  09/02/18 1228  TROPONINI <0.03   ------------------------------------------------------------------------------------------------------------------ Invalid input(s): POCBNP  ---------------------------------------------------------------------------------------------------------------  Urinalysis    Component Value Date/Time   COLORURINE STRAW (A) 08/24/2017 2322   APPEARANCEUR CLEAR (A) 08/24/2017 2322   LABSPEC 1.008 08/24/2017 2322   PHURINE 7.0 08/24/2017 2322   GLUCOSEU NEGATIVE 08/24/2017 2322   HGBUR NEGATIVE 08/24/2017 2322   BILIRUBINUR NEGATIVE 08/24/2017 2322   KETONESUR NEGATIVE 08/24/2017 2322   PROTEINUR 100 (A) 08/24/2017 2322   NITRITE NEGATIVE 08/24/2017 2322   LEUKOCYTESUR NEGATIVE 08/24/2017 2322     RADIOLOGY: No results found.  EKG: Orders placed or performed during the hospital encounter of 09/02/18  . EKG 12-Lead  . EKG 12-Lead  . EKG 12-Lead  . EKG 12-Lead    IMPRESSION AND PLAN: 85 year old male patient with a known history of coronary artery disease, dementia, type 2 diabetes mellitus, GERD, hyperlipidemia, hypertension, Parkinson's disease, anxiety disorder, prostate cancer he is a resident of [place] at [facility].   Apart from his own medications patient was also given medications of another patient which includes amitzia, amlodipine, atorvastatin, benztropine, carbamazepine, Plavix, Aricept, Lasix,gavilax powder, Levemir insulin, levothyroxine, losartan, mirtazapine, Protonix, quetiapine, Aldactone, temazepam, vitamin D3 supplements and tramadol.  -Drug overdose Multiple medications given We will monitor patient on telemetry overnight observation bed Check for any arrhythmias Twelve-lead EKG shows normal sinus rhythm with left anterior fascicular block No ST segment changes noted  -Hyponatremia Secondary to dehydration Gentle IV fluid hydration Follow-up sodium levels  -Hypotension Secondary to multiple hypertensive meds Hold blood  pressure meds for now Allow blood pressure to revive back  -Anemia Appears chronic Outpatient work-up  -Parkinson's disease Resume levodopa carbidopa  -DVT prophylaxis subcu Lovenox daily  -Type 2 diabetes mellitus Diabetic diet with sliding scale coverage with insulin  All the records are reviewed and case discussed with ED provider. Management plans discussed with the patient, family and they are in agreement.  CODE STATUS:Full code Code Status History    Date Active Date Inactive Code Status Order ID Comments User Context   10/20/2016 2336 10/21/2016 2132 Full Code [id number]  Nicholes Mango, MD Inpatient   10/02/2016 2324 10/08/2016 1631 Full Code [id number]  Lance Coon, MD Inpatient     TOTAL TIME TAKING CARE OF THIS PATIENT: 52 minutes.    Saundra Shelling M.D on 09/02/2018 at 2:23 PM  Between 7am to 6pm - Pager - [phone]  After 6pm go to [email or web address] - password [email or web address] [facility]  [facility] [facility]  Office  [phone]  CC: Primary care physician; [facility]

## 2018-09-02 NOTE — Progress Notes (Signed)
Advanced care plan. Purpose of the Encounter: CODE STATUS Parties in Attendance: Patient and family Patient's Decision Capacity: Not great Subjective/Patient's story: Dennis Serrano  is a 85 y.o. male with a known history of coronary artery disease, dementia, type 2 diabetes mellitus, GERD, hyperlipidemia, hypertension, Parkinson's disease, anxiety disorder, prostate cancer he is a resident of [place] at [facility].  Patient was given his medications today.  Apart from his own medications patient was also given medications of another patient which includes amitzia, amlodipine, atorvastatin, benztropine, carbamazepine, Plavix, Aricept, Lasix,gavilax powder, Levemir insulin, levothyroxine, losartan, mirtazapine, Protonix, quetiapine, Aldactone, temazepam, vitamin D3 supplements and tramadol Objective/Medical story When EMS picked up the patient he was hypotensive.  Patient was given IV fluid bolus.  Patient also appeared lethargic and sleepy.  He needs cardiac monitoring and overnight observation to check for any cardiac arrhythmias. Goals of care determination:  His care directives goals of care and treatment plan discussed with patient's daughter in detail.  She wants everything done for now.  Patient is full resuscitation CODE STATUS: Full code Time spent discussing advanced care planning: 16 minutes

## 2018-09-02 NOTE — ED Notes (Addendum)
Family at bedside. (daughter)  Pt incontinent of urine( per family this is new since overdose) , bedding and diaper changed

## 2018-09-02 NOTE — ED Notes (Addendum)
This RN spoke with Dennis Serrano to ensure she had contacted family. She confirmed.   Family contact: Donald Pore [phone] (grand daughter)

## 2018-09-02 NOTE — ED Notes (Addendum)
This RN called [facility] and spoke to [place] the med tech there to clarify what medication pt took. [place] states, "pt became lethargic and not listening to her at lunch, then it dawned on her that pt has taken another pt medication as well as their own she is 99% sure." This RN inquired if she saw pt take both meds and she could not remember. She states, " I am 99% sure he took both, they look the same in the cup.I have never done this before."

## 2018-09-02 NOTE — ED Notes (Signed)
Called report to floor. Spoke to [place] who informed this nurse that the floor is full and they cant accept this patient until another nurse arrives.

## 2018-09-02 NOTE — ED Triage Notes (Signed)
Pt had dec LOC per facility ( [facility]) as pt could have POSSIBLE taken another pt medications.  Pt was hypotensive for EMS. Pt baseline ambulatory,  250 CC given 18g IV LAC pt is A/Ox4 BG 104 76/84 then 125/64 97% RA

## 2018-09-02 NOTE — ED Provider Notes (Signed)
[facility] Emergency Department Provider Note ____________________________________________   First MD Initiated Contact with Patient 09/02/18 1239     (approximate)  I have reviewed the triage vital signs and the nursing notes.   HISTORY  Chief Complaint Drug Overdose  Level 5 caveat: History of present illness limited due to dementia  HPI Dennis Serrano is a 85 y.o. male who presents with hypotension and altered mental status after apparently being given medications that were supposed to be given to another patient.  The patient is unable to give any history.  EMS noted that the patient was hypotensive to the 53G systolic but his blood pressure normalized after a small fluid bolus.  Patient states that he feels lightheaded but denies any other acute complaints.  Past Medical History:  Diagnosis Date  . Anxiety   . CAD (coronary artery disease)   . Dementia ([place])   . Diabetes mellitus without complication ([place])   . GERD (gastroesophageal reflux disease)   . History of heart artery stent   . HLD (hyperlipidemia)   . HTN (hypertension)   . Parkinson disease ([place])   . Prostate cancer ([place])    over 5 years ago    Patient Active Problem List   Diagnosis Date Noted  . TIA (transient ischemic attack) 10/20/2016  . Pressure injury of skin 10/07/2016  . Sepsis ([place]) 10/02/2016  . CAP (community acquired pneumonia) 10/02/2016  . HTN (hypertension) 10/02/2016  . HLD (hyperlipidemia) 10/02/2016  . CAD (coronary artery disease) 10/02/2016  . GERD (gastroesophageal reflux disease) 10/02/2016    Past Surgical History:  Procedure Laterality Date  . APPENDECTOMY    . CORONARY STENT PLACEMENT    . EYE SURGERY    . TRANSURETHRAL RESECTION OF PROSTATE      Prior to Admission medications   Medication Sig Start Date End Date Taking? Authorizing Provider  aspirin 325 MG tablet Take 1 tablet (325 mg total) by mouth daily. Patient taking differently: Take 81  mg by mouth daily.  10/08/16  Yes [name], MD  carbidopa-levodopa (SINEMET IR) 25-100 MG tablet Take 1 tablet by mouth 3 (three) times daily.   Yes [provider]  cholecalciferol (VITAMIN D) 25 MCG (1000 UT) tablet Take 2,000 Units by mouth daily.   Yes [provider]  diclofenac sodium (VOLTAREN) 1 % GEL Apply 2 g topically 4 (four) times daily.   Yes [provider]  diltiazem (CARDIZEM LA) 420 MG 24 hr tablet Take 420 mg by mouth daily. 04/23/18  Yes [provider]  docusate sodium (COLACE) 100 MG capsule Take 100 mg by mouth 2 (two) times daily.   Yes [provider]  furosemide (LASIX) 20 MG tablet Take 20 mg by mouth daily. 08/20/18  Yes [provider]  gemfibrozil (LOPID) 600 MG tablet Take 600 mg by mouth 2 (two) times daily. 08/20/18  Yes [provider]  hydrALAZINE (APRESOLINE) 100 MG tablet Take 100 mg by mouth 4 (four) times daily. 08/20/18  Yes [provider]  insulin detemir (LEVEMIR) 100 UNIT/ML injection Inject 0.12 mLs (12 Units total) into the skin at bedtime. Patient taking differently: Inject 39 Units into the skin every morning.  10/07/16  Yes [name], MD  lisinopril (PRINIVIL,ZESTRIL) 40 MG tablet Take 40 mg by mouth daily.   Yes [provider]  Magnesium Gluconate 500 (27 Mg) MG TABS Take 1,000 mg by mouth 3 (three) times daily.   Yes [provider]  potassium chloride (  K-DUR,KLOR-CON) 10 MEQ tablet Take 1 tablet (10 mEq total) by mouth 2 (two) times daily. 08/19/17  Yes [name], MD  acetaminophen (TYLENOL) 500 MG tablet Take 500 mg by mouth every 8 (eight) hours.    [provider]  albuterol (PROVENTIL HFA;VENTOLIN HFA) 108 (90 Base) MCG/ACT inhaler Inhale 2 puffs into the lungs every 6 (six) hours as needed for wheezing or shortness of breath. 10/07/16   [name], MD  cloNIDine (CATAPRES) 0.1 MG tablet Take 0.1 mg by mouth 2  (two) times daily. 08/14/18   [provider]  ondansetron (ZOFRAN ODT) 4 MG disintegrating tablet Take 1 tablet (4 mg total) by mouth every 8 (eight) hours as needed for nausea or vomiting. 08/19/17   [name], MD  QUEtiapine (SEROQUEL) 25 MG tablet Take 1 tablet (25 mg total) by mouth at bedtime. Patient not taking: Reported on 08/18/2017 10/07/16   [name], MD  traMADol (ULTRAM) 50 MG tablet Take 1 tablet (50 mg total) by mouth every 6 (six) hours as needed. 06/13/17   [name], MD    Allergies Patient has no known allergies.  Family History  Problem Relation Age of Onset  . Heart disease Mother   . Heart disease Father   . Cancer Sister   . Cancer Brother     Social History Social History   Tobacco Use  . Smoking status: Former Smoker    Types: Cigarettes    Last attempt to quit: 1980    Years since quitting: 40.2  . Smokeless tobacco: Never Used  Substance Use Topics  . Alcohol use: No  . Drug use: No    Review of Systems Level 5 caveat: Review of systems limited due to dementia   ____________________________________________   PHYSICAL EXAM:  VITAL SIGNS: ED Triage Vitals  Enc Vitals Group     BP 09/02/18 1225 125/64     Pulse Rate 09/02/18 1225 86     Resp --      Temp 09/02/18 1225 98.7 F (37.1 C)     Temp Source 09/02/18 1225 Oral     SpO2 09/02/18 1225 97 %     Weight 09/02/18 1226 150 lb (68 kg)     Height --      Head Circumference --      Peak Flow --      Pain Score --      Pain Loc --      Pain Edu? --      Excl. in [place]? --     Constitutional: Alert, slightly confused.  Frail appearing but in no acute distress. Eyes: Conjunctivae are normal.  EOMI.  PERRLA. Head: Atraumatic. Nose: No congestion/rhinnorhea. Mouth/Throat: Mucous membranes are dry.   Neck: Normal range of motion.  Cardiovascular: Normal rate, regular rhythm. Grossly normal heart sounds.  Good peripheral circulation. Respiratory: Normal  respiratory effort.  No retractions. Lungs CTAB. Gastrointestinal:  No distention.  Genitourinary: No CVA tenderness. Musculoskeletal:  Extremities warm and well perfused.  Neurologic: Motor intact in all extremities. Skin:  Skin is warm and dry. No rash noted. Psychiatric: Unable to assess.  ____________________________________________   LABS (all labs ordered are listed, but only abnormal results are displayed)  Labs Reviewed  BASIC METABOLIC PANEL - Abnormal; Notable for the following components:      Result Value   Sodium 130 (*)    Glucose, Bld 104 (*)    All other components within normal limits  CBC WITH DIFFERENTIAL/PLATELET -  Abnormal; Notable for the following components:   RBC 3.44 (*)    Hemoglobin 10.0 (*)    HCT 29.6 (*)    All other components within normal limits  TROPONIN I  URINALYSIS, COMPLETE (UACMP) WITH MICROSCOPIC   ____________________________________________  EKG  ED ECG REPORT I, Arta Silence, the attending physician, personally viewed and interpreted this ECG.  Date: 09/02/2018 EKG Time: 1230 Rate: 93 Rhythm: normal sinus rhythm QRS Axis: normal Intervals: LAFB ST/T Wave abnormalities: normal Narrative Interpretation: no evidence of acute ischemia  ____________________________________________  RADIOLOGY    ____________________________________________   PROCEDURES  Procedure(s) performed: No  Procedures  Critical Care performed: No ____________________________________________   INITIAL IMPRESSION / ASSESSMENT AND PLAN / ED COURSE  Pertinent labs & imaging results that were available during my care of the patient were reviewed by me and considered in my medical decision making (see chart for details).  85 year old male with PMH as noted above presents with altered mental status and resolved hypotension after apparently being given both his normal morning medications, and the medications of another patient at his  facility.  The patient reports feeling lightheaded and weak but denies other acute complaints.  We were provided with the patient's [facility] as well as the [facility] for the patient who is medications he apparently received.  The incorrect medications include amlodipine 10 mg, Cogentin, carbamazepine, Plavix, donepezil, furosemide, losartan, spironolactone, and Seroquel.  This is in addition to the patient's own Sinemet, diltiazem, furosemide, hydralazine, and lisinopril.  The patient was hypotensive in the field but his blood pressure normalized by the time of his arrival in the ED.  His other vital signs are normal.  He is alert but weak appearing and appears mildly confused.  Overall the patient's presentation is consistent with accidental administration of these medications, particularly blood pressure and seizure medications.  We will obtain labs to rule out other acute causes and give a fluid bolus.  However, given that many of these medications are once daily, I think it will be prudent to admit the patient for observation overnight as he will be at risk for recurrent hypotension.  ----------------------------------------- 2:13 PM on 09/02/2018 -----------------------------------------  The lab work-up is unremarkable.  We will proceed with admission.  I signed the patient out to the hospitalist Dr. Estanislado Pandy. ____________________________________________   FINAL CLINICAL IMPRESSION(S) / ED DIAGNOSES  Final diagnoses:  Medication side effect      NEW MEDICATIONS STARTED DURING THIS VISIT:  New Prescriptions   No medications on file     Note:  This document was prepared using Dragon voice recognition software and may include unintentional dictation errors.    Arta Silence, MD 09/02/18 [phone]

## 2018-09-03 DIAGNOSIS — F0280 Dementia in other diseases classified elsewhere without behavioral disturbance: Secondary | ICD-10-CM | POA: Diagnosis present

## 2018-09-03 DIAGNOSIS — T465X1A Poisoning by other antihypertensive drugs, accidental (unintentional), initial encounter: Secondary | ICD-10-CM | POA: Diagnosis present

## 2018-09-03 DIAGNOSIS — T466X1A Poisoning by antihyperlipidemic and antiarteriosclerotic drugs, accidental (unintentional), initial encounter: Secondary | ICD-10-CM | POA: Diagnosis present

## 2018-09-03 DIAGNOSIS — T381X1A Poisoning by thyroid hormones and substitutes, accidental (unintentional), initial encounter: Secondary | ICD-10-CM | POA: Diagnosis present

## 2018-09-03 DIAGNOSIS — T45521A Poisoning by antithrombotic drugs, accidental (unintentional), initial encounter: Secondary | ICD-10-CM | POA: Diagnosis present

## 2018-09-03 DIAGNOSIS — E785 Hyperlipidemia, unspecified: Secondary | ICD-10-CM | POA: Diagnosis present

## 2018-09-03 DIAGNOSIS — T50901A Poisoning by unspecified drugs, medicaments and biological substances, accidental (unintentional), initial encounter: Secondary | ICD-10-CM | POA: Diagnosis present

## 2018-09-03 DIAGNOSIS — I10 Essential (primary) hypertension: Secondary | ICD-10-CM | POA: Diagnosis present

## 2018-09-03 DIAGNOSIS — T404X1A Poisoning by other synthetic narcotics, accidental (unintentional), initial encounter: Secondary | ICD-10-CM | POA: Diagnosis present

## 2018-09-03 DIAGNOSIS — T472X1A Poisoning by stimulant laxatives, accidental (unintentional), initial encounter: Secondary | ICD-10-CM | POA: Diagnosis present

## 2018-09-03 DIAGNOSIS — T501X1A Poisoning by loop [high-ceiling] diuretics, accidental (unintentional), initial encounter: Secondary | ICD-10-CM | POA: Diagnosis present

## 2018-09-03 DIAGNOSIS — E11649 Type 2 diabetes mellitus with hypoglycemia without coma: Secondary | ICD-10-CM | POA: Diagnosis present

## 2018-09-03 DIAGNOSIS — T441X1A Poisoning by other parasympathomimetics [cholinergics], accidental (unintentional), initial encounter: Secondary | ICD-10-CM | POA: Diagnosis present

## 2018-09-03 DIAGNOSIS — E860 Dehydration: Secondary | ICD-10-CM | POA: Diagnosis present

## 2018-09-03 DIAGNOSIS — T383X1A Poisoning by insulin and oral hypoglycemic [antidiabetic] drugs, accidental (unintentional), initial encounter: Secondary | ICD-10-CM | POA: Diagnosis present

## 2018-09-03 DIAGNOSIS — G20 Parkinson's disease: Secondary | ICD-10-CM | POA: Diagnosis present

## 2018-09-03 DIAGNOSIS — T452X1A Poisoning by vitamins, accidental (unintentional), initial encounter: Secondary | ICD-10-CM | POA: Diagnosis present

## 2018-09-03 DIAGNOSIS — K219 Gastro-esophageal reflux disease without esophagitis: Secondary | ICD-10-CM | POA: Diagnosis present

## 2018-09-03 DIAGNOSIS — T428X1A Poisoning by antiparkinsonism drugs and other central muscle-tone depressants, accidental (unintentional), initial encounter: Secondary | ICD-10-CM | POA: Diagnosis present

## 2018-09-03 DIAGNOSIS — I959 Hypotension, unspecified: Secondary | ICD-10-CM | POA: Diagnosis present

## 2018-09-03 DIAGNOSIS — E871 Hypo-osmolality and hyponatremia: Secondary | ICD-10-CM | POA: Diagnosis present

## 2018-09-03 DIAGNOSIS — T471X1A Poisoning by other antacids and anti-gastric-secretion drugs, accidental (unintentional), initial encounter: Secondary | ICD-10-CM | POA: Diagnosis present

## 2018-09-03 DIAGNOSIS — I2510 Atherosclerotic heart disease of native coronary artery without angina pectoris: Secondary | ICD-10-CM | POA: Diagnosis present

## 2018-09-03 DIAGNOSIS — T461X1A Poisoning by calcium-channel blockers, accidental (unintentional), initial encounter: Secondary | ICD-10-CM | POA: Diagnosis present

## 2018-09-03 DIAGNOSIS — T424X1A Poisoning by benzodiazepines, accidental (unintentional), initial encounter: Secondary | ICD-10-CM | POA: Diagnosis present

## 2018-09-03 DIAGNOSIS — Y92099 Unspecified place in other non-institutional residence as the place of occurrence of the external cause: Secondary | ICD-10-CM | POA: Diagnosis not present

## 2018-09-03 LAB — GLUCOSE, CAPILLARY
GLUCOSE-CAPILLARY: 214 mg/dL — AB (ref 70–99)
Glucose-Capillary: 153 mg/dL — ABNORMAL HIGH (ref 70–99)
Glucose-Capillary: 19 mg/dL — CL (ref 70–99)
Glucose-Capillary: 45 mg/dL — ABNORMAL LOW (ref 70–99)
Glucose-Capillary: 107 mg/dL — ABNORMAL HIGH (ref 70–99)
Glucose-Capillary: 206 mg/dL — ABNORMAL HIGH (ref 70–99)

## 2018-09-03 LAB — CBC
HEMATOCRIT: 32 % — AB (ref 39.0–52.0)
HEMOGLOBIN: 10.7 g/dL — AB (ref 13.0–17.0)
MCH: 29.2 pg (ref 26.0–34.0)
MCHC: 33.4 g/dL (ref 30.0–36.0)
MCV: 87.4 fL (ref 80.0–100.0)
Platelets: 392 10*3/uL (ref 150–400)
RBC: 3.66 MIL/uL — ABNORMAL LOW (ref 4.22–5.81)
RDW: 13.2 % (ref 11.5–15.5)
WBC: 8 10*3/uL (ref 4.0–10.5)
nRBC: 0 % (ref 0.0–0.2)

## 2018-09-03 LAB — BASIC METABOLIC PANEL
Anion gap: 3 — ABNORMAL LOW (ref 5–15)
BUN: 19 mg/dL (ref 8–23)
CO2: 26 mmol/L (ref 22–32)
Calcium: 8.6 mg/dL — ABNORMAL LOW (ref 8.9–10.3)
Chloride: 106 mmol/L (ref 98–111)
Creatinine, Ser: 0.89 mg/dL (ref 0.61–1.24)
GFR calc Af Amer: >60 mL/min (ref >60)
GFR calc non Af Amer: >60 mL/min (ref >60)
Glucose, Bld: 37 mg/dL — CL (ref 70–99)
POTASSIUM: 3.2 mmol/L — AB (ref 3.5–5.1)
Sodium: 135 mmol/L (ref 135–145)

## 2018-09-03 LAB — MRSA PCR SCREENING: MRSA BY PCR: NEGATIVE

## 2018-09-03 MED ORDER — DILTIAZEM HCL ER COATED BEADS 180 MG PO CP24
420.0000 mg | ORAL_CAPSULE | Freq: Every day | ORAL | Status: DC
  Administered 2018-09-03 – 2018-09-04 (×2): 420 mg via ORAL
  Filled 2018-09-03: qty 1
  Filled 2018-09-03: qty 2
  Filled 2018-09-03: qty 1

## 2018-09-03 MED ORDER — DILTIAZEM HCL ER COATED BEADS 420 MG PO TB24: 420.0000 mg | ORAL_TABLET | Freq: Every day | ORAL | Status: DC

## 2018-09-03 MED ORDER — DEXTROSE 50 % IV SOLN
25.0000 g | INTRAVENOUS | Status: AC
  Administered 2018-09-03: 50 mL via INTRAVENOUS

## 2018-09-03 MED ORDER — DEXTROSE 50 % IV SOLN
INTRAVENOUS | Status: AC
  Administered 2018-09-03: 50 mL via INTRAVENOUS
  Filled 2018-09-03: qty 50

## 2018-09-03 MED ORDER — POTASSIUM CHLORIDE CRYS ER 20 MEQ PO TBCR
40.0000 meq | EXTENDED_RELEASE_TABLET | Freq: Once | ORAL | Status: AC
  Administered 2018-09-03: 40 meq via ORAL
  Filled 2018-09-03: qty 2

## 2018-09-03 MED ORDER — CLONIDINE HCL 0.1 MG PO TABS
0.1000 mg | ORAL_TABLET | Freq: Two times a day (BID) | ORAL | Status: DC
  Administered 2018-09-03 – 2018-09-06 (×7): 0.1 mg via ORAL
  Filled 2018-09-03 (×7): qty 1

## 2018-09-03 MED ORDER — HYDRALAZINE HCL 20 MG/ML IJ SOLN: 10.0000 mg | Freq: Four times a day (QID) | INTRAMUSCULAR | Status: DC | PRN

## 2018-09-03 MED ORDER — DEXTROSE-NACL 5-0.45 % IV SOLN
INTRAVENOUS | Status: DC
  Administered 2018-09-03 – 2018-09-04 (×3): via INTRAVENOUS

## 2018-09-03 MED ORDER — HYDRALAZINE HCL 50 MG PO TABS
100.0000 mg | ORAL_TABLET | Freq: Four times a day (QID) | ORAL | Status: DC
  Administered 2018-09-03 – 2018-09-06 (×14): 100 mg via ORAL
  Filled 2018-09-03 (×13): qty 2

## 2018-09-03 NOTE — Clinical Social Work Note (Addendum)
CSW met with patient and his daughter, they would like to go to a different ALF.  CSW explained to them the process, and limited options for availability.  CSW provided a list of different ALFs in [place], [place] will look at other ALFs to see if anyone can accept patient.  Patient's family were made aware that if no other ALFs offer a bed, he will have to either go back to [facility] or go home with home health.  Patient's daughter expressed understanding.  CSW will begin bed search for different ALFs in [facility] and updated patient's daughter.  Formal assessment to follow at a later time.  Jones Broom. Sherby Moncayo, MSW, [place]  09/03/2018 5:15 PM

## 2018-09-03 NOTE — Progress Notes (Addendum)
Lab reports glucose of 37. CBG check shows blood sugar of 19. Pt mildly sweating but alert and oriented and asymptomatic. Per protocol, one amp of D 50 given. MD Marcille Blanco made aware.  CBG recheck of 153. Pt given 8 oz of orange juice per MD but he did not want to drink it and has fallen asleep

## 2018-09-03 NOTE — Progress Notes (Signed)
[place] at [facility]                                                                                                                                                                                  Patient Demographics   Dennis Serrano, is a 85 y.o. male, DOB - Feb 21, 1933, ENI:[id number]  Admit date - 09/02/2018   Admitting Physician Saundra Shelling, MD  Outpatient Primary MD for the patient is [facility]   LOS - 0  Subjective:  Patient denies any complaints currently noted to be hypoglycemic this morning   Review of Systems:   CONSTITUTIONAL: No documented fever. No fatigue, weakness. No weight gain, no weight loss.  EYES: No blurry or double vision.  ENT: No tinnitus. No postnasal drip. No redness of the oropharynx.  RESPIRATORY: No cough, no wheeze, no hemoptysis. No dyspnea.  CARDIOVASCULAR: No chest pain. No orthopnea. No palpitations. No syncope.  GASTROINTESTINAL: No nausea, no vomiting or diarrhea. No abdominal pain. No melena or hematochezia.  GENITOURINARY: No dysuria or hematuria.  ENDOCRINE: No polyuria or nocturia. No heat or cold intolerance.  HEMATOLOGY: No anemia. No bruising. No bleeding.  INTEGUMENTARY: No rashes. No lesions.  MUSCULOSKELETAL: No arthritis. No swelling. No gout.  NEUROLOGIC: No numbness, tingling, or ataxia. No seizure-type activity.  PSYCHIATRIC: No anxiety. No insomnia. No ADD.    Vitals:   Vitals:   09/03/18 0412 09/03/18 0757 09/03/18 1320 09/03/18 1325  BP: (!) 160/65 (!) 181/88 (!) 160/70   Pulse: 85 84    Resp: 18     Temp: (!) 97.4 F (36.3 C) 98.3 F (36.8 C)    TempSrc: Oral Oral    SpO2: 98% 95%  98%  Weight: 71.8 kg     Height:        Wt Readings from Last 3 Encounters:  09/03/18 71.8 kg  08/24/17 68 kg  08/19/17 63.5 kg     Intake/Output Summary (Last 24 hours) at 09/03/2018 1526 Last data filed at 09/03/2018 1410 Gross per 24 hour  Intake 740 ml  Output 825 ml  Net -85  ml    Physical Exam:   GENERAL: Pleasant-appearing in no apparent distress.  HEAD, EYES, EARS, NOSE AND THROAT: Atraumatic, normocephalic. Extraocular muscles are intact. Pupils equal and reactive to light. Sclerae anicteric. No conjunctival injection. No oro-pharyngeal erythema.  NECK: Supple. There is no jugular venous distention. No bruits, no lymphadenopathy, no thyromegaly.  HEART: Regular rate and rhythm,. No murmurs, no rubs, no clicks.  LUNGS: Clear to auscultation bilaterally. No rales or rhonchi. No wheezes.  ABDOMEN: Soft, flat, nontender, nondistended. Has good bowel sounds. No hepatosplenomegaly appreciated.  EXTREMITIES:  No evidence of any cyanosis, clubbing, or peripheral edema.  +2 pedal and radial pulses bilaterally.  NEUROLOGIC: The patient is alert, awake, and oriented x3 with no focal motor or sensory deficits appreciated bilaterally.  SKIN: Moist and warm with no rashes appreciated.  Psych: Not anxious, depressed LN: No inguinal LN enlargement    Antibiotics   Anti-infectives (From admission, onward)   None      Medications   Scheduled Meds: . carbidopa-levodopa  1 tablet Oral TID  . cholecalciferol  2,000 Units Oral Daily  . cloNIDine  0.1 mg Oral BID  . diltiazem  420 mg Oral Daily  . enoxaparin (LOVENOX) injection  40 mg Subcutaneous Q24H  . hydrALAZINE  100 mg Oral QID   Continuous Infusions: . dextrose 5 % and 0.45% NaCl 75 mL/hr at 09/03/18 0928   PRN Meds:.acetaminophen **OR** acetaminophen, albuterol, hydrALAZINE, ondansetron **OR** ondansetron (ZOFRAN) IV, senna-docusate   Data Review:   Micro Results No results found for this or any previous visit (from the past 240 hour(s)).  Radiology Reports No results found.   CBC Recent Labs  Lab 09/02/18 1228 09/03/18 0303  WBC 5.9 8.0  HGB 10.0* 10.7*  HCT 29.6* 32.0*  PLT 331 392  MCV 86.0 87.4  MCH 29.1 29.2  MCHC 33.8 33.4  RDW 13.2 13.2  LYMPHSABS 1.3  --   MONOABS 0.5  --    EOSABS 0.2  --   BASOSABS 0.0  --     Chemistries  Recent Labs  Lab 09/02/18 1228 09/03/18 0303  NA 130* 135  K 3.8 3.2*  CL 98 106  CO2 23 26  GLUCOSE 104* 37*  BUN 19 19  CREATININE 1.07 0.89  CALCIUM 9.0 8.6*   ------------------------------------------------------------------------------------------------------------------ estimated creatinine clearance is 62.9 mL/min (by C-G formula based on SCr of 0.89 mg/dL). ------------------------------------------------------------------------------------------------------------------ No results for input(s): HGBA1C in the last 72 hours. ------------------------------------------------------------------------------------------------------------------ No results for input(s): CHOL, HDL, LDLCALC, TRIG, CHOLHDL, LDLDIRECT in the last 72 hours. ------------------------------------------------------------------------------------------------------------------ No results for input(s): TSH, T4TOTAL, T3FREE, THYROIDAB in the last 72 hours.  Invalid input(s): FREET3 ------------------------------------------------------------------------------------------------------------------ No results for input(s): VITAMINB12, FOLATE, FERRITIN, TIBC, IRON, RETICCTPCT in the last 72 hours.  Coagulation profile No results for input(s): INR, PROTIME in the last 168 hours.  No results for input(s): DDIMER in the last 72 hours.  Cardiac Enzymes Recent Labs  Lab 09/02/18 1228  TROPONINI <0.03   ------------------------------------------------------------------------------------------------------------------ Invalid input(s): POCBNP    Assessment & Plan   IMPRESSION AND PLAN: 86 year old male patient with a known history of coronary artery disease, dementia, type 2 diabetes mellitus, GERD, hyperlipidemia, hypertension, Parkinson's disease, anxiety disorder, prostate cancer he is a resident of [place] at [facility].   Apart from his own medications  patient was also given medications of another patient which includes amitzia, amlodipine, atorvastatin, benztropine, carbamazepine, Plavix, Aricept, Lasix,gavilax powder, Levemir insulin, levothyroxine, losartan, mirtazapine, Protonix, quetiapine, Aldactone, temazepam, vitamin D3 supplements and tramadol.  -Hypoglycemia with history of diabetes type 2: This is related to excessive medication he accidentally received I will place patient on D5 containing fluids monitor blood sugar Hold any diabetic medications    -Drug overdose Multiple medications given Continue to monitor in the hospital   -Hyponatremia Secondary to dehydration Gentle IV fluid hydration Follow-up sodium levels  -Hypotension on admission now hypotensive Resume his home medications  -Anemia Appears chronic Outpatient work-up  -Parkinson's disease Continue levodopa carbidopa  -DVT prophylaxis subcu Lovenox daily        Code  Status Orders  (From admission, onward)         Start     Ordered   09/02/18 2214  Full code  Continuous     09/02/18 2213        Code Status History    Date Active Date Inactive Code Status Order ID Comments User Context   10/20/2016 2336 10/21/2016 2132 Full Code [id number]  Nicholes Mango, MD Inpatient   10/02/2016 2324 10/08/2016 1631 Full Code [id number]  Lance Coon, MD Inpatient           Consults none  DVT Prophylaxis  Lovenox  Lab Results  Component Value Date   PLT 392 09/03/2018     Time Spent in minutes 35 minutes  Greater than 50% of time spent in care coordination and counseling patient regarding the condition and plan of care.   Dustin Flock M.D on 09/03/2018 at 3:26 PM  Between 7am to 6pm - Pager - [phone]  After 6pm go to [email or web address] - Proofreader  [employer]   Office  [phone]

## 2018-09-03 NOTE — Plan of Care (Signed)
  Problem: Health Behavior/Discharge Planning: Goal: Ability to manage health-related needs will improve Outcome: Progressing Note:  B.S. has gone from 19 today to 107, and B.P. has gone from > 993 to 716 systolic. Will continue to monitor hemodynamic parameters. Dennis Serrano [facility]

## 2018-09-03 NOTE — Progress Notes (Signed)
Per MD, CBG ordered for [facility]. No reports from [place] concerning any  arrhythmia.

## 2018-09-03 NOTE — Progress Notes (Signed)
PT Cancellation Note  Patient Details Name: Dennis Serrano MRN: [id number] DOB: 1933-03-03   Cancelled Treatment:    Reason Eval/Treat Not Completed: Patient not medically ready. Patient has BP of 181/88 and BS of 37. Will hold for this morning, and re-attempt later if appropriate.    Bao Bazen 09/03/2018, 10:12 AM

## 2018-09-03 NOTE — Evaluation (Signed)
Physical Therapy Evaluation Patient Details Name: Dennis Serrano MRN: [id number] DOB: 1932/08/03 Today's Date: 09/03/2018   History of Present Illness  Patient is an 85 year old male admitted from [facility] assisted living for having received another patient's medications in addition to his own medications.   PMH to include: CAD, DM, dementia, gerd, HLD, HTN, parkinson's, anxiety  Clinical Impression  Patient received in bed, daughter present. Patient agrees to get up to recliner. No complaints. Patient performed bed mobility with modified independence and supervision. Transferred from bed to recliner (pivot transfer) with min guard. Patient did not display significant weakness or difficulty with basic mobility, unsure as to why he has not been ambulatory. Daughter reports he is able to self propel using LEs in wheelchair at facility. Patient appears to be at baseline level of function. He may benefit from HHPT when returned to assisted living to attempt ambulation.         Follow Up Recommendations Home health PT    Equipment Recommendations  None recommended by PT(can bed done at next venue)    Recommendations for Other Services       Precautions / Restrictions Precautions Precautions: Fall Restrictions Weight Bearing Restrictions: No      Mobility  Bed Mobility Overal bed mobility: Modified Independent                Transfers Overall transfer level: Modified independent Equipment used: None             General transfer comment: patient able to pivot to recliner from bed with min guard assist.   Ambulation/Gait             General Gait Details: patient is non ambulatory at baseline- has not walked in about a year per daughter  Science writer    Modified Rankin (Stroke Patients Only)       Balance Overall balance assessment: Modified Independent                                           Pertinent  Vitals/Pain Pain Assessment: No/denies pain    Home Living Family/patient expects to be discharged to:: Assisted living               Home Equipment: Wheelchair - manual      Prior Function Level of Independence: Independent with assistive device(s)         Comments: Patient's daughter reports he does not ambulate, he can propel himself in wheelchair.      Hand Dominance        Extremity/Trunk Assessment   Upper Extremity Assessment Upper Extremity Assessment: Overall WFL for tasks assessed    Lower Extremity Assessment Lower Extremity Assessment: Overall WFL for tasks assessed       Communication   Communication: No difficulties;HOH  Cognition Arousal/Alertness: Awake/alert Behavior During Therapy: WFL for tasks assessed/performed Overall Cognitive Status: History of cognitive impairments - at baseline                                        General Comments      Exercises     Assessment/Plan    PT Assessment All further PT needs can be met in the next  venue of care;Patent does not need any further PT services  PT Problem List Decreased mobility;Decreased safety awareness;Decreased strength;Decreased cognition       PT Treatment Interventions      PT Goals (Current goals can be found in the Care Plan section)  Acute Rehab PT Goals Patient Stated Goal: to return to assisted living, but not return to the [facility] PT Goal Formulation: With family Time For Goal Achievement: 09/10/18 Potential to Achieve Goals: Good    Frequency     Barriers to discharge        Co-evaluation               AM-PAC PT "6 Clicks" Mobility  Outcome Measure Help needed turning from your back to your side while in a flat bed without using bedrails?: None Help needed moving from lying on your back to sitting on the side of a flat bed without using bedrails?: A Little Help needed moving to and from a bed to a chair (including a wheelchair)?: A  Little Help needed standing up from a chair using your arms (e.g., wheelchair or bedside chair)?: A Little Help needed to walk in hospital room?: A Lot Help needed climbing 3-5 steps with a railing? : A Lot 6 Click Score: 17    End of Session Equipment Utilized During Treatment: Gait belt Activity Tolerance: Patient tolerated treatment well Patient left: in chair;with family/visitor present Nurse Communication: Mobility status PT Visit Diagnosis: Difficulty in walking, not elsewhere classified (R26.2);Muscle weakness (generalized) (M62.81)    Time: 1443-1540 PT Time Calculation (min) (ACUTE ONLY): 16 min   Charges:   PT Evaluation $PT Eval Moderate Complexity: 1 Mod          Teneisha Gignac, PT, GCS 09/03/18,2:18 PM

## 2018-09-04 LAB — GLUCOSE, CAPILLARY
GLUCOSE-CAPILLARY: 209 mg/dL — AB (ref 70–99)
Glucose-Capillary: 167 mg/dL — ABNORMAL HIGH (ref 70–99)
Glucose-Capillary: 142 mg/dL — ABNORMAL HIGH (ref 70–99)
Glucose-Capillary: 182 mg/dL — ABNORMAL HIGH (ref 70–99)
Glucose-Capillary: 177 mg/dL — ABNORMAL HIGH (ref 70–99)

## 2018-09-04 LAB — BASIC METABOLIC PANEL
Anion gap: 7 (ref 5–15)
BUN: 22 mg/dL (ref 8–23)
CO2: 23 mmol/L (ref 22–32)
Calcium: 8.2 mg/dL — ABNORMAL LOW (ref 8.9–10.3)
Chloride: 102 mmol/L (ref 98–111)
Creatinine, Ser: 0.75 mg/dL (ref 0.61–1.24)
GFR calc Af Amer: >60 mL/min (ref >60)
GFR calc non Af Amer: >60 mL/min (ref >60)
Glucose, Bld: 168 mg/dL — ABNORMAL HIGH (ref 70–99)
Potassium: 3.7 mmol/L (ref 3.5–5.1)
Sodium: 132 mmol/L — ABNORMAL LOW (ref 135–145)

## 2018-09-04 MED ORDER — INSULIN DETEMIR 100 UNIT/ML ~~LOC~~ SOLN
12.0000 [IU] | Freq: Every day | SUBCUTANEOUS | Status: DC
  Administered 2018-09-04 – 2018-09-05 (×2): 12 [IU] via SUBCUTANEOUS
  Filled 2018-09-04 (×3): qty 0.12

## 2018-09-04 MED ORDER — GEMFIBROZIL 600 MG PO TABS
600.0000 mg | ORAL_TABLET | Freq: Two times a day (BID) | ORAL | Status: DC
  Administered 2018-09-04 – 2018-09-06 (×4): 600 mg via ORAL
  Filled 2018-09-04 (×5): qty 1

## 2018-09-04 MED ORDER — INSULIN ASPART 100 UNIT/ML ~~LOC~~ SOLN
0.0000 [IU] | Freq: Three times a day (TID) | SUBCUTANEOUS | Status: DC
  Administered 2018-09-04: 2 [IU] via SUBCUTANEOUS
  Administered 2018-09-04: 3 [IU] via SUBCUTANEOUS
  Administered 2018-09-05: 2 [IU] via SUBCUTANEOUS
  Administered 2018-09-05: 1 [IU] via SUBCUTANEOUS
  Administered 2018-09-06: 5 [IU] via SUBCUTANEOUS
  Filled 2018-09-04 (×5): qty 1

## 2018-09-04 MED ORDER — SENNOSIDES-DOCUSATE SODIUM 8.6-50 MG PO TABS
1.0000 | ORAL_TABLET | Freq: Two times a day (BID) | ORAL | Status: DC
  Administered 2018-09-04 – 2018-09-06 (×4): 1 via ORAL
  Filled 2018-09-04 (×5): qty 1

## 2018-09-04 NOTE — Progress Notes (Signed)
Inpatient Diabetes Program Recommendations  AACE/ADA: New Consensus Statement on Inpatient Glycemic Control   Target Ranges:  Prepandial:   less than 140 mg/dL      Peak postprandial:   less than 180 mg/dL (1-2 hours)      Critically ill patients:  140 - 180 mg/dL   Results for EATHAN, GROMAN (MRN [id number]) as of 09/04/2018 10:41  Ref. Range 09/03/2018 04:01 09/03/2018 04:20 09/03/2018 08:28 09/03/2018 11:08 09/03/2018 17:12 09/03/2018 20:59 09/04/2018 03:24 09/04/2018 06:04  Glucose-Capillary Latest Ref Range: 70 - 99 mg/dL 19 (LL) 153 (H) 45 (L) 107 (H) 206 (H) 214 (H) 167 (H) 142 (H)   Review of Glycemic Control  Diabetes history: DM2 Outpatient Diabetes medications: Levemir 39 units QAM Current orders for Inpatient glycemic control: NONE  Inpatient Diabetes Program Recommendations:   Correction (SSI): Noted hypoglycemia has resolved. Please consider ordering CBGs [facility] with Novolog 0-9 units TID with meals and Novolog 0-5 units QHS.  IV Fluids: May want to re-evaluate if dextrose is needed in IV fluids.  Thanks, Barnie Alderman, RN, MSN, CDE Diabetes Coordinator Inpatient Diabetes Program [phone] (Team Pager from 8am to 5pm)

## 2018-09-04 NOTE — NC FL2 (Addendum)
[place] LEVEL OF CARE SCREENING TOOL     IDENTIFICATION  Patient Name: Dennis Serrano Birthdate: Jul 18, 1932 Sex: male Admission Date (Current Location): 09/02/2018  [place] and [place] Number:  Engineering geologist and Address:  [facility], [address], [place], [place] [address]      Provider Number: [id number]  Attending Physician Name and Address:  Dustin Flock, MD  Relative Name and Phone Number:   Lucretia Field, [phone]    Current Level of Care: Hospital Recommended Level of Care: [place] Prior Approval Number:    Date Approved/Denied:   PASRR Number:    Discharge Plan: (ALF)    Current Diagnoses: Patient Active Problem List   Diagnosis Date Noted  . Medication overdose 09/02/2018  . TIA (transient ischemic attack) 10/20/2016  . Pressure injury of skin 10/07/2016  . Sepsis ([place]) 10/02/2016  . CAP (community acquired pneumonia) 10/02/2016  . HTN (hypertension) 10/02/2016  . HLD (hyperlipidemia) 10/02/2016  . CAD (coronary artery disease) 10/02/2016  . GERD (gastroesophageal reflux disease) 10/02/2016    Orientation RESPIRATION BLADDER Height & Weight     Self, Time, Place  Normal Continent Weight: 161 lb 3.2 oz (73.1 kg) Height:  5\' 9"  (175.3 cm)  BEHAVIORAL SYMPTOMS/MOOD NEUROLOGICAL BOWEL NUTRITION STATUS      Continent Diet(Carb modified diet)  AMBULATORY STATUS COMMUNICATION OF NEEDS Skin   Supervision Verbally Normal                       Personal Care Assistance Level of Assistance  Bathing, Feeding, Dressing Bathing Assistance: Limited assistance Feeding assistance: Independent Dressing Assistance: Limited assistance     Functional Limitations Info  Sight, Hearing, Speech Sight Info: Adequate Hearing Info: Adequate Speech Info: Adequate    SPECIAL CARE FACTORS FREQUENCY  PT (By licensed PT)     PT Frequency: Home health minimum 2x a week               Contractures Contractures Info: Not present    Additional Factors Info  Code Status, Allergies, Insulin Sliding Scale Code Status Info: Full Code Allergies Info: NKA   Insulin Sliding Scale Info: insulin aspart (novoLOG) injection 0-9 Units 3x a day with meals       Current Medications (09/04/2018):  This is the current hospital active medication list Current Facility-Administered Medications  Medication Dose Route Frequency Provider Last Rate Last Dose  . acetaminophen (TYLENOL) tablet 650 mg  650 mg Oral Q6H PRN Saundra Shelling, MD       Or  . acetaminophen (TYLENOL) suppository 650 mg  650 mg Rectal Q6H PRN Pyreddy, Pavan, MD      . albuterol (PROVENTIL) (2.5 MG/3ML) 0.083% nebulizer solution 3 mL  3 mL Inhalation Q6H PRN Pyreddy, Pavan, MD      . carbidopa-levodopa (SINEMET IR) 25-100 MG per tablet immediate release 1 tablet  1 tablet Oral TID Saundra Shelling, MD   1 tablet at 09/04/18 0932  . cholecalciferol (VITAMIN D) tablet 2,000 Units  2,000 Units Oral Daily Saundra Shelling, MD   2,000 Units at 09/04/18 0932  . cloNIDine (CATAPRES) tablet 0.1 mg  0.1 mg Oral BID Dustin Flock, MD   0.1 mg at 09/04/18 0932  . dextrose 5 %-0.45 % sodium chloride infusion   Intravenous Continuous Dustin Flock, MD 75 mL/hr at 09/04/18 0050    . diltiazem (CARDIZEM CD) 24 hr capsule 420 mg  420 mg Oral Daily Dustin Flock, MD  420 mg at 09/04/18 0932  . enoxaparin (LOVENOX) injection 40 mg  40 mg Subcutaneous Q24H Saundra Shelling, MD   40 mg at 09/03/18 2139  . gemfibrozil (LOPID) tablet 600 mg  600 mg Oral BID AC Dustin Flock, MD      . hydrALAZINE (APRESOLINE) injection 10 mg  10 mg Intravenous Q6H PRN Dustin Flock, MD      . hydrALAZINE (APRESOLINE) tablet 100 mg  100 mg Oral QID Dustin Flock, MD   100 mg at 09/04/18 0933  . insulin aspart (novoLOG) injection 0-9 Units  0-9 Units Subcutaneous TID WC Dustin Flock, MD   3 Units at 09/04/18 1229  . insulin detemir (LEVEMIR)  injection 12 Units  12 Units Subcutaneous QHS Dustin Flock, MD      . ondansetron [facility]) tablet 4 mg  4 mg Oral Q6H PRN Pyreddy, Reatha Harps, MD       Or  . ondansetron (ZOFRAN) injection 4 mg  4 mg Intravenous Q6H PRN Pyreddy, Pavan, MD      . senna-docusate (Senokot-S) tablet 1 tablet  1 tablet Oral QHS PRN Pyreddy, Reatha Harps, MD      . senna-docusate (Senokot-S) tablet 1 tablet  1 tablet Oral BID Dustin Flock, MD         Discharge Medications: Please see discharge summary for a list of discharge medications.  Relevant Imaging Results:  Relevant Lab Results:   Additional Information SSN:  [id number]  Ross Ludwig, LCSW

## 2018-09-04 NOTE — Progress Notes (Signed)
[place] at [facility]                                                                                                                                                                                  Patient Demographics   Dennis Serrano, is a 85 y.o. male, DOB - 29-Oct-1932, YDX:[id number]  Admit date - 09/02/2018   Admitting Physician Saundra Shelling, MD  Outpatient Primary MD for the patient is [facility]   LOS - 1  Subjective:  Patient denies any complaints, blood glucose much better  Review of Systems:   CONSTITUTIONAL: No documented fever. No fatigue, weakness. No weight gain, no weight loss.  EYES: No blurry or double vision.  ENT: No tinnitus. No postnasal drip. No redness of the oropharynx.  RESPIRATORY: No cough, no wheeze, no hemoptysis. No dyspnea.  CARDIOVASCULAR: No chest pain. No orthopnea. No palpitations. No syncope.  GASTROINTESTINAL: No nausea, no vomiting or diarrhea. No abdominal pain. No melena or hematochezia.  GENITOURINARY: No dysuria or hematuria.  ENDOCRINE: No polyuria or nocturia. No heat or cold intolerance.  HEMATOLOGY: No anemia. No bruising. No bleeding.  INTEGUMENTARY: No rashes. No lesions.  MUSCULOSKELETAL: No arthritis. No swelling. No gout.  NEUROLOGIC: No numbness, tingling, or ataxia. No seizure-type activity.  PSYCHIATRIC: No anxiety. No insomnia. No ADD.    Vitals:   Vitals:   09/03/18 1650 09/03/18 1959 09/04/18 0318 09/04/18 0755  BP: (!) 132/52 (!) 161/58 (!) 141/54 (!) 158/66  Pulse: 64 60 (!) 58 (!) 50  Resp:  18 16 18   Temp: 98.4 F (36.9 C) 98.5 F (36.9 C) 98.5 F (36.9 C) 98.7 F (37.1 C)  TempSrc: Oral Oral Oral Oral  SpO2: 98% 98% 98% 98%  Weight:   73.1 kg   Height:        Wt Readings from Last 3 Encounters:  09/04/18 73.1 kg  08/24/17 68 kg  08/19/17 63.5 kg     Intake/Output Summary (Last 24 hours) at 09/04/2018 1401 Last data filed at 09/04/2018 1006 Gross per 24  hour  Intake 720 ml  Output 1300 ml  Net -580 ml    Physical Exam:   GENERAL: Pleasant-appearing in no apparent distress.  HEAD, EYES, EARS, NOSE AND THROAT: Atraumatic, normocephalic. Extraocular muscles are intact. Pupils equal and reactive to light. Sclerae anicteric. No conjunctival injection. No oro-pharyngeal erythema.  NECK: Supple. There is no jugular venous distention. No bruits, no lymphadenopathy, no thyromegaly.  HEART: Regular rate and rhythm,. No murmurs, no rubs, no clicks.  LUNGS: Clear to auscultation bilaterally. No rales or rhonchi. No wheezes.  ABDOMEN: Soft, flat, nontender, nondistended. Has good bowel sounds. No  hepatosplenomegaly appreciated.  EXTREMITIES: No evidence of any cyanosis, clubbing, or peripheral edema.  +2 pedal and radial pulses bilaterally.  NEUROLOGIC: The patient is alert, awake, and oriented x3 with no focal motor or sensory deficits appreciated bilaterally.  SKIN: Moist and warm with no rashes appreciated.  Psych: Not anxious, depressed LN: No inguinal LN enlargement    Antibiotics   Anti-infectives (From admission, onward)   None      Medications   Scheduled Meds: . carbidopa-levodopa  1 tablet Oral TID  . cholecalciferol  2,000 Units Oral Daily  . cloNIDine  0.1 mg Oral BID  . diltiazem  420 mg Oral Daily  . enoxaparin (LOVENOX) injection  40 mg Subcutaneous Q24H  . gemfibrozil  600 mg Oral BID AC  . hydrALAZINE  100 mg Oral QID  . insulin aspart  0-9 Units Subcutaneous TID WC  . insulin detemir  12 Units Subcutaneous QHS  . senna-docusate  1 tablet Oral BID   Continuous Infusions: . dextrose 5 % and 0.45% NaCl 75 mL/hr at 09/04/18 0050   PRN Meds:.acetaminophen **OR** acetaminophen, albuterol, hydrALAZINE, ondansetron **OR** ondansetron (ZOFRAN) IV, senna-docusate   Data Review:   Micro Results Recent Results (from the past 240 hour(s))  MRSA PCR Screening     Status: None   Collection Time: 09/03/18  9:45 PM  Result  Value Ref Range Status   MRSA by PCR NEGATIVE NEGATIVE Final    Comment:        The GeneXpert MRSA Assay (FDA approved for NASAL specimens only), is one component of a comprehensive MRSA colonization surveillance program. It is not intended to diagnose MRSA infection nor to guide or monitor treatment for MRSA infections. Performed at [facility], [address] [place], [place] [address]     Radiology Reports No results found.   CBC Recent Labs  Lab 09/02/18 1228 09/03/18 0303  WBC 5.9 8.0  HGB 10.0* 10.7*  HCT 29.6* 32.0*  PLT 331 392  MCV 86.0 87.4  MCH 29.1 29.2  MCHC 33.8 33.4  RDW 13.2 13.2  LYMPHSABS 1.3  --   MONOABS 0.5  --   EOSABS 0.2  --   BASOSABS 0.0  --     Chemistries  Recent Labs  Lab 09/02/18 1228 09/03/18 0303 09/04/18 0424  NA 130* 135 132*  K 3.8 3.2* 3.7  CL 98 106 102  CO2 23 26 23   GLUCOSE 104* 37* 168*  BUN 19 19 22   CREATININE 1.07 0.89 0.75  CALCIUM 9.0 8.6* 8.2*   ------------------------------------------------------------------------------------------------------------------ estimated creatinine clearance is 70 mL/min (by C-G formula based on SCr of 0.75 mg/dL). ------------------------------------------------------------------------------------------------------------------ No results for input(s): HGBA1C in the last 72 hours. ------------------------------------------------------------------------------------------------------------------ No results for input(s): CHOL, HDL, LDLCALC, TRIG, CHOLHDL, LDLDIRECT in the last 72 hours. ------------------------------------------------------------------------------------------------------------------ No results for input(s): TSH, T4TOTAL, T3FREE, THYROIDAB in the last 72 hours.  Invalid input(s): FREET3 ------------------------------------------------------------------------------------------------------------------ No results for input(s): VITAMINB12, FOLATE, FERRITIN,  TIBC, IRON, RETICCTPCT in the last 72 hours.  Coagulation profile No results for input(s): INR, PROTIME in the last 168 hours.  No results for input(s): DDIMER in the last 72 hours.  Cardiac Enzymes Recent Labs  Lab 09/02/18 1228  TROPONINI <0.03   ------------------------------------------------------------------------------------------------------------------ Invalid input(s): POCBNP    Assessment & Plan   IMPRESSION AND PLAN: 85 year old male patient with a known history of coronary artery disease, dementia, type 2 diabetes mellitus, GERD, hyperlipidemia, hypertension, Parkinson's disease, anxiety disorder, prostate cancer he is a resident of [place]  at [facility].   Apart from his own medications patient was also given medications of another patient which includes amitzia, amlodipine, atorvastatin, benztropine, carbamazepine, Plavix, Aricept, Lasix,gavilax powder, Levemir insulin, levothyroxine, losartan, mirtazapine, Protonix, quetiapine, Aldactone, temazepam, vitamin D3 supplements and tramadol.  -Hypoglycemia with history of diabetes type 2:  Now resolved Restart insulin  -Drug overdose Multiple medications given Continue to monitor in the hospital  -Hyponatremia Secondary to dehydration Follow-up sodium levels  -Hypotension on admission now hypotensive Resume his home medications  -Anemia Appears chronic Outpatient work-up  -Parkinson's disease Continue levodopa carbidopa  -DVT prophylaxis subcu Lovenox daily        Code Status Orders  (From admission, onward)         Start     Ordered   09/02/18 2214  Full code  Continuous     09/02/18 2213        Code Status History    Date Active Date Inactive Code Status Order ID Comments User Context   10/20/2016 2336 10/21/2016 2132 Full Code [id number]  Nicholes Mango, MD Inpatient   10/02/2016 2324 10/08/2016 1631 Full Code [id number]  Lance Coon, MD Inpatient           Consults none  DVT  Prophylaxis  Lovenox  Lab Results  Component Value Date   PLT 392 09/03/2018     Time Spent in minutes 35 minutes  Greater than 50% of time spent in care coordination and counseling patient regarding the condition and plan of care.   Dustin Flock M.D on 09/04/2018 at 2:01 PM  Between 7am to 6pm - Pager - [phone]  After 6pm go to [email or web address] - Proofreader  [employer]   Office  [phone]

## 2018-09-05 LAB — GLUCOSE, CAPILLARY
Glucose-Capillary: 102 mg/dL — ABNORMAL HIGH (ref 70–99)
Glucose-Capillary: 160 mg/dL — ABNORMAL HIGH (ref 70–99)
Glucose-Capillary: 125 mg/dL — ABNORMAL HIGH (ref 70–99)
Glucose-Capillary: 186 mg/dL — ABNORMAL HIGH (ref 70–99)

## 2018-09-05 MED ORDER — DILTIAZEM HCL ER COATED BEADS 180 MG PO CP24
180.0000 mg | ORAL_CAPSULE | Freq: Every day | ORAL | Status: DC
  Administered 2018-09-06: 180 mg via ORAL
  Filled 2018-09-05 (×2): qty 1

## 2018-09-05 MED ORDER — DILTIAZEM HCL ER COATED BEADS 180 MG PO CP24: 180.0000 mg | ORAL_CAPSULE | Freq: Every day | ORAL | 0 refills | Status: DC

## 2018-09-05 NOTE — Discharge Summary (Signed)
[place] at [facility], 85 y.o., DOB 23-Aug-1932, MRN [id number]. Admission date: 09/02/2018 Discharge Date 09/05/2018 Primary MD [facility] Admitting Physician Saundra Shelling, MD  Admission Diagnosis  Medication side effect [T88.7XXA]  Discharge Diagnosis   Active Problems:   Medication overdose accidental Hypoglycemia with history of diabetes type 2 due to accidental overdose Hyponatremia Chronic anemia Parkinson's disease Hyperlipidemia GERD Diabetes type 2    Hospital Course  85 year old male patientwith a known history ofcoronary artery disease, dementia, type 2 diabetes mellitus, GERD, hyperlipidemia, hypertension, Parkinson's disease, anxiety disorder, prostate cancer he is a resident of [place] at [facility]. Apart from his own medications patient was also given medications of another patient which includesamitzia,amlodipine, atorvastatin, benztropine, carbamazepine, Plavix, Aricept, Lasix,gavilaxpowder, Levemir insulin, levothyroxine, losartan, mirtazapine, Protonix, quetiapine, Aldactone, temazepam, vitamin D3 supplements patient was noted to have hypoglycemia and some hypotension which since has resolved.  Patient is doing much better and is stable to return back to assisted living facility.            Consults  None  Significant Tests:  See full reports for all details     No results found.     Today   Subjective:   Facilities manager patient denying any complaints  Objective:   Blood pressure (!) 168/60, pulse (!) 51, temperature 98 F (36.7 C), resp. rate 19, height 5\' 9"  (1.753 m), weight 73.5 kg, SpO2 97 %.  .  Intake/Output Summary (Last 24 hours) at 09/05/2018 1121 Last data filed at 09/05/2018 1024 Gross per 24 hour  Intake 1545 ml  Output 1050 ml  Net 495 ml    Exam VITAL SIGNS: Blood pressure (!) 168/60, pulse (!) 51, temperature 98 F (36.7 C), resp. rate 19, height 5\' 9"   (1.753 m), weight 73.5 kg, SpO2 97 %.  GENERAL:  85 y.o.-year-old patient lying in the bed with no acute distress.  EYES: Pupils equal, round, reactive to light and accommodation. No scleral icterus. Extraocular muscles intact.  HEENT: Head atraumatic, normocephalic. Oropharynx and nasopharynx clear.  NECK:  Supple, no jugular venous distention. No thyroid enlargement, no tenderness.  LUNGS: Normal breath sounds bilaterally, no wheezing, rales,rhonchi or crepitation. No use of accessory muscles of respiration.  CARDIOVASCULAR: S1, S2 normal. No murmurs, rubs, or gallops.  ABDOMEN: Soft, nontender, nondistended. Bowel sounds present. No organomegaly or mass.  EXTREMITIES: No pedal edema, cyanosis, or clubbing.  NEUROLOGIC: Cranial nerves II through XII are intact. Muscle strength 5/5 in all extremities. Sensation intact. Gait not checked.  PSYCHIATRIC: The patient is alert oriented to person not place SKIN: No obvious rash, lesion, or ulcer.   Data Review     CBC w Diff:  Lab Results  Component Value Date   WBC 8.0 09/03/2018   HGB 10.7 (L) 09/03/2018   HCT 32.0 (L) 09/03/2018   PLT 392 09/03/2018   LYMPHOPCT 22 09/02/2018   MONOPCT 9 09/02/2018   EOSPCT 3 09/02/2018   BASOPCT 0 09/02/2018   CMP:  Lab Results  Component Value Date   NA 132 (L) 09/04/2018   K 3.7 09/04/2018   CL 102 09/04/2018   CO2 23 09/04/2018   BUN 22 09/04/2018   CREATININE 0.75 09/04/2018   PROT 6.1 (L) 08/24/2017   ALBUMIN 3.2 (L) 08/24/2017   BILITOT 0.4 08/24/2017   ALKPHOS 77 08/24/2017   AST 35 08/24/2017   ALT 6 (L) 08/24/2017  .  Micro Results Recent Results (from the past 240  hour(s))  MRSA PCR Screening     Status: None   Collection Time: 09/03/18  9:45 PM  Result Value Ref Range Status   MRSA by PCR NEGATIVE NEGATIVE Final    Comment:        The GeneXpert MRSA Assay (FDA approved for NASAL specimens only), is one component of a comprehensive MRSA colonization surveillance  program. It is not intended to diagnose MRSA infection nor to guide or monitor treatment for MRSA infections. Performed at [facility], [address] [place], [place] [address]         Code Status Orders  (From admission, onward)         Start     Ordered   09/02/18 2214  Full code  Continuous     09/02/18 2213        Code Status History    Date Active Date Inactive Code Status Order ID Comments User Context   10/20/2016 2336 10/21/2016 2132 Full Code [id number]  Nicholes Mango, MD Inpatient   10/02/2016 2324 10/08/2016 1631 Full Code [id number]  Lance Coon, MD Inpatient          Follow-up Information    [facility] Follow up in 6 day(s).   Specialty:  Geriatric Medicine Contact information: [place] [id number] [phone]           Discharge Medications   Allergies as of 09/05/2018   No Known Allergies     Medication List    STOP taking these medications   diltiazem 420 MG 24 hr tablet Commonly known as:  CARDIZEM LA Replaced by:  diltiazem 180 MG 24 hr capsule   QUEtiapine 25 MG tablet Commonly known as:  SEROQUEL   traMADol 50 MG tablet Commonly known as:  Ultram     TAKE these medications   acetaminophen 500 MG tablet Commonly known as:  TYLENOL Take 500 mg by mouth every 8 (eight) hours.   albuterol 108 (90 Base) MCG/ACT inhaler Commonly known as:  PROVENTIL HFA;VENTOLIN HFA Inhale 2 puffs into the lungs every 6 (six) hours as needed for wheezing or shortness of breath.   aspirin 325 MG tablet Take 1 tablet (325 mg total) by mouth daily. What changed:  how much to take   carbidopa-levodopa 25-100 MG tablet Commonly known as:  SINEMET IR Take 1 tablet by mouth 3 (three) times daily.   cholecalciferol 25 MCG (1000 UT) tablet Commonly known as:  VITAMIN D Take 2,000 Units by mouth daily.   cloNIDine 0.1 MG tablet Commonly known as:  CATAPRES Take 0.1 mg by mouth 2 (two)  times daily.   diclofenac sodium 1 % Gel Commonly known as:  VOLTAREN Apply 2 g topically 4 (four) times daily.   diltiazem 180 MG 24 hr capsule Commonly known as:  CARDIZEM CD Take 1 capsule (180 mg total) by mouth daily. Start taking on:  September 06, 2018 Replaces:  diltiazem 420 MG 24 hr tablet   docusate sodium 100 MG capsule Commonly known as:  COLACE Take 100 mg by mouth 2 (two) times daily.   furosemide 20 MG tablet Commonly known as:  LASIX Take 20 mg by mouth daily.   gemfibrozil 600 MG tablet Commonly known as:  LOPID Take 600 mg by mouth 2 (two) times daily.   hydrALAZINE 100 MG tablet Commonly known as:  APRESOLINE Take 100 mg by mouth 4 (four) times daily.   insulin detemir 100 UNIT/ML injection Commonly known  as:  LEVEMIR Inject 0.12 mLs (12 Units total) into the skin at bedtime. What changed:    how much to take  when to take this   lisinopril 40 MG tablet Commonly known as:  PRINIVIL,ZESTRIL Take 40 mg by mouth daily.   Magnesium Gluconate 500 (27 Mg) MG Tabs Take 1,000 mg by mouth 3 (three) times daily.   magnesium hydroxide 400 MG/5ML suspension Commonly known as:  MILK OF MAGNESIA Take 30 mLs by mouth daily as needed for mild constipation.   ondansetron 4 MG disintegrating tablet Commonly known as:  Zofran ODT Take 1 tablet (4 mg total) by mouth every 8 (eight) hours as needed for nausea or vomiting.   potassium chloride 10 MEQ tablet Commonly known as:  K-DUR,KLOR-CON Take 1 tablet (10 mEq total) by mouth 2 (two) times daily.          Total Time in preparing paper work, data evaluation and todays exam - 54 minutes  Dustin Flock M.D on 09/05/2018 at 11:21 AM [place]  [phone]

## 2018-09-05 NOTE — Plan of Care (Signed)
  Problem: Health Behavior/Discharge Planning: Goal: Ability to manage health-related needs will improve Outcome: Progressing Note:  Patient to d/c to SNF, hopefully later today. Will continue to monitor. Wenda Low [facility]

## 2018-09-05 NOTE — Clinical Social Work Note (Addendum)
CSW was informed patient needs a different ALF.  CSW has contacted:  [facility], no beds available [facility], no beds available [facility], no beds available [facility], still waiting for call back [facility], awaiting call back [facility], awaiting call back.  CSW informed patient's family that if there are not any beds available, he may have to return back to [facility].  Patient's family do not want to return to [facility], CSW continuing to look for new ALF.  CSW attempted to update patient's daughter had to leave a message awaiting for call back.  Jones Broom. Norval Morton, MSW, [place]  09/05/2018 3:49 PM

## 2018-09-06 LAB — GLUCOSE, CAPILLARY
Glucose-Capillary: 74 mg/dL (ref 70–99)
Glucose-Capillary: 268 mg/dL — ABNORMAL HIGH (ref 70–99)

## 2018-09-06 NOTE — Progress Notes (Signed)
[place] at [facility]                                                                                                                                                                                  Patient Demographics   Dennis Serrano, is a 85 y.o. male, DOB - 25-Nov-1932, YKZ:[id number]  Admit date - 09/02/2018   Admitting Physician Saundra Shelling, MD  Outpatient Primary MD for the patient is [facility]   LOS - 3  Subjective: She was feeling well and was supposed to be discharged however was not able to get another assisted living facility Review of Systems:   CONSTITUTIONAL: No documented fever. No fatigue, weakness. No weight gain, no weight loss.  EYES: No blurry or double vision.  ENT: No tinnitus. No postnasal drip. No redness of the oropharynx.  RESPIRATORY: No cough, no wheeze, no hemoptysis. No dyspnea.  CARDIOVASCULAR: No chest pain. No orthopnea. No palpitations. No syncope.  GASTROINTESTINAL: No nausea, no vomiting or diarrhea. No abdominal pain. No melena or hematochezia.  GENITOURINARY: No dysuria or hematuria.  ENDOCRINE: No polyuria or nocturia. No heat or cold intolerance.  HEMATOLOGY: No anemia. No bruising. No bleeding.  INTEGUMENTARY: No rashes. No lesions.  MUSCULOSKELETAL: No arthritis. No swelling. No gout.  NEUROLOGIC: No numbness, tingling, or ataxia. No seizure-type activity.  PSYCHIATRIC: No anxiety. No insomnia. No ADD.    Vitals:   Vitals:   09/05/18 1700 09/05/18 2017 09/06/18 0418 09/06/18 1024  BP: (!) 162/69 121/87 (!) 160/66 129/71  Pulse: 60 70 69 89  Resp: 20 16 18 19   Temp: 98.2 F (36.8 C) 98.4 F (36.9 C) 98.4 F (36.9 C)   TempSrc: Oral Oral Oral   SpO2: 96% 97% 97% 95%  Weight:      Height:        Wt Readings from Last 3 Encounters:  09/05/18 73.5 kg  08/24/17 68 kg  08/19/17 63.5 kg     Intake/Output Summary (Last 24 hours) at 09/06/2018 1050 Last data filed at 09/06/2018  0330 Gross per 24 hour  Intake -  Output 950 ml  Net -950 ml    Physical Exam:   GENERAL: Pleasant-appearing in no apparent distress.  HEAD, EYES, EARS, NOSE AND THROAT: Atraumatic, normocephalic. Extraocular muscles are intact. Pupils equal and reactive to light. Sclerae anicteric. No conjunctival injection. No oro-pharyngeal erythema.  NECK: Supple. There is no jugular venous distention. No bruits, no lymphadenopathy, no thyromegaly.  HEART: Regular rate and rhythm,. No murmurs, no rubs, no clicks.  LUNGS: Clear to auscultation bilaterally. No rales or rhonchi. No wheezes.  ABDOMEN: Soft, flat, nontender, nondistended. Has good bowel sounds.  No hepatosplenomegaly appreciated.  EXTREMITIES: No evidence of any cyanosis, clubbing, or peripheral edema.  +2 pedal and radial pulses bilaterally.  NEUROLOGIC: The patient is alert, awake, and oriented x3 with no focal motor or sensory deficits appreciated bilaterally.  SKIN: Moist and warm with no rashes appreciated.  Psych: Not anxious, depressed LN: No inguinal LN enlargement    Antibiotics   Anti-infectives (From admission, onward)   None      Medications   Scheduled Meds: . carbidopa-levodopa  1 tablet Oral TID  . cholecalciferol  2,000 Units Oral Daily  . cloNIDine  0.1 mg Oral BID  . diltiazem  180 mg Oral Daily  . enoxaparin (LOVENOX) injection  40 mg Subcutaneous Q24H  . gemfibrozil  600 mg Oral BID AC  . hydrALAZINE  100 mg Oral QID  . insulin aspart  0-9 Units Subcutaneous TID WC  . insulin detemir  12 Units Subcutaneous QHS  . senna-docusate  1 tablet Oral BID   Continuous Infusions:  PRN Meds:.acetaminophen **OR** acetaminophen, albuterol, hydrALAZINE, ondansetron **OR** ondansetron (ZOFRAN) IV, senna-docusate   Data Review:   Micro Results Recent Results (from the past 240 hour(s))  MRSA PCR Screening     Status: None   Collection Time: 09/03/18  9:45 PM  Result Value Ref Range Status   MRSA by PCR  NEGATIVE NEGATIVE Final    Comment:        The GeneXpert MRSA Assay (FDA approved for NASAL specimens only), is one component of a comprehensive MRSA colonization surveillance program. It is not intended to diagnose MRSA infection nor to guide or monitor treatment for MRSA infections. Performed at [facility], [address] [place],  [address]     Radiology Reports No results found.   CBC Recent Labs  Lab 09/02/18 1228 09/03/18 0303  WBC 5.9 8.0  HGB 10.0* 10.7*  HCT 29.6* 32.0*  PLT 331 392  MCV 86.0 87.4  MCH 29.1 29.2  MCHC 33.8 33.4  RDW 13.2 13.2  LYMPHSABS 1.3  --   MONOABS 0.5  --   EOSABS 0.2  --   BASOSABS 0.0  --     Chemistries  Recent Labs  Lab 09/02/18 1228 09/03/18 0303 09/04/18 0424  NA 130* 135 132*  K 3.8 3.2* 3.7  CL 98 106 102  CO2 23 26 23   GLUCOSE 104* 37* 168*  BUN 19 19 22   CREATININE 1.07 0.89 0.75  CALCIUM 9.0 8.6* 8.2*   ------------------------------------------------------------------------------------------------------------------ estimated creatinine clearance is 70 mL/min (by C-G formula based on SCr of 0.75 mg/dL). ------------------------------------------------------------------------------------------------------------------ No results for input(s): HGBA1C in the last 72 hours. ------------------------------------------------------------------------------------------------------------------ No results for input(s): CHOL, HDL, LDLCALC, TRIG, CHOLHDL, LDLDIRECT in the last 72 hours. ------------------------------------------------------------------------------------------------------------------ No results for input(s): TSH, T4TOTAL, T3FREE, THYROIDAB in the last 72 hours.  Invalid input(s): FREET3 ------------------------------------------------------------------------------------------------------------------ No results for input(s): VITAMINB12, FOLATE, FERRITIN, TIBC, IRON, RETICCTPCT in the last 72  hours.  Coagulation profile No results for input(s): INR, PROTIME in the last 168 hours.  No results for input(s): DDIMER in the last 72 hours.  Cardiac Enzymes Recent Labs  Lab 09/02/18 1228  TROPONINI <0.03   ------------------------------------------------------------------------------------------------------------------ Invalid input(s): POCBNP    Assessment & Plan   IMPRESSION AND PLAN: 85 year old male patient with a known history of coronary artery disease, dementia, type 2 diabetes mellitus, GERD, hyperlipidemia, hypertension, Parkinson's disease, anxiety disorder, prostate cancer he is a resident of [place] at [facility].   Apart from his own medications patient was  also given medications of another patient which includes amitzia, amlodipine, atorvastatin, benztropine, carbamazepine, Plavix, Aricept, Lasix,gavilax powder, Levemir insulin, levothyroxine, losartan, mirtazapine, Protonix, quetiapine, Aldactone, temazepam, vitamin D3 supplements and tramadol.  -Hypoglycemia with history of diabetes type 2:  Now resolved Continue insulin  -Drug overdose Multiple medications given Continue to monitor in the hospital  -Hyponatremia Secondary to dehydration Follow-up sodium levels  -Hypotension on admission now hypotensive Blood pressure stable  -Anemia Appears chronic Outpatient work-up  -Parkinson's disease Continue levodopa carbidopa  -DVT prophylaxis subcu Lovenox daily        Code Status Orders  (From admission, onward)         Start     Ordered   09/02/18 2214  Full code  Continuous     09/02/18 2213        Code Status History    Date Active Date Inactive Code Status Order ID Comments User Context   10/20/2016 2336 10/21/2016 2132 Full Code [id number]  Nicholes Mango, MD Inpatient   10/02/2016 2324 10/08/2016 1631 Full Code [id number]  Lance Coon, MD Inpatient           Consults none  DVT Prophylaxis  Lovenox  Lab Results   Component Value Date   PLT 392 09/03/2018     Time Spent in minutes 35 minutes  Greater than 50% of time spent in care coordination and counseling patient regarding the condition and plan of care.   Dustin Flock M.D on 09/06/2018 at 10:50 AM  Between 7am to 6pm - Pager - [phone]  After 6pm go to [email or web address] - Proofreader  [employer]   Office  [phone]

## 2018-09-06 NOTE — Clinical Social Work Note (Signed)
CSW presented bed offers to patient's daughter and she chose [facility].  CSW contacted [facility] and they can accept patient today.    Patient to be d/c'ed today to [facility] room 601.  Patient and family agreeable to plans will transport via ems RN to call report [phone].  Patient's daughter is aware of patient discharging today.  Evette Cristal, MSW, LCSW [phone]

## 2018-09-06 NOTE — NC FL2 (Signed)
[place] LEVEL OF CARE SCREENING TOOL     IDENTIFICATION  Patient Name: Dennis Serrano Birthdate: 1932-09-15 Sex: male Admission Date (Current Location): 09/02/2018  [place] and [place] Number:  Engineering geologist and Address:  [facility], [address] [place], [place] [address]      Provider Number: [id number]  Attending Physician Name and Address:  Dustin Flock, MD  Relative Name and Phone Number:       Current Level of Care: Hospital Recommended Level of Care: [place] Prior Approval Number:    Date Approved/Denied:   PASRR Number: [id number]  Discharge Plan: SNF    Current Diagnoses: Patient Active Problem List   Diagnosis Date Noted  . Medication overdose 09/02/2018  . TIA (transient ischemic attack) 10/20/2016  . Pressure injury of skin 10/07/2016  . Sepsis ([place]) 10/02/2016  . CAP (community acquired pneumonia) 10/02/2016  . HTN (hypertension) 10/02/2016  . HLD (hyperlipidemia) 10/02/2016  . CAD (coronary artery disease) 10/02/2016  . GERD (gastroesophageal reflux disease) 10/02/2016    Orientation RESPIRATION BLADDER Height & Weight     Self, Time, Place  Normal Continent Weight: 162 lb 1.6 oz (73.5 kg) Height:  5\' 9"  (175.3 cm)  BEHAVIORAL SYMPTOMS/MOOD NEUROLOGICAL BOWEL NUTRITION STATUS      Continent Diet(Carb Modified diet)  AMBULATORY STATUS COMMUNICATION OF NEEDS Skin   Limited Assist Verbally Normal                       Personal Care Assistance Level of Assistance  Bathing, Feeding, Dressing Bathing Assistance: Limited assistance Feeding assistance: Limited assistance Dressing Assistance: Limited assistance     Functional Limitations Info  Sight, Hearing, Speech Sight Info: Adequate Hearing Info: Adequate Speech Info: Adequate    SPECIAL CARE FACTORS FREQUENCY  PT (By licensed PT)     PT Frequency: 5x a week              Contractures Contractures Info: Not  present    Additional Factors Info  Code Status, Allergies, Insulin Sliding Scale Code Status Info: Full code Allergies Info: NKA   Insulin Sliding Scale Info: insulin aspart (novoLOG) injection 0-9 Units 3x a day with meals       Current Medications (09/06/2018):  This is the current hospital active medication list Current Facility-Administered Medications  Medication Dose Route Frequency Provider Last Rate Last Dose  . acetaminophen (TYLENOL) tablet 650 mg  650 mg Oral Q6H PRN Saundra Shelling, MD       Or  . acetaminophen (TYLENOL) suppository 650 mg  650 mg Rectal Q6H PRN Pyreddy, Pavan, MD      . albuterol (PROVENTIL) (2.5 MG/3ML) 0.083% nebulizer solution 3 mL  3 mL Inhalation Q6H PRN Pyreddy, Pavan, MD      . carbidopa-levodopa (SINEMET IR) 25-100 MG per tablet immediate release 1 tablet  1 tablet Oral TID Saundra Shelling, MD   1 tablet at 09/06/18 1007  . cholecalciferol (VITAMIN D) tablet 2,000 Units  2,000 Units Oral Daily Saundra Shelling, MD   2,000 Units at 09/06/18 1007  . cloNIDine (CATAPRES) tablet 0.1 mg  0.1 mg Oral BID Dustin Flock, MD   0.1 mg at 09/06/18 1007  . diltiazem (CARDIZEM CD) 24 hr capsule 180 mg  180 mg Oral Daily Dustin Flock, MD   180 mg at 09/06/18 1007  . enoxaparin (LOVENOX) injection 40 mg  40 mg Subcutaneous Q24H Saundra Shelling, MD   40 mg at 09/04/18  2158  . gemfibrozil (LOPID) tablet 600 mg  600 mg Oral BID AC Dustin Flock, MD   600 mg at 09/06/18 0758  . hydrALAZINE (APRESOLINE) injection 10 mg  10 mg Intravenous Q6H PRN Dustin Flock, MD      . hydrALAZINE (APRESOLINE) tablet 100 mg  100 mg Oral QID Dustin Flock, MD   100 mg at 09/06/18 1008  . insulin aspart (novoLOG) injection 0-9 Units  0-9 Units Subcutaneous TID WC Dustin Flock, MD   5 Units at 09/06/18 1218  . insulin detemir (LEVEMIR) injection 12 Units  12 Units Subcutaneous QHS Dustin Flock, MD   12 Units at 09/05/18 2137  . ondansetron (ZOFRAN) tablet 4 mg  4 mg Oral Q6H  PRN Saundra Shelling, MD       Or  . ondansetron (ZOFRAN) injection 4 mg  4 mg Intravenous Q6H PRN Pyreddy, Reatha Harps, MD      . senna-docusate (Senokot-S) tablet 1 tablet  1 tablet Oral QHS PRN Saundra Shelling, MD   1 tablet at 09/04/18 2157  . senna-docusate (Senokot-S) tablet 1 tablet  1 tablet Oral BID Dustin Flock, MD   1 tablet at 09/06/18 1008     Discharge Medications: Please see discharge summary for a list of discharge medications.  Relevant Imaging Results:  Relevant Lab Results:   Additional Information SSN:  [id number]  Ross Ludwig, LCSW

## 2018-09-06 NOTE — Plan of Care (Signed)
  Problem: Health Behavior/Discharge Planning: Goal: Ability to manage health-related needs will improve Outcome: Progressing Note:  Patient to d/c hopefully later today. Awaiting insurance approval / placement. Will continue to monitor / medicate. Wenda Low [facility]

## 2018-09-06 NOTE — Evaluation (Signed)
Physical Therapy Evaluation Patient Details Name: Dennis Serrano MRN: [id number] DOB: 1933/04/02 Today's Date: 09/06/2018   History of Present Illness  Patient is an 85 year old male admitted from [facility] assisted living for having received another patient's medications in addition to his own medications.   PMH to include: CAD, DM, dementia, gerd, HLD, HTN, parkinson's, anxiety  Re-evaluation requested per facility for updated mobility status  Clinical Impression  Upon arrival to room, patient alert and agreeable to session; follows simple commands and makes good effort with all mobility tasks.  Baseline confusion evident; significant difficulty recalling details of social history or prior level of function (obtained from previous documentation).  Demonstrates ability to complete bed mobility with mod indep; bed/chair, chair/toilet transfer via stand/squat pivot transfer, sup/mod indep.  Performs bilat, to/from each surface without difficulty.  Good hand placement and overall technique.  Additionally, able to self-propel manual WC (including obstacle negotiation, turns and changes of direction) with bilat UEs, mod indep. Patient remains at baseline level of functional ability; appropriately functional at [facility] level without physical assist from external helper.  No acute PT needs identified at this time.  Will complete order; please re-consult should needs change.    Follow Up Recommendations No PT follow up    Equipment Recommendations       Recommendations for Other Services       Precautions / Restrictions Precautions Precautions: Fall Restrictions Weight Bearing Restrictions: No      Mobility  Bed Mobility Overal bed mobility: Modified Independent                Transfers Overall transfer level: Modified independent               General transfer comment: stand/squat pivot transfer between surfaces (WC to/from bed/toilet) bilat, sup/mod indep  Ambulation/Gait              General Gait Details: Per chart, non-ambulatory at baseline.  Patient agreeable to [facility] as primary mobility; unable to provide details related to length of time, reason for WC level  Stairs            Wheelchair Mobility    Modified Rankin (Stroke Patients Only)       Balance Overall balance assessment: Needs assistance Sitting-balance support: No upper extremity supported;Feet supported Sitting balance-Leahy Scale: Good     Standing balance support: Bilateral upper extremity supported Standing balance-Leahy Scale: Fair                               Pertinent Vitals/Pain Pain Assessment: No/denies pain    Home Living Family/patient expects to be discharged to:: Assisted living               Home Equipment: Wheelchair - manual      Prior Function Level of Independence: Independent with assistive device(s)         Comments: Per previous notes, Patient's daughter reports he does not ambulate, he can propel himself in wheelchair.   Daughter not present for re-evaluation; patient unable to provide reliable information.     Hand Dominance        Extremity/Trunk Assessment   Upper Extremity Assessment Upper Extremity Assessment: Overall WFL for tasks assessed    Lower Extremity Assessment Lower Extremity Assessment: Overall WFL for tasks assessed(grossly at least 4/5 throughout; appropriate for basic transfers and baseline mobility)       Communication   Communication: No difficulties;HOH  Cognition Arousal/Alertness: Awake/alert Behavior During Therapy: WFL for tasks assessed/performed Overall Cognitive Status: History of cognitive impairments - at baseline                                 General Comments: follows simple commands; poor memory, insight'      General Comments      Exercises Other Exercises Other Exercises: Toilet transfer, stand/squat pivot with grab bar, sup/mod indep; completes personal  hygiene with mod indep Other Exercises: WC mobility x120' with bilat UEs, mod indep-negotiates pathway, obstacles and turns without difficulty; fair/good insight into brake management    Assessment/Plan    PT Assessment Patent does not need any further PT services  PT Problem List Decreased mobility;Decreased safety awareness;Decreased strength;Decreased cognition;Decreased activity tolerance       PT Treatment Interventions      PT Goals (Current goals can be found in the Care Plan section)  Acute Rehab PT Goals PT Goal Formulation: Patient unable to participate in goal setting Time For Goal Achievement: 09/06/18 Potential to Achieve Goals: Good    Frequency     Barriers to discharge        Co-evaluation               AM-PAC PT "6 Clicks" Mobility  Outcome Measure Help needed turning from your back to your side while in a flat bed without using bedrails?: None Help needed moving from lying on your back to sitting on the side of a flat bed without using bedrails?: None Help needed moving to and from a bed to a chair (including a wheelchair)?: None Help needed standing up from a chair using your arms (e.g., wheelchair or bedside chair)?: None Help needed to walk in hospital room?: Total Help needed climbing 3-5 steps with a railing? : Total 6 Click Score: 18    End of Session Equipment Utilized During Treatment: Gait belt Activity Tolerance: Patient tolerated treatment well Patient left: in bed;with bed alarm set;with call bell/phone within reach Nurse Communication: Mobility status PT Visit Diagnosis: Muscle weakness (generalized) (M62.81)    Time: 1437-1500 PT Time Calculation (min) (ACUTE ONLY): 23 min   Charges:   PT Evaluation $PT Re-evaluation: 1 Re-eval PT Treatments $Therapeutic Activity: 8-22 mins       Nasra Counce H. Owens Shark, PT, DPT, NCS 09/06/18, 3:36 PM [phone]

## 2018-09-06 NOTE — Care Management Important Message (Signed)
Important Message  Patient Details  Name: Dennis Serrano MRN: [id number] Date of Birth: 03/30/1933   Medicare Important Message Given:  Yes    Dannette Barbara 09/06/2018, 1:08 PM

## 2018-09-06 NOTE — Discharge Instructions (Signed)
Accidental Drug Poisoning, Adult Accidental drug poisoning happens when a person accidentally takes too much of a substance, such as a prescription medicine, an over-the-counter medicine, a vitamin, a supplement, or an illegal drug. The effects of drug poisoning can be mild, dangerous, or even deadly. What are the causes? This condition is caused by taking too much of a medicine, illegal drug, or other substance. It often results from:  Lack of knowledge about a substance.  Using more than one substance at the same time.  An error made by the health care provider who prescribed the substance.  An error made by the pharmacist who filled the prescription.  A lapse in memory, such as forgetting that you have already taken a dose of the medicine.  Suddenly using a substance after a long period of not using it. The following substances and medicines are more likely to cause an accidental drug poisoning:  Medicines that treat mental problems (psychotropic medicines).  Pain medicines.  Cocaine.  Heroin.  Multivitamins that contain iron.  Over-the-counter cold and cough medicines. What increases the risk? This condition is more likely to occur in:  Elderly adults. Elderly adults are at risk because they may: ? Be taking many different medicines. ? Have difficulty reading labels. ? Forget when they last took their medicine.  People who use illegal drugs.  People who drink alcohol while using illegal drugs or certain medicines.  People with certain mental health conditions. What are the signs or symptoms? Symptoms of this condition depend on the substance and the amount that was taken. Common symptoms include:  Behavior changes, such as confusion.  Sleepiness.  Weakness.  Slowed breathing.  Nausea and vomiting.  Seizures.  Very large or small eye pupil size. A drug poisoning can cause a very serious condition in which your blood pressure drops to a low level (shock).  Symptoms of shock include:  Cold and clammy skin.  Pale skin.  Blue lips.  Very slow breathing.  Extreme sleepiness.  Severe confusion.  Dizziness or fainting. How is this diagnosed? This condition is diagnosed based on:  Your symptoms. You will be asked about the substances you took and when you took them.  A physical exam. You may also have other tests, including:  Urine tests.  Blood tests.  An electrocardiogram (ECG). How is this treated? This condition may need to be treated right away at the hospital. Treatment may involve:  Getting fluids and electrolytes through an IV.  Having a breathing tube inserted in your airway (endotracheal tube) to help you breathe.  Taking medicines. These may include medicines that: ? Absorb any substance that is in your digestive system. ? Block or reverse the effect of the substance that caused the drug poisoning.  Having your blood filtered through an artificial kidney machine (hemodialysis).  Ongoing counseling and mental health support. This may be provided if you used an illegal drug. Follow these instructions at home: Medicines   Take over-the-counter and prescription medicines only as told by your health care provider.  Before taking a new medicine, ask your health care provider whether the medicine: ? May cause side effects. ? Might react with other medicines.  Keep a list of all the medicines that you take, including over-the-counter medicines, vitamins, supplements, and herbs. Bring this list with you to all of your medical visits. General instructions   Drink enough fluid to keep your urine pale yellow.  If you are working with a Social worker or Education officer, community, make  sure to follow his or her instructions. °· Do not drink alcohol if: °? Your health care provider tells you not to drink. °? You are pregnant, may be pregnant, or are planning to become pregnant. °· If you drink alcohol, limit how much you  have: °? 0-1 drink a day for women. °? 0-2 drinks a day for men. °· Be aware of how much alcohol is in your drink. In the [place] one drink equals one typical bottle of beer (12 oz), one-half glass of wine (5 oz), or one shot of hard liquor (1½ oz). °· Keep all follow-up visits as told by your health care provider. This is important. °How is this prevented? ° °· Get help if you are struggling with: °? Alcohol or drug use. °? Depression or another mental health problem. °· Keep the phone number of your local poison control center near your phone or on your cell phone. The hotline of the American Association of Poison Control Centers is (800) 222-1222. °· Store all medicines in safety containers that are out of the reach of children. °· Read the drug inserts that come with your medicines. °· Create a system for taking your medicine, such as a pillbox, that will help you avoid taking too much of the medicine. °· Do not drink alcohol while taking medicines unless your health care provider approves. °· Do not use illegal drugs. °· Do not take medicines that are not prescribed for you. °Contact a health care provider if: °· Your symptoms return. °· You develop new symptoms or side effects after taking a medicine. °· You have questions about possible drug poisoning. Call your local poison control center at (800) 222-1222. °Get help right away if: °· You think that you or someone else may have taken too much of a substance. °· You or someone else is having symptoms of drug poisoning. °Summary °· Accidental drug poisoning happens when a person accidentally takes too much of a substance, such as a prescription medicine, an over-the-counter medicine, a vitamin, a supplement, or an illegal drug. °· The effects of drug poisoning can be mild, dangerous, or even deadly. °· This condition is diagnosed based on your symptoms and a physical exam. You will be asked to tell your health care provider which substances you took and when you  took them. °· This condition may need to be treated right away at the hospital. °This information is not intended to replace advice given to you by your health care provider. Make sure you discuss any questions you have with your health care provider. °Document Released: 08/27/2004 Document Revised: 05/15/2017 Document Reviewed: 05/15/2017 °Elsevier Interactive Patient Education © 2019 [employer] ° °

## 2018-09-06 NOTE — Discharge Summary (Signed)
Dennis Serrano at [facility], 85 y.o., DOB 1932-07-11, MRN [id number]. Admission date: 09/02/2018 Discharge Date 09/06/2018 Primary MD [facility] Admitting Physician Saundra Shelling, MD  Admission Diagnosis  Medication side effect [T88.7XXA]  Discharge Diagnosis   Active Problems:   Medication overdose accidental Hypoglycemia with history of diabetes type 2 due to accidental overdose Hyponatremia Chronic anemia Parkinson's disease Hyperlipidemia GERD Diabetes type 2    Hospital Course  85 year old male patientwith a known history ofcoronary artery disease, dementia, type 2 diabetes mellitus, GERD, hyperlipidemia, hypertension, Parkinson's disease, anxiety disorder, prostate cancer he is a resident of [place] at [facility]. Apart from his own medications patient was also given medications of another patient which includesamitzia,amlodipine, atorvastatin, benztropine, carbamazepine, Plavix, Aricept, Lasix,gavilaxpowder, Levemir insulin, levothyroxine, losartan, mirtazapine, Protonix, quetiapine, Aldactone, temazepam, vitamin D3 supplements patient was noted to have hypoglycemia and some hypotension which since has resolved.  Patient is doing much better and is stable to return back to assisted living facility.            Consults  None  Significant Tests:  See full reports for all details     No results found.     Today   Subjective:   Dennis Serrano patient denying any complaints  Objective:   Blood pressure 129/71, pulse 89, temperature 98.4 F (36.9 C), temperature source Oral, resp. rate 19, height 5\' 9"  (1.753 m), weight 73.5 kg, SpO2 95 %.  .  Intake/Output Summary (Last 24 hours) at 09/06/2018 1054 Last data filed at 09/06/2018 1052 Gross per 24 hour  Intake 240 ml  Output 950 ml  Net -710 ml    Exam VITAL SIGNS: Blood pressure 129/71, pulse 89, temperature 98.4 F (36.9 C), temperature source  Oral, resp. rate 19, height 5\' 9"  (1.753 m), weight 73.5 kg, SpO2 95 %.  GENERAL:  85 y.o.-year-old patient lying in the bed with no acute distress.  EYES: Pupils equal, round, reactive to light and accommodation. No scleral icterus. Extraocular muscles intact.  HEENT: Head atraumatic, normocephalic. Oropharynx and nasopharynx clear.  NECK:  Supple, no jugular venous distention. No thyroid enlargement, no tenderness.  LUNGS: Normal breath sounds bilaterally, no wheezing, rales,rhonchi or crepitation. No use of accessory muscles of respiration.  CARDIOVASCULAR: S1, S2 normal. No murmurs, rubs, or gallops.  ABDOMEN: Soft, nontender, nondistended. Bowel sounds present. No organomegaly or mass.  EXTREMITIES: No pedal edema, cyanosis, or clubbing.  NEUROLOGIC: Cranial nerves II through XII are intact. Muscle strength 5/5 in all extremities. Sensation intact. Gait not checked.  PSYCHIATRIC: The patient is alert oriented to person not place SKIN: No obvious rash, lesion, or ulcer.   Data Review     CBC w Diff:  Lab Results  Component Value Date   WBC 8.0 09/03/2018   HGB 10.7 (L) 09/03/2018   HCT 32.0 (L) 09/03/2018   PLT 392 09/03/2018   LYMPHOPCT 22 09/02/2018   MONOPCT 9 09/02/2018   EOSPCT 3 09/02/2018   BASOPCT 0 09/02/2018   CMP:  Lab Results  Component Value Date   NA 132 (L) 09/04/2018   K 3.7 09/04/2018   CL 102 09/04/2018   CO2 23 09/04/2018   BUN 22 09/04/2018   CREATININE 0.75 09/04/2018   PROT 6.1 (L) 08/24/2017   ALBUMIN 3.2 (L) 08/24/2017   BILITOT 0.4 08/24/2017   ALKPHOS 77 08/24/2017   AST 35 08/24/2017   ALT 6 (L) 08/24/2017  .  Micro Results Recent Results (from the  past 240 hour(s))  MRSA PCR Screening     Status: None   Collection Time: 09/03/18  9:45 PM  Result Value Ref Range Status   MRSA by PCR NEGATIVE NEGATIVE Final    Comment:        The GeneXpert MRSA Assay (FDA approved for NASAL specimens only), is one component of a comprehensive MRSA  colonization surveillance program. It is not intended to diagnose MRSA infection nor to guide or monitor treatment for MRSA infections. Performed at [facility], [address], [place], [place] [address]         Code Status Orders  (From admission, onward)         Start     Ordered   09/02/18 2214  Full code  Continuous     09/02/18 2213        Code Status History    Date Active Date Inactive Code Status Order ID Comments User Context   10/20/2016 2336 10/21/2016 2132 Full Code [id number]  Nicholes Mango, MD Inpatient   10/02/2016 2324 10/08/2016 1631 Full Code [id number]  Lance Coon, MD Inpatient          Follow-up Information    [facility] Follow up in 6 day(s).   Specialty:  Geriatric Medicine Contact information: [place] [address] [phone]           Discharge Medications   Allergies as of 09/06/2018   No Known Allergies     Medication List    STOP taking these medications   diltiazem 420 MG 24 hr tablet Commonly known as:  CARDIZEM LA Replaced by:  diltiazem 180 MG 24 hr capsule   QUEtiapine 25 MG tablet Commonly known as:  SEROQUEL   traMADol 50 MG tablet Commonly known as:  Ultram     TAKE these medications   acetaminophen 500 MG tablet Commonly known as:  TYLENOL Take 500 mg by mouth every 8 (eight) hours.   albuterol 108 (90 Base) MCG/ACT inhaler Commonly known as:  PROVENTIL HFA;VENTOLIN HFA Inhale 2 puffs into the lungs every 6 (six) hours as needed for wheezing or shortness of breath.   aspirin 325 MG tablet Take 1 tablet (325 mg total) by mouth daily. What changed:  how much to take   carbidopa-levodopa 25-100 MG tablet Commonly known as:  SINEMET IR Take 1 tablet by mouth 3 (three) times daily.   cholecalciferol 25 MCG (1000 UT) tablet Commonly known as:  VITAMIN D Take 2,000 Units by mouth daily.   cloNIDine 0.1 MG tablet Commonly known as:  CATAPRES Take  0.1 mg by mouth 2 (two) times daily.   diclofenac sodium 1 % Gel Commonly known as:  VOLTAREN Apply 2 g topically 4 (four) times daily.   diltiazem 180 MG 24 hr capsule Commonly known as:  CARDIZEM CD Take 1 capsule (180 mg total) by mouth daily. Replaces:  diltiazem 420 MG 24 hr tablet   docusate sodium 100 MG capsule Commonly known as:  COLACE Take 100 mg by mouth 2 (two) times daily.   furosemide 20 MG tablet Commonly known as:  LASIX Take 20 mg by mouth daily.   gemfibrozil 600 MG tablet Commonly known as:  LOPID Take 600 mg by mouth 2 (two) times daily.   hydrALAZINE 100 MG tablet Commonly known as:  APRESOLINE Take 100 mg by mouth 4 (four) times daily.   insulin detemir 100 UNIT/ML injection Commonly known as:  LEVEMIR Inject 0.12  mLs (12 Units total) into the skin at bedtime. What changed:    how much to take  when to take this   lisinopril 40 MG tablet Commonly known as:  PRINIVIL,ZESTRIL Take 40 mg by mouth daily.   Magnesium Gluconate 500 (27 Mg) MG Tabs Take 1,000 mg by mouth 3 (three) times daily.   magnesium hydroxide 400 MG/5ML suspension Commonly known as:  MILK OF MAGNESIA Take 30 mLs by mouth daily as needed for mild constipation.   ondansetron 4 MG disintegrating tablet Commonly known as:  Zofran ODT Take 1 tablet (4 mg total) by mouth every 8 (eight) hours as needed for nausea or vomiting.   potassium chloride 10 MEQ tablet Commonly known as:  K-DUR,KLOR-CON Take 1 tablet (10 mEq total) by mouth 2 (two) times daily.          Total Time in preparing paper work, data evaluation and todays exam - 63 minutes  Dustin Flock M.D on 09/06/2018 at 10:54 AM [facility]   Office  [phone]

## 2018-09-06 NOTE — Progress Notes (Signed)
Called and gave report to the receiving facility nurse at this time. All questions answered. Dennis Serrano M Dennis Serrano  Called [facility] and requested non-emergent patient transport to the receiving facility at this time. Awaiting arrival. Dennis Serrano M Dennis Serrano 

## 2018-09-20 IMAGING — US US CAROTID DUPLEX BILAT
1 series · 13 of 24 positions shown · non-contrast
Comparison: None.

CLINICAL DATA: TIA

EXAM:
BILATERAL CAROTID DUPLEX ULTRASOUND
TECHNIQUE: Gray scale imaging, color Doppler and duplex ultrasound were
performed of bilateral carotid and vertebral arteries in the neck.

[Series 1: us carotid duplex bilat · 13 of 59 slices shown]
[im 1/59]
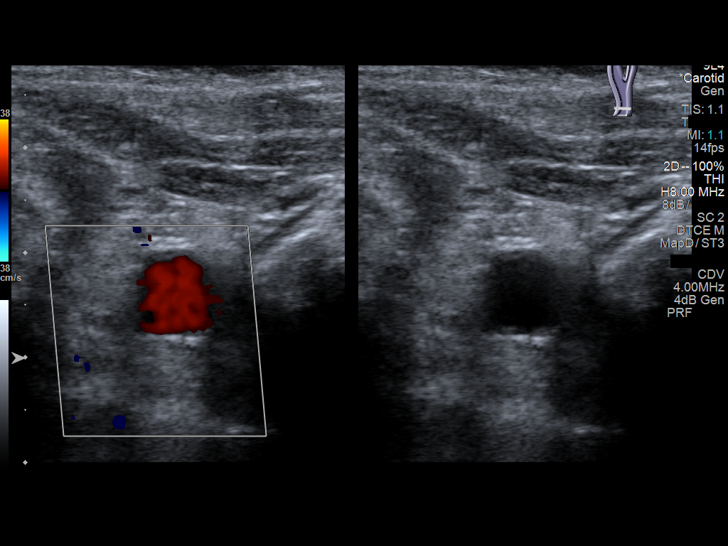
[im 6/59]
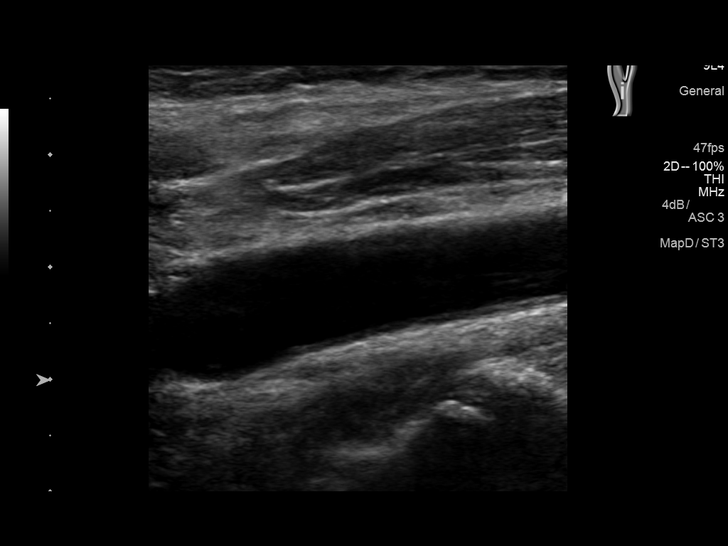
[im 11/59]
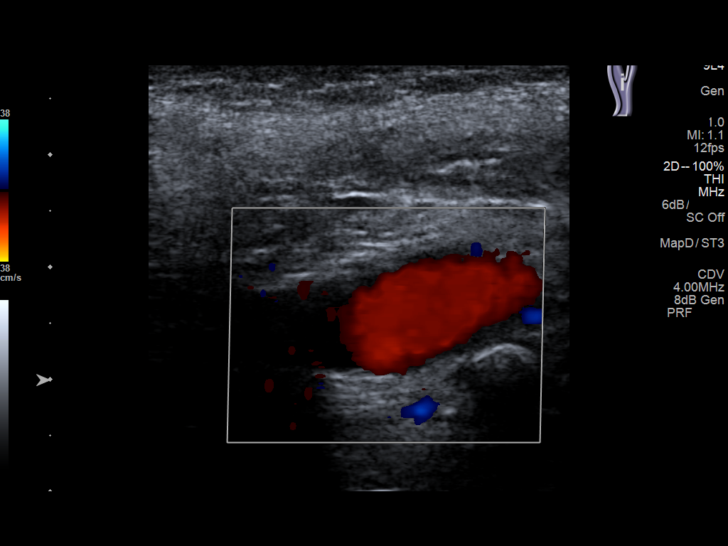
[im 16/59]
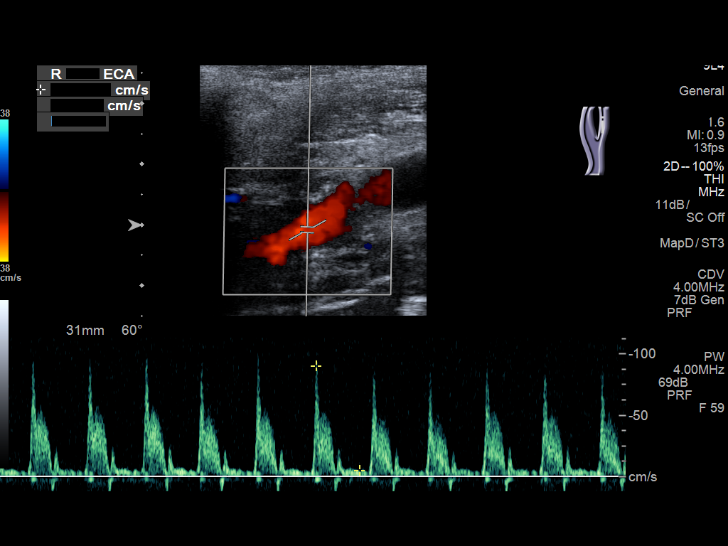
[im 21/59]
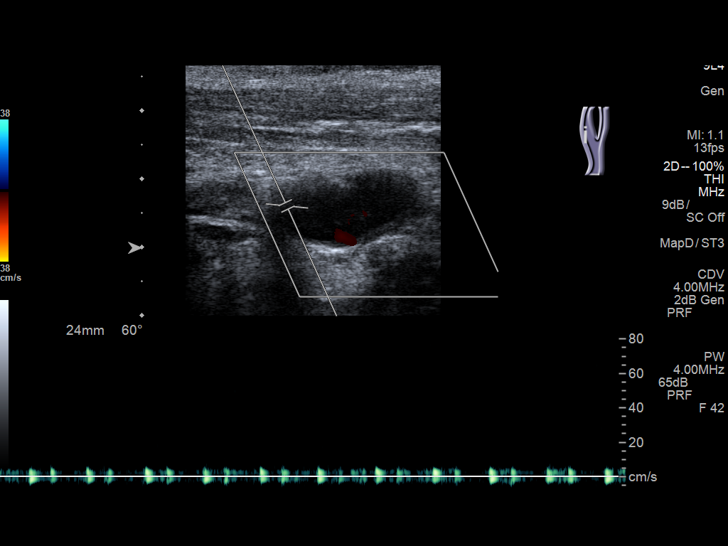
[im 26/59]
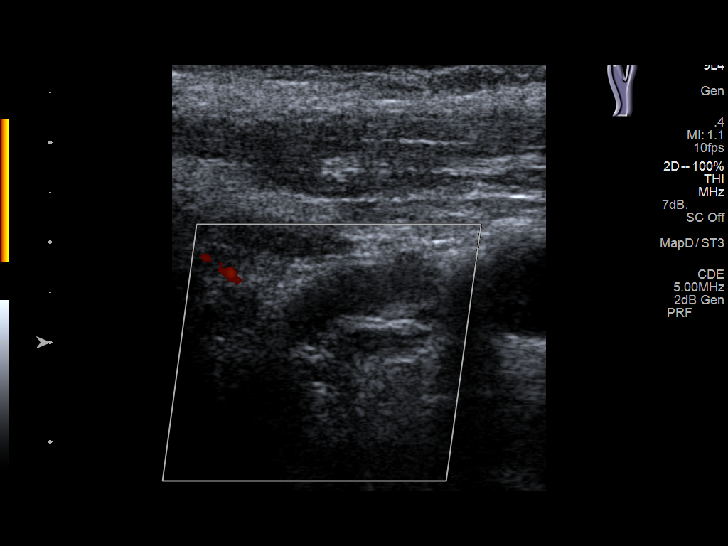
[im 31/59]
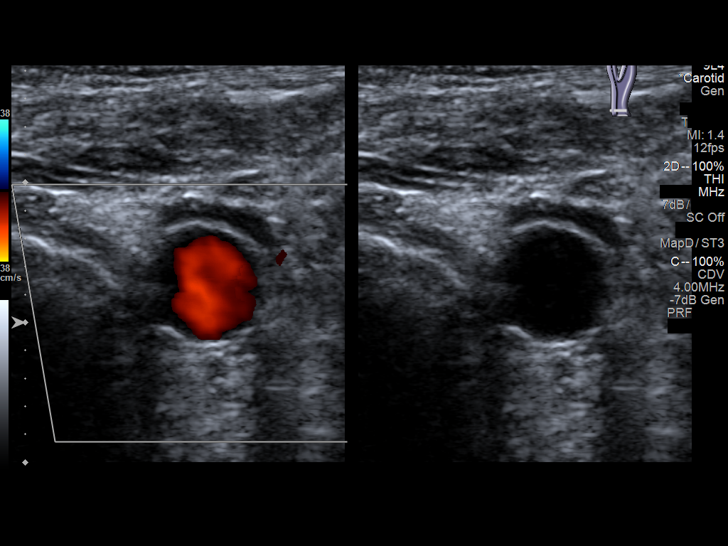
[im 33/59]
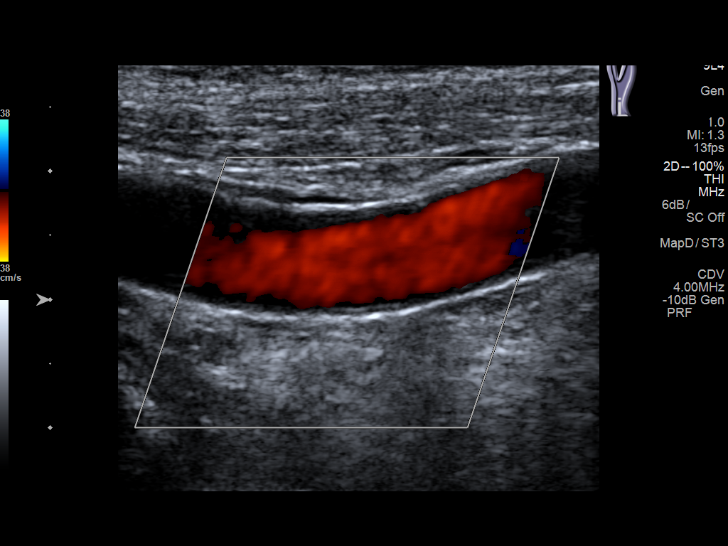
[im 38/59]
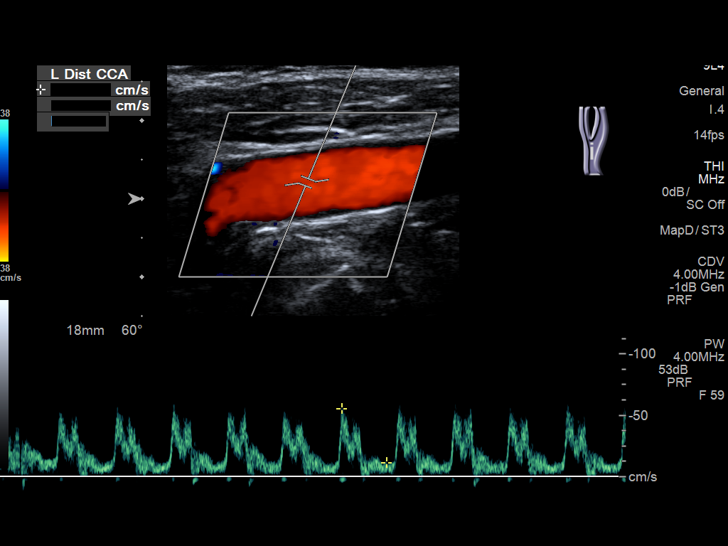
[im 43/59]
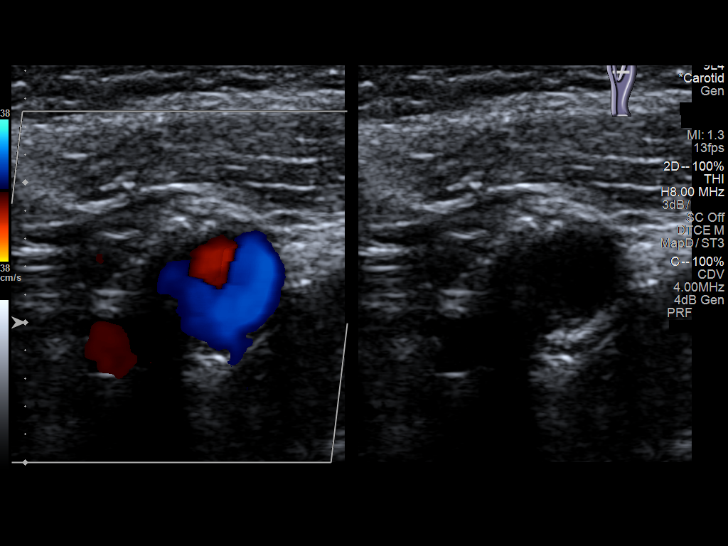
[im 48/59]
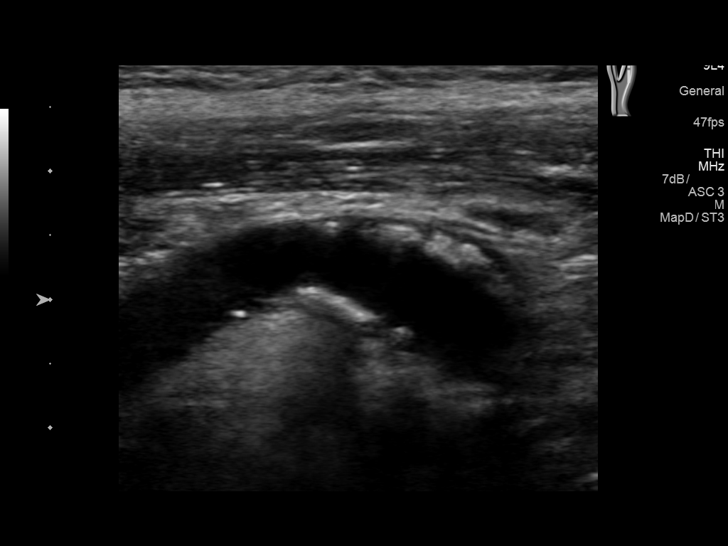
[im 53/59]
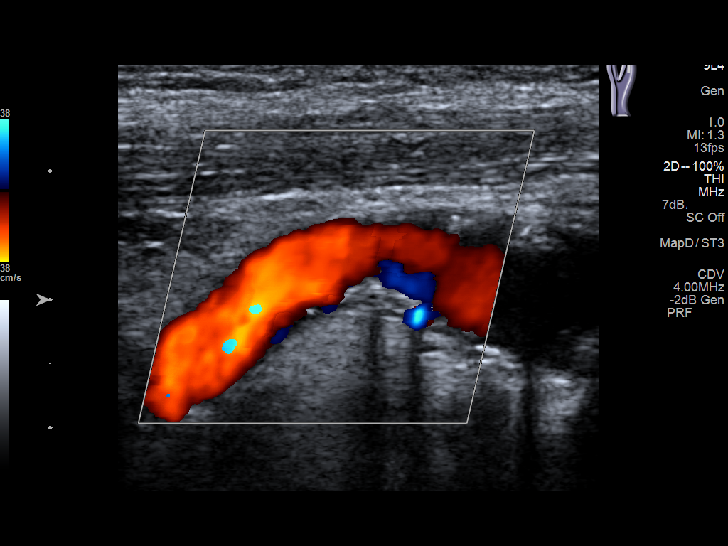
[im 59/59]
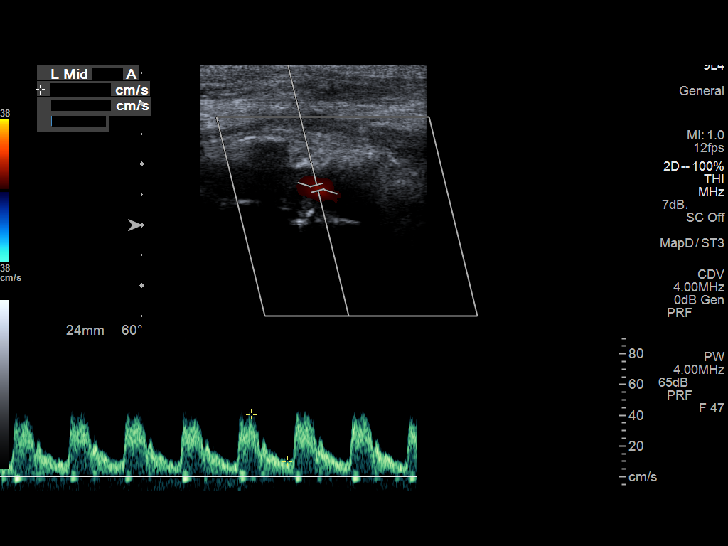

[13 of 24 positions shown; findings below may reference images not displayed]

FINDINGS: Criteria: Quantification of carotid stenosis is based on velocity
parameters that correlate the residual internal carotid diameter
with NASCET-based stenosis levels, using the diameter of the distal
internal carotid lumen as the denominator for stenosis measurement.

The following velocity measurements were obtained:

RIGHT

ICA:  Occluded cm/sec

CCA:  36 cm/sec

SYSTOLIC ICA/CCA RATIO:  Not applicable

DIASTOLIC ICA/CCA RATIO:  Not applicable

ECA:  90 cm/sec

LEFT

ICA:  114 cm/sec

CCA:  56 cm/sec

SYSTOLIC ICA/CCA RATIO:

DIASTOLIC ICA/CCA RATIO:

ECA:  58 cm/sec

RIGHT CAROTID ARTERY: There is moderate calcified plaque in the
bulb. Above the bulb, the right internal carotid artery is occluded
based on color flow and Doppler waveform imaging.

RIGHT VERTEBRAL ARTERY:  Antegrade.

LEFT CAROTID ARTERY: Moderate calcified plaque in the bulb. Low
resistance internal carotid Doppler pattern.

LEFT VERTEBRAL ARTERY:  Antegrade.
IMPRESSION: Right internal carotid artery occlusion.

Less than 50% stenosis in the left internal carotid artery.

## 2018-09-22 ENCOUNTER — Emergency Department
Admission: EM | Admit: 2018-09-22 | Discharge: 2018-09-22 | Disposition: A | Discharge status: Home / Self Care | Payer: Medicare Other | Attending: Student in an Organized Health Care Education/Training Program | Admitting: Student in an Organized Health Care Education/Training Program

## 2018-09-22 ENCOUNTER — Other Ambulatory Visit: Payer: Self-pay

## 2018-09-22 DIAGNOSIS — F0390 Unspecified dementia without behavioral disturbance: Secondary | ICD-10-CM | POA: Diagnosis not present

## 2018-09-22 DIAGNOSIS — Z7982 Long term (current) use of aspirin: Secondary | ICD-10-CM | POA: Diagnosis not present

## 2018-09-22 DIAGNOSIS — G20 Parkinson's disease: Secondary | ICD-10-CM | POA: Diagnosis not present

## 2018-09-22 DIAGNOSIS — E876 Hypokalemia: Secondary | ICD-10-CM | POA: Insufficient documentation

## 2018-09-22 DIAGNOSIS — E119 Type 2 diabetes mellitus without complications: Secondary | ICD-10-CM | POA: Diagnosis not present

## 2018-09-22 DIAGNOSIS — Z79899 Other long term (current) drug therapy: Secondary | ICD-10-CM | POA: Insufficient documentation

## 2018-09-22 DIAGNOSIS — I10 Essential (primary) hypertension: Secondary | ICD-10-CM | POA: Insufficient documentation

## 2018-09-22 DIAGNOSIS — M79672 Pain in left foot: Secondary | ICD-10-CM | POA: Diagnosis present

## 2018-09-22 DIAGNOSIS — F17210 Nicotine dependence, cigarettes, uncomplicated: Secondary | ICD-10-CM | POA: Diagnosis not present

## 2018-09-22 DIAGNOSIS — I2510 Atherosclerotic heart disease of native coronary artery without angina pectoris: Secondary | ICD-10-CM | POA: Insufficient documentation

## 2018-09-22 LAB — URINALYSIS, COMPLETE (UACMP) WITH MICROSCOPIC
Bilirubin Urine: NEGATIVE
Glucose, UA: 50 mg/dL — AB
Hgb urine dipstick: NEGATIVE
Ketones, ur: NEGATIVE mg/dL
Leukocytes,Ua: NEGATIVE
Nitrite: NEGATIVE
PROTEIN: 100 mg/dL — AB
Specific Gravity, Urine: 1.015 (ref 1.005–1.030)
Squamous Epithelial / HPF: NONE SEEN (ref 0–5)
pH: 6 (ref 5.0–8.0)

## 2018-09-22 LAB — CBC
HCT: 32.9 % — ABNORMAL LOW (ref 39.0–52.0)
Hemoglobin: 11.5 g/dL — ABNORMAL LOW (ref 13.0–17.0)
MCH: 29.7 pg (ref 26.0–34.0)
MCHC: 35 g/dL (ref 30.0–36.0)
MCV: 85 fL (ref 80.0–100.0)
Platelets: 345 10*3/uL (ref 150–400)
RBC: 3.87 MIL/uL — AB (ref 4.22–5.81)
RDW: 13.4 % (ref 11.5–15.5)
WBC: 9.1 10*3/uL (ref 4.0–10.5)
nRBC: 0 % (ref 0.0–0.2)

## 2018-09-22 LAB — COMPREHENSIVE METABOLIC PANEL
ALT: 6 U/L (ref 0–44)
AST: 29 U/L (ref 15–41)
Albumin: 3.9 g/dL (ref 3.5–5.0)
Alkaline Phosphatase: 73 U/L (ref 38–126)
Anion gap: 14 (ref 5–15)
BUN: 39 mg/dL — ABNORMAL HIGH (ref 8–23)
CO2: 26 mmol/L (ref 22–32)
CREATININE: 1.3 mg/dL — AB (ref 0.61–1.24)
Calcium: 9.8 mg/dL (ref 8.9–10.3)
Chloride: 93 mmol/L — ABNORMAL LOW (ref 98–111)
GFR calc Af Amer: 58 mL/min — ABNORMAL LOW (ref >60)
GFR calc non Af Amer: 50 mL/min — ABNORMAL LOW (ref >60)
Glucose, Bld: 293 mg/dL — ABNORMAL HIGH (ref 70–99)
Potassium: 3 mmol/L — ABNORMAL LOW (ref 3.5–5.1)
Sodium: 133 mmol/L — ABNORMAL LOW (ref 135–145)
Total Bilirubin: 0.6 mg/dL (ref 0.3–1.2)
Total Protein: 7 g/dL (ref 6.5–8.1)

## 2018-09-22 LAB — GLUCOSE, CAPILLARY: Glucose-Capillary: 271 mg/dL — ABNORMAL HIGH (ref 70–99)

## 2018-09-22 MED ORDER — SODIUM CHLORIDE 0.9 % IV BOLUS: 500.0000 mL | Freq: Once | INTRAVENOUS | Status: DC

## 2018-09-22 MED ORDER — CLONIDINE HCL 0.1 MG PO TABS
0.1000 mg | ORAL_TABLET | Freq: Once | ORAL | Status: AC
  Administered 2018-09-22: 0.1 mg via ORAL
  Filled 2018-09-22: qty 1

## 2018-09-22 MED ORDER — POTASSIUM CHLORIDE ER 10 MEQ PO TBCR: 20.0000 meq | EXTENDED_RELEASE_TABLET | Freq: Every day | ORAL | 0 refills | Status: DC

## 2018-09-22 MED ORDER — ONDANSETRON 4 MG PO TBDP: 4.0000 mg | ORAL_TABLET | Freq: Three times a day (TID) | ORAL | 0 refills | Status: DC | PRN

## 2018-09-22 MED ORDER — POTASSIUM CHLORIDE CRYS ER 20 MEQ PO TBCR
40.0000 meq | EXTENDED_RELEASE_TABLET | Freq: Once | ORAL | Status: AC
  Administered 2018-09-22: 40 meq via ORAL
  Filled 2018-09-22: qty 2

## 2018-09-22 NOTE — ED Notes (Signed)
Family at bedside. 

## 2018-09-22 NOTE — ED Triage Notes (Signed)
Pt arrives with family who state pt with blood sugar of 350 at home and burning feet. Pt complains of foot pain, bilateral feet. Pt with history of dementia. Pt states his feet have been painful for possibly weeks.

## 2018-09-22 NOTE — Discharge Instructions (Signed)
Please have your basic metabolic panel repeated in 1 to 2 weeks.

## 2018-09-22 NOTE — ED Notes (Signed)
Patient given PO fluids and encouraged to drink.  

## 2018-09-22 NOTE — ED Provider Notes (Signed)
[facility] Emergency Department Provider Note    First MD Initiated Contact with Patient 09/22/18 [phone]     (approximate)  I have reviewed the triage vital signs and the nursing notes.   HISTORY  Chief Complaint Foot Pain  Level V caveat:  dementia  HPI Dennis Serrano is a 85 y.o. male plosive past medical history presents to the ER reportedly for evaluation of bilateral foot burning sensation for the past several weeks.  This was reported to the nurses twice however when I went to evaluate the patient and asked him what symptoms he was having he stated "hell I dont know."  He denies any abdominal pain chest pain or shortness of breath.  Denies any recent fevers.    Past Medical History:  Diagnosis Date  . Anxiety   . CAD (coronary artery disease)   . Dementia ([place])   . Diabetes mellitus without complication ([place])   . GERD (gastroesophageal reflux disease)   . History of heart artery stent   . HLD (hyperlipidemia)   . HTN (hypertension)   . Parkinson disease ([place])   . Prostate cancer ([place])    over 5 years ago   Family History  Problem Relation Age of Onset  . Heart disease Mother   . Heart disease Father   . Cancer Sister   . Cancer Brother    Past Surgical History:  Procedure Laterality Date  . APPENDECTOMY    . CORONARY STENT PLACEMENT    . EYE SURGERY    . TRANSURETHRAL RESECTION OF PROSTATE     Patient Active Problem List   Diagnosis Date Noted  . Medication overdose 09/02/2018  . TIA (transient ischemic attack) 10/20/2016  . Pressure injury of skin 10/07/2016  . Sepsis ([place]) 10/02/2016  . CAP (community acquired pneumonia) 10/02/2016  . HTN (hypertension) 10/02/2016  . HLD (hyperlipidemia) 10/02/2016  . CAD (coronary artery disease) 10/02/2016  . GERD (gastroesophageal reflux disease) 10/02/2016      Prior to Admission medications   Medication Sig Start Date End Date Taking? Authorizing Provider  acetaminophen (TYLENOL)  500 MG tablet Take 500 mg by mouth every 8 (eight) hours.    [provider]  albuterol (PROVENTIL HFA;VENTOLIN HFA) 108 (90 Base) MCG/ACT inhaler Inhale 2 puffs into the lungs every 6 (six) hours as needed for wheezing or shortness of breath. 10/07/16   [name], MD  aspirin 325 MG tablet Take 1 tablet (325 mg total) by mouth daily. Patient taking differently: Take 81 mg by mouth daily.  10/08/16   [name], MD  carbidopa-levodopa (SINEMET IR) 25-100 MG tablet Take 1 tablet by mouth 3 (three) times daily.    [provider]  cholecalciferol (VITAMIN D) 25 MCG (1000 UT) tablet Take 2,000 Units by mouth daily.    [provider]  cloNIDine (CATAPRES) 0.1 MG tablet Take 0.1 mg by mouth 2 (two) times daily. 08/14/18   [provider]  diclofenac sodium (VOLTAREN) 1 % GEL Apply 2 g topically 4 (four) times daily.    [provider]  diltiazem (CARDIZEM CD) 180 MG 24 hr capsule Take 1 capsule (180 mg total) by mouth daily. 09/06/18   Dustin Flock, MD  docusate sodium (COLACE) 100 MG capsule Take 100 mg by mouth 2 (two) times daily.    [provider]  furosemide (LASIX) 20 MG tablet Take 20 mg by mouth daily. 08/20/18   [provider]  gemfibrozil (LOPID) 600 MG tablet Take 600  mg by mouth 2 (two) times daily. 08/20/18   [provider]  hydrALAZINE (APRESOLINE) 100 MG tablet Take 100 mg by mouth 4 (four) times daily. 08/20/18   [provider]  insulin detemir (LEVEMIR) 100 UNIT/ML injection Inject 0.12 mLs (12 Units total) into the skin at bedtime. Patient taking differently: Inject 39 Units into the skin every morning.  10/07/16   [name], MD  lisinopril (PRINIVIL,ZESTRIL) 40 MG tablet Take 40 mg by mouth daily.    [provider]  Magnesium Gluconate 500 (27 Mg) MG TABS Take 1,000 mg by mouth 3 (three) times daily.    [provider]  magnesium hydroxide (MILK OF  MAGNESIA) 400 MG/5ML suspension Take 30 mLs by mouth daily as needed for mild constipation.    [provider]  ondansetron (ZOFRAN ODT) 4 MG disintegrating tablet Take 1 tablet (4 mg total) by mouth every 8 (eight) hours as needed for nausea or vomiting. Patient not taking: Reported on 09/02/2018 08/19/17   [name], MD  ondansetron (ZOFRAN ODT) 4 MG disintegrating tablet Take 1 tablet (4 mg total) by mouth every 8 (eight) hours as needed for nausea or vomiting. 09/22/18   Merlyn Lot, MD  potassium chloride (K-DUR) 10 MEQ tablet Take 2 tablets (20 mEq total) by mouth daily for 3 days. 09/22/18 09/25/18  Merlyn Lot, MD  potassium chloride (K-DUR,KLOR-CON) 10 MEQ tablet Take 1 tablet (10 mEq total) by mouth 2 (two) times daily. 08/19/17   [name], MD    Allergies Patient has no known allergies.    Social History Social History   Tobacco Use  . Smoking status: Former Smoker    Types: Cigarettes    Last attempt to quit: 1980    Years since quitting: 40.2  . Smokeless tobacco: Never Used  Substance Use Topics  . Alcohol use: No  . Drug use: No    Review of Systems Patient denies headaches, rhinorrhea, blurry vision, numbness, shortness of breath, chest pain, edema, cough, abdominal pain, nausea, vomiting, diarrhea, dysuria, fevers, rashes or hallucinations unless otherwise stated above in HPI. ____________________________________________   PHYSICAL EXAM:  VITAL SIGNS: Vitals:   09/22/18 0216  BP: (!) 179/61  Pulse: 75  Resp: 14  Temp: 97.9 F (36.6 C)  SpO2: 99%    Constitutional: Alert, pleasant in no acute distress  Eyes: Conjunctivae are normal.  Head: Atraumatic. Nose: No congestion/rhinnorhea. Mouth/Throat: Mucous membranes are moist.   Neck: No stridor. Painless ROM.  Cardiovascular: Normal rate, regular rhythm. Grossly normal heart sounds.  Good peripheral circulation. Respiratory: Normal respiratory effort.  No retractions.  Lungs CTAB. Gastrointestinal: Soft and nontender. No distention. No abdominal bruits. No CVA tenderness. Genitourinary: normal external genitalia Musculoskeletal: No lower extremity tenderness nor edema.  No joint effusions.  Well perfused distally BLE Neurologic:  Normal speech and language. No gross focal neurologic deficits are appreciated. No facial droop Skin:  Skin is warm, dry and intact. No rash noted. Psychiatric: Mood and affect are normal. Speech and behavior are normal.  ____________________________________________   LABS (all labs ordered are listed, but only abnormal results are displayed)  Results for orders placed or performed during the hospital encounter of 09/22/18 (from the past 24 hour(s))  CBC     Status: Abnormal   Collection Time: 09/22/18  2:19 AM  Result Value Ref Range   WBC 9.1 4.0 - 10.5 K/uL   RBC 3.87 (L) 4.22 - 5.81 MIL/uL   Hemoglobin 11.5 (L) 13.0 - 17.0 g/dL  HCT 32.9 (L) 39.0 - 52.0 %   MCV 85.0 80.0 - 100.0 fL   MCH 29.7 26.0 - 34.0 pg   MCHC 35.0 30.0 - 36.0 g/dL   RDW 13.4 11.5 - 15.5 %   Platelets 345 150 - 400 K/uL   nRBC 0.0 0.0 - 0.2 %  Comprehensive metabolic panel     Status: Abnormal   Collection Time: 09/22/18  2:19 AM  Result Value Ref Range   Sodium 133 (L) 135 - 145 mmol/L   Potassium 3.0 (L) 3.5 - 5.1 mmol/L   Chloride 93 (L) 98 - 111 mmol/L   CO2 26 22 - 32 mmol/L   Glucose, Bld 293 (H) 70 - 99 mg/dL   BUN 39 (H) 8 - 23 mg/dL   Creatinine, Ser 1.30 (H) 0.61 - 1.24 mg/dL   Calcium 9.8 8.9 - 10.3 mg/dL   Total Protein 7.0 6.5 - 8.1 g/dL   Albumin 3.9 3.5 - 5.0 g/dL   AST 29 15 - 41 U/L   ALT 6 0 - 44 U/L   Alkaline Phosphatase 73 38 - 126 U/L   Total Bilirubin 0.6 0.3 - 1.2 mg/dL   GFR calc non Af Amer 50 (L) >60 mL/min   GFR calc Af Amer 58 (L) >60 mL/min   Anion gap 14 5 - 15  Glucose, capillary     Status: Abnormal   Collection Time: 09/22/18  2:19 AM  Result Value Ref Range   Glucose-Capillary 271 (H) 70 - 99  mg/dL  Urinalysis, Complete w Microscopic     Status: Abnormal   Collection Time: 09/22/18  3:41 AM  Result Value Ref Range   Color, Urine YELLOW (A) YELLOW   APPearance CLEAR (A) CLEAR   Specific Gravity, Urine 1.015 1.005 - 1.030   pH 6.0 5.0 - 8.0   Glucose, UA 50 (A) NEGATIVE mg/dL   Hgb urine dipstick NEGATIVE NEGATIVE   Bilirubin Urine NEGATIVE NEGATIVE   Ketones, ur NEGATIVE NEGATIVE mg/dL   Protein, ur 100 (A) NEGATIVE mg/dL   Nitrite NEGATIVE NEGATIVE   Leukocytes,Ua NEGATIVE NEGATIVE   RBC / HPF 0-5 0 - 5 RBC/hpf   WBC, UA 0-5 0 - 5 WBC/hpf   Bacteria, UA RARE (A) NONE SEEN   Squamous Epithelial / LPF NONE SEEN 0 - 5   ____________________________________________ ____________________________________________  RADIOLOGY   ____________________________________________   PROCEDURES  Procedure(s) performed:  Procedures    Critical Care performed: no ____________________________________________   INITIAL IMPRESSION / ASSESSMENT AND PLAN / ED COURSE  Pertinent labs & imaging results that were available during my care of the patient were reviewed by me and considered in my medical decision making (see chart for details).   DDX: Dehydration, hyperglycemia, DKA, HHS, electrolyte abnormality  Dennis Serrano is a 85 y.o. who presents to the ED with symptoms as described above.  Patient pleasant and in no acute distress.  Seems to be asymptomatic at this time.  Blood work sent for blood differential as he is a poor historian.  His exam is reassuring.  No evidence of infectious process.  Abdominal exam is soft and benign.  No evidence of AKI.  Potassium is low repleted with oral potassium.  Do not want to make any corrections to his glucose at this time given his recent admission with hypoglycemia.  Believe he stable and appropriate for outpatient follow-up.      As part of my medical decision making, I reviewed the following data within the electronic  MEDICAL RECORD NUMBER  Nursing notes reviewed and incorporated, Labs reviewed, notes from prior ED visits and [place] Controlled Substance Database   ____________________________________________   FINAL CLINICAL IMPRESSION(S) / ED DIAGNOSES  Final diagnoses:  Hypokalemia      NEW MEDICATIONS STARTED DURING THIS VISIT:  New Prescriptions   ONDANSETRON (ZOFRAN ODT) 4 MG DISINTEGRATING TABLET    Take 1 tablet (4 mg total) by mouth every 8 (eight) hours as needed for nausea or vomiting.   POTASSIUM CHLORIDE (K-DUR) 10 MEQ TABLET    Take 2 tablets (20 mEq total) by mouth daily for 3 days.     Note:  This document was prepared using Dragon voice recognition software and may include unintentional dictation errors.    Merlyn Lot, MD 09/22/18 [phone]

## 2018-12-02 ENCOUNTER — Emergency Department
Admission: EM | Admit: 2018-12-02 | Discharge: 2018-12-02 | Disposition: A | Discharge status: Home / Self Care | Payer: Medicare Other | Attending: Emergency Medicine | Admitting: Emergency Medicine

## 2018-12-02 ENCOUNTER — Emergency Department: Payer: Medicare Other

## 2018-12-02 ENCOUNTER — Other Ambulatory Visit: Payer: Self-pay

## 2018-12-02 ENCOUNTER — Encounter: Payer: Self-pay | Admitting: Radiology

## 2018-12-02 DIAGNOSIS — F0390 Unspecified dementia without behavioral disturbance: Secondary | ICD-10-CM | POA: Insufficient documentation

## 2018-12-02 DIAGNOSIS — R109 Unspecified abdominal pain: Secondary | ICD-10-CM | POA: Diagnosis present

## 2018-12-02 DIAGNOSIS — Z87891 Personal history of nicotine dependence: Secondary | ICD-10-CM | POA: Diagnosis not present

## 2018-12-02 DIAGNOSIS — Z794 Long term (current) use of insulin: Secondary | ICD-10-CM | POA: Insufficient documentation

## 2018-12-02 DIAGNOSIS — Z7982 Long term (current) use of aspirin: Secondary | ICD-10-CM | POA: Diagnosis not present

## 2018-12-02 DIAGNOSIS — Z8546 Personal history of malignant neoplasm of prostate: Secondary | ICD-10-CM | POA: Insufficient documentation

## 2018-12-02 DIAGNOSIS — R110 Nausea: Secondary | ICD-10-CM | POA: Diagnosis not present

## 2018-12-02 DIAGNOSIS — Z79899 Other long term (current) drug therapy: Secondary | ICD-10-CM | POA: Insufficient documentation

## 2018-12-02 DIAGNOSIS — I10 Essential (primary) hypertension: Secondary | ICD-10-CM | POA: Insufficient documentation

## 2018-12-02 DIAGNOSIS — I2510 Atherosclerotic heart disease of native coronary artery without angina pectoris: Secondary | ICD-10-CM | POA: Diagnosis not present

## 2018-12-02 DIAGNOSIS — G20 Parkinson's disease: Secondary | ICD-10-CM | POA: Insufficient documentation

## 2018-12-02 LAB — CBC WITH DIFFERENTIAL/PLATELET
Abs Immature Granulocytes: 0.04 10*3/uL (ref 0.00–0.07)
Basophils Absolute: 0 10*3/uL (ref 0.0–0.1)
Basophils Relative: 0 %
Eosinophils Absolute: 0.2 10*3/uL (ref 0.0–0.5)
Eosinophils Relative: 3 %
HCT: 36.6 % — ABNORMAL LOW (ref 39.0–52.0)
Hemoglobin: 12.3 g/dL — ABNORMAL LOW (ref 13.0–17.0)
Immature Granulocytes: 1 %
Lymphocytes Relative: 25 %
Lymphs Abs: 1.8 10*3/uL (ref 0.7–4.0)
MCH: 28.6 pg (ref 26.0–34.0)
MCHC: 33.6 g/dL (ref 30.0–36.0)
MCV: 85.1 fL (ref 80.0–100.0)
Monocytes Absolute: 0.7 10*3/uL (ref 0.1–1.0)
Monocytes Relative: 9 %
Neutro Abs: 4.6 10*3/uL (ref 1.7–7.7)
Neutrophils Relative %: 62 %
Platelets: 381 10*3/uL (ref 150–400)
RBC: 4.3 MIL/uL (ref 4.22–5.81)
RDW: 13.2 % (ref 11.5–15.5)
WBC: 7.3 10*3/uL (ref 4.0–10.5)
nRBC: 0 % (ref 0.0–0.2)

## 2018-12-02 LAB — COMPREHENSIVE METABOLIC PANEL
ALT: 6 U/L (ref 0–44)
AST: 28 U/L (ref 15–41)
Albumin: 3.4 g/dL — ABNORMAL LOW (ref 3.5–5.0)
Alkaline Phosphatase: 96 U/L (ref 38–126)
Anion gap: 10 (ref 5–15)
BUN: 20 mg/dL (ref 8–23)
CO2: 27 mmol/L (ref 22–32)
Calcium: 9.2 mg/dL (ref 8.9–10.3)
Chloride: 99 mmol/L (ref 98–111)
Creatinine, Ser: 0.89 mg/dL (ref 0.61–1.24)
GFR calc Af Amer: >60 mL/min (ref >60)
GFR calc non Af Amer: >60 mL/min (ref >60)
Glucose, Bld: 134 mg/dL — ABNORMAL HIGH (ref 70–99)
Potassium: 3.5 mmol/L (ref 3.5–5.1)
Sodium: 136 mmol/L (ref 135–145)
Total Bilirubin: 0.4 mg/dL (ref 0.3–1.2)
Total Protein: 6.8 g/dL (ref 6.5–8.1)

## 2018-12-02 LAB — URINALYSIS, COMPLETE (UACMP) WITH MICROSCOPIC
Bacteria, UA: NONE SEEN
Bilirubin Urine: NEGATIVE
Glucose, UA: 50 mg/dL — AB
Hgb urine dipstick: NEGATIVE
Ketones, ur: NEGATIVE mg/dL
Leukocytes,Ua: NEGATIVE
Nitrite: NEGATIVE
Protein, ur: 100 mg/dL — AB
Specific Gravity, Urine: 1.018 (ref 1.005–1.030)
Squamous Epithelial / HPF: NONE SEEN (ref 0–5)
pH: 6 (ref 5.0–8.0)

## 2018-12-02 LAB — LIPASE, BLOOD: Lipase: 31 U/L (ref 11–51)

## 2018-12-02 MED ORDER — IOHEXOL 300 MG/ML  SOLN
100.0000 mL | Freq: Once | INTRAMUSCULAR | Status: AC | PRN
  Administered 2018-12-02: 100 mL via INTRAVENOUS

## 2018-12-02 NOTE — ED Notes (Signed)
Pt provided with urinal

## 2018-12-02 NOTE — ED Notes (Signed)
Pt ringing call bell, states "when can I go home?" pt informed again that pt is waiting on family to pick him up.

## 2018-12-02 NOTE — ED Notes (Signed)
Pt assisted with urinal

## 2018-12-02 NOTE — ED Triage Notes (Signed)
Pt with left sided flank pain for three days that is not hurting currently. Pt states has had nausea. Pt is not able to give any further information.

## 2018-12-02 NOTE — ED Notes (Signed)
md aware of continued hypertension.

## 2018-12-02 NOTE — ED Notes (Signed)
Pt informed that he can be discharged when his family comes to pick him up.

## 2018-12-02 NOTE — ED Notes (Signed)
Patient asked if he was going to go home soon. Patient was informed that the doctor was in an emergency situation and it would be awhile before he could make that decision. Patient states his son would be here to pick him up.

## 2018-12-02 NOTE — Discharge Instructions (Addendum)
As we discussed please talk to your doctor about getting a repeat CT done of your kidneys in roughly 6 months for the cyst type lesion seen in your right kidney.  Please seek medical attention for any high fevers, chest pain, shortness of breath, change in behavior, persistent vomiting, bloody stool or any other new or concerning symptoms.

## 2018-12-02 NOTE — ED Notes (Signed)
md aware of htn, no new orders received.

## 2018-12-02 NOTE — ED Notes (Signed)
Family called stating they are on their way to the hospital to pick patient up.  Patient notified.

## 2018-12-02 NOTE — ED Notes (Signed)
Waiting on pt's granddaughter to pick up. md aware of continued hypertension and orders discharge with this knowledge.

## 2018-12-02 NOTE — ED Notes (Signed)
AAOx3.  Skin warm and dry.  NAD 

## 2018-12-02 NOTE — ED Notes (Signed)
Report to jessica, rn

## 2018-12-02 NOTE — ED Notes (Signed)
Continue to wait for pt's family to arrive for discharge. Pt refuses additional vital signs at this time. Pt states "do I have to stick my finger down my throat and throw up before you'll let me go?" pt informed again that he is waiting on family to come and pick him up.

## 2018-12-02 NOTE — ED Provider Notes (Signed)
[facility] Emergency Department Provider Note   ____________________________________________   I have reviewed the triage vital signs and the nursing notes.   HISTORY  Chief Complaint Flank Pain   History limited by: Dementia  HPI Dennis Serrano is a 85 y.o. male who presents to the emergency department today because of concern for left flank pain. Patient states he has been hurting for 1-2 days. Today the pain was getting a little worse however at the time of my exam he states it has eased off. He denies any radiation of the pain. He says he had a fall but is unsure when that was. He has not noticed any change in his urine or bowel movements. He denies any fevers.     Records reviewed. Per medical record review patient has a history of dementia, DM, GERD.   Past Medical History:  Diagnosis Date  . Anxiety   . CAD (coronary artery disease)   . Dementia ([place])   . Diabetes mellitus without complication ([place])   . GERD (gastroesophageal reflux disease)   . History of heart artery stent   . HLD (hyperlipidemia)   . HTN (hypertension)   . Parkinson disease ([place])   . Prostate cancer ([place])    over 5 years ago    Patient Active Problem List   Diagnosis Date Noted  . Medication overdose 09/02/2018  . TIA (transient ischemic attack) 10/20/2016  . Pressure injury of skin 10/07/2016  . Sepsis ([place]) 10/02/2016  . CAP (community acquired pneumonia) 10/02/2016  . HTN (hypertension) 10/02/2016  . HLD (hyperlipidemia) 10/02/2016  . CAD (coronary artery disease) 10/02/2016  . GERD (gastroesophageal reflux disease) 10/02/2016    Past Surgical History:  Procedure Laterality Date  . APPENDECTOMY    . CORONARY STENT PLACEMENT    . EYE SURGERY    . TRANSURETHRAL RESECTION OF PROSTATE      Prior to Admission medications   Medication Sig Start Date End Date Taking? Authorizing Provider  acetaminophen (TYLENOL) 500 MG tablet Take 500 mg by mouth every 8  (eight) hours.    [provider]  albuterol (PROVENTIL HFA;VENTOLIN HFA) 108 (90 Base) MCG/ACT inhaler Inhale 2 puffs into the lungs every 6 (six) hours as needed for wheezing or shortness of breath. 10/07/16   [name], MD  aspirin 325 MG tablet Take 1 tablet (325 mg total) by mouth daily. Patient taking differently: Take 81 mg by mouth daily.  10/08/16   [name], MD  carbidopa-levodopa (SINEMET IR) 25-100 MG tablet Take 1 tablet by mouth 3 (three) times daily.    [provider]  cholecalciferol (VITAMIN D) 25 MCG (1000 UT) tablet Take 2,000 Units by mouth daily.    [provider]  cloNIDine (CATAPRES) 0.1 MG tablet Take 0.1 mg by mouth 2 (two) times daily. 08/14/18   [provider]  diclofenac sodium (VOLTAREN) 1 % GEL Apply 2 g topically 4 (four) times daily.    [provider]  diltiazem (CARDIZEM CD) 180 MG 24 hr capsule Take 1 capsule (180 mg total) by mouth daily. 09/06/18   Dustin Flock, MD  docusate sodium (COLACE) 100 MG capsule Take 100 mg by mouth 2 (two) times daily.    [provider]  furosemide (LASIX) 20 MG tablet Take 20 mg by mouth daily. 08/20/18   [provider]  gemfibrozil (LOPID) 600 MG tablet Take 600 mg by mouth 2 (two) times daily. 08/20/18   [provider]  hydrALAZINE (APRESOLINE) 100  MG tablet Take 100 mg by mouth 4 (four) times daily. 08/20/18   [provider]  insulin detemir (LEVEMIR) 100 UNIT/ML injection Inject 0.12 mLs (12 Units total) into the skin at bedtime. Patient taking differently: Inject 39 Units into the skin every morning.  10/07/16   [name], MD  lisinopril (PRINIVIL,ZESTRIL) 40 MG tablet Take 40 mg by mouth daily.    [provider]  Magnesium Gluconate 500 (27 Mg) MG TABS Take 1,000 mg by mouth 3 (three) times daily.    [provider]  magnesium hydroxide (MILK OF MAGNESIA) 400 MG/5ML suspension Take 30 mLs by  mouth daily as needed for mild constipation.    [provider]  ondansetron (ZOFRAN ODT) 4 MG disintegrating tablet Take 1 tablet (4 mg total) by mouth every 8 (eight) hours as needed for nausea or vomiting. Patient not taking: Reported on 09/02/2018 08/19/17   [name], MD  ondansetron (ZOFRAN ODT) 4 MG disintegrating tablet Take 1 tablet (4 mg total) by mouth every 8 (eight) hours as needed for nausea or vomiting. 09/22/18   Merlyn Lot, MD  potassium chloride (K-DUR) 10 MEQ tablet Take 2 tablets (20 mEq total) by mouth daily for 3 days. 09/22/18 09/25/18  Merlyn Lot, MD  potassium chloride (K-DUR,KLOR-CON) 10 MEQ tablet Take 1 tablet (10 mEq total) by mouth 2 (two) times daily. 08/19/17   [name], MD    Allergies Patient has no known allergies.  Family History  Problem Relation Age of Onset  . Heart disease Mother   . Heart disease Father   . Cancer Sister   . Cancer Brother     Social History Social History   Tobacco Use  . Smoking status: Former Smoker    Types: Cigarettes    Last attempt to quit: 1980    Years since quitting: 40.4  . Smokeless tobacco: Never Used  Substance Use Topics  . Alcohol use: No  . Drug use: No    Review of Systems Constitutional: No fever/chills Eyes: No visual changes. ENT: No sore throat. Cardiovascular: Denies chest pain. Respiratory: Denies shortness of breath. Gastrointestinal: Positive for left flank pain.  Genitourinary: Negative for dysuria. Musculoskeletal: Negative for back pain. Skin: Negative for rash. Neurological: Negative for headaches, focal weakness or numbness.  ____________________________________________   PHYSICAL EXAM:  VITAL SIGNS: ED Triage Vitals  Enc Vitals Group     BP 12/02/18 0149 (!) 222/99     Pulse Rate 12/02/18 0149 62     Resp 12/02/18 0149 16     Temp 12/02/18 0150 98.4 F (36.9 C)     Temp Source 12/02/18 0149 Oral     SpO2 12/02/18 0149 99 %     Weight  12/02/18 0151 160 lb (72.6 kg)     Height 12/02/18 0151 5\' 10"  (1.778 m)     Head Circumference --      Peak Flow --      Pain Score 12/02/18 0150 0   Constitutional: Alert and oriented.  Eyes: Conjunctivae are normal.  ENT      Head: Normocephalic and atraumatic.      Nose: No congestion/rhinnorhea.      Mouth/Throat: Mucous membranes are moist.      Neck: No stridor. Hematological/Lymphatic/Immunilogical: No cervical lymphadenopathy. Cardiovascular: Normal rate, regular rhythm.  No murmurs, rubs, or gallops.  Respiratory: Normal respiratory effort without tachypnea nor retractions. Breath sounds are clear and equal bilaterally. No wheezes/rales/rhonchi. Gastrointestinal: Soft and non tender. No rebound. No  guarding.  Genitourinary: Deferred Musculoskeletal: Normal range of motion in all extremities. No lower extremity edema.  No spinal tenderness. Neurologic:  Normal speech and language. No gross focal neurologic deficits are appreciated.  Skin:  Skin is warm, dry and intact. No rash noted. Psychiatric: Mood and affect are normal. Speech and behavior are normal. Patient exhibits appropriate insight and judgment.  ____________________________________________    LABS (pertinent positives/negatives)  Lipase 31 CBC wbc 7.3, hgb 12.3, plt 381 CMP wnl except glu 134, albumin 3.4 UA clear, glu 50, protein 100, 0-5 rbc and wbc  ____________________________________________   EKG  I, Nance Pear, attending physician, personally viewed and interpreted this EKG  EKG Time: 0148 Rate: 64 Rhythm: sinus rhythm Axis: left axis deviation Intervals: qtc 446 QRS: LAFB, LVH ST changes: no st elevation Impression: abnormal ekg  ____________________________________________    RADIOLOGY  CT ab/pel No acute abnormality. Cystic legion in right kidney. Age indeterminate t12 height loss.    ____________________________________________   PROCEDURES  Procedures  ____________________________________________   INITIAL IMPRESSION / ASSESSMENT AND PLAN / ED COURSE  Pertinent labs & imaging results that were available during my care of the patient were reviewed by me and considered in my medical decision making (see chart for details).   Patient presented to the emergency department today because of concerns for left flank pain.  Differential would be broad including pyelonephritis, kidney stone, musculoskeletal cause, aortic aneurysm, intra-abdominal abscess, diverticulitis amongst other etiologies.  Patient's work-up including CT scan without concerning findings.  This point unclear etiology. Discussed with patient importance of primary care follow up.  ____________________________________________   FINAL CLINICAL IMPRESSION(S) / ED DIAGNOSES  Final diagnoses:  Left flank pain     Note: This dictation was prepared with Dragon dictation. Any transcriptional errors that result from this process are unintentional     Nance Pear, MD 12/02/18 [phone]

## 2019-07-31 DIAGNOSIS — Z20822 Contact with and (suspected) exposure to covid-19: Secondary | ICD-10-CM | POA: Diagnosis not present

## 2019-08-01 DIAGNOSIS — R3911 Hesitancy of micturition: Secondary | ICD-10-CM | POA: Diagnosis not present

## 2019-08-01 DIAGNOSIS — E119 Type 2 diabetes mellitus without complications: Secondary | ICD-10-CM | POA: Diagnosis not present

## 2019-08-01 DIAGNOSIS — R05 Cough: Secondary | ICD-10-CM | POA: Diagnosis not present

## 2019-08-01 DIAGNOSIS — E1142 Type 2 diabetes mellitus with diabetic polyneuropathy: Secondary | ICD-10-CM | POA: Diagnosis not present

## 2019-08-01 DIAGNOSIS — I10 Essential (primary) hypertension: Secondary | ICD-10-CM | POA: Diagnosis not present

## 2019-08-01 DIAGNOSIS — R634 Abnormal weight loss: Secondary | ICD-10-CM | POA: Diagnosis not present

## 2019-08-01 DIAGNOSIS — L570 Actinic keratosis: Secondary | ICD-10-CM | POA: Diagnosis not present

## 2019-08-01 DIAGNOSIS — G20 Parkinson's disease: Secondary | ICD-10-CM | POA: Diagnosis not present

## 2019-08-01 DIAGNOSIS — E871 Hypo-osmolality and hyponatremia: Secondary | ICD-10-CM | POA: Diagnosis not present

## 2019-11-06 DIAGNOSIS — I10 Essential (primary) hypertension: Secondary | ICD-10-CM | POA: Diagnosis not present

## 2019-11-06 DIAGNOSIS — E1142 Type 2 diabetes mellitus with diabetic polyneuropathy: Secondary | ICD-10-CM | POA: Diagnosis not present

## 2019-11-06 DIAGNOSIS — Z6822 Body mass index (BMI) 22.0-22.9, adult: Secondary | ICD-10-CM | POA: Diagnosis not present

## 2019-12-26 ENCOUNTER — Emergency Department
Admission: EM | Admit: 2019-12-26 | Discharge: 2019-12-26 | Disposition: A | Discharge status: Home / Self Care | Payer: Medicare HMO | Attending: Emergency Medicine | Admitting: Emergency Medicine

## 2019-12-26 ENCOUNTER — Other Ambulatory Visit: Payer: Self-pay

## 2019-12-26 DIAGNOSIS — E1165 Type 2 diabetes mellitus with hyperglycemia: Secondary | ICD-10-CM | POA: Diagnosis not present

## 2019-12-26 DIAGNOSIS — F0281 Dementia in other diseases classified elsewhere with behavioral disturbance: Secondary | ICD-10-CM | POA: Insufficient documentation

## 2019-12-26 DIAGNOSIS — G20 Parkinson's disease: Secondary | ICD-10-CM | POA: Insufficient documentation

## 2019-12-26 DIAGNOSIS — I2510 Atherosclerotic heart disease of native coronary artery without angina pectoris: Secondary | ICD-10-CM | POA: Diagnosis not present

## 2019-12-26 DIAGNOSIS — Z87891 Personal history of nicotine dependence: Secondary | ICD-10-CM | POA: Insufficient documentation

## 2019-12-26 DIAGNOSIS — Z8546 Personal history of malignant neoplasm of prostate: Secondary | ICD-10-CM | POA: Insufficient documentation

## 2019-12-26 DIAGNOSIS — Z794 Long term (current) use of insulin: Secondary | ICD-10-CM | POA: Insufficient documentation

## 2019-12-26 DIAGNOSIS — I10 Essential (primary) hypertension: Secondary | ICD-10-CM | POA: Insufficient documentation

## 2019-12-26 DIAGNOSIS — R339 Retention of urine, unspecified: Secondary | ICD-10-CM

## 2019-12-26 DIAGNOSIS — Z7982 Long term (current) use of aspirin: Secondary | ICD-10-CM | POA: Diagnosis not present

## 2019-12-26 DIAGNOSIS — Z955 Presence of coronary angioplasty implant and graft: Secondary | ICD-10-CM | POA: Insufficient documentation

## 2019-12-26 DIAGNOSIS — Z79899 Other long term (current) drug therapy: Secondary | ICD-10-CM | POA: Insufficient documentation

## 2019-12-26 DIAGNOSIS — E119 Type 2 diabetes mellitus without complications: Secondary | ICD-10-CM | POA: Insufficient documentation

## 2019-12-26 LAB — CBC WITH DIFFERENTIAL/PLATELET
Abs Immature Granulocytes: 0.02 10*3/uL (ref 0.00–0.07)
Basophils Absolute: 0 10*3/uL (ref 0.0–0.1)
Basophils Relative: 0 %
Eosinophils Absolute: 0.1 10*3/uL (ref 0.0–0.5)
Eosinophils Relative: 2 %
HCT: 29.5 % — ABNORMAL LOW (ref 39.0–52.0)
Hemoglobin: 10.2 g/dL — ABNORMAL LOW (ref 13.0–17.0)
Immature Granulocytes: 0 %
Lymphocytes Relative: 26 %
Lymphs Abs: 1.4 10*3/uL (ref 0.7–4.0)
MCH: 30.1 pg (ref 26.0–34.0)
MCHC: 34.6 g/dL (ref 30.0–36.0)
MCV: 87 fL (ref 80.0–100.0)
Monocytes Absolute: 0.4 10*3/uL (ref 0.1–1.0)
Monocytes Relative: 8 %
Neutro Abs: 3.5 10*3/uL (ref 1.7–7.7)
Neutrophils Relative %: 64 %
Platelets: 361 10*3/uL (ref 150–400)
RBC: 3.39 MIL/uL — ABNORMAL LOW (ref 4.22–5.81)
RDW: 12.8 % (ref 11.5–15.5)
WBC: 5.5 10*3/uL (ref 4.0–10.5)
nRBC: 0 % (ref 0.0–0.2)

## 2019-12-26 LAB — BASIC METABOLIC PANEL
Anion gap: 14 (ref 5–15)
BUN: 27 mg/dL — ABNORMAL HIGH (ref 8–23)
CO2: 20 mmol/L — ABNORMAL LOW (ref 22–32)
Calcium: 9.2 mg/dL (ref 8.9–10.3)
Chloride: 97 mmol/L — ABNORMAL LOW (ref 98–111)
Creatinine, Ser: 0.84 mg/dL (ref 0.61–1.24)
GFR calc Af Amer: >60 mL/min (ref >60)
GFR calc non Af Amer: >60 mL/min (ref >60)
Glucose, Bld: 245 mg/dL — ABNORMAL HIGH (ref 70–99)
Potassium: 4.5 mmol/L (ref 3.5–5.1)
Sodium: 131 mmol/L — ABNORMAL LOW (ref 135–145)

## 2019-12-26 LAB — URINALYSIS, COMPLETE (UACMP) WITH MICROSCOPIC
Bacteria, UA: NONE SEEN
Bilirubin Urine: NEGATIVE
Glucose, UA: NEGATIVE mg/dL
Ketones, ur: NEGATIVE mg/dL
Leukocytes,Ua: NEGATIVE
Nitrite: NEGATIVE
Protein, ur: >300 mg/dL — AB
Specific Gravity, Urine: 1.025 (ref 1.005–1.030)
pH: 6 (ref 5.0–8.0)

## 2019-12-26 NOTE — ED Triage Notes (Signed)
Pt arrives via EMS from home after he and his son noticed that he has not urinated in approximately 24 hours- pt lower abdomen distended and tender- pt has a hx of dementia and hypertension

## 2019-12-26 NOTE — ED Notes (Signed)
Daughter, [name], given update and discharge information reviewed- she states that the son will be coming to pick up pt

## 2019-12-26 NOTE — ED Notes (Signed)
Called number for granddaughter in chart who states she shares POA with the pt's daughter- informed her that pt was ready to be discharged- granddaughter states that she would get in touch with pt's daughter and they would discuss how to get pt home

## 2019-12-26 NOTE — ED Notes (Signed)
Bladder scan showed >427

## 2019-12-26 NOTE — ED Notes (Addendum)
Attempted to call daughter x3 with no answer and got a busy tone- no number listed in chart for pt son who he lives with and per first nurse son has not come to the front desk

## 2019-12-26 NOTE — Discharge Instructions (Addendum)
Follow-up with your primary care doctor for your blood pressure.  Call Dr. Glori Luis, the urologist, Monday morning to get an appointment to check on the catheter.  Wear the catheter and leg bag for at least a week.  That will help with the prostate shrink a little and may help you urinate by yourself again.  The urologist will be able to pull the catheter out for you and work on getting the prostate to go down.  Please return for any problems including fever or belly pain.

## 2019-12-26 NOTE — ED Notes (Signed)
Daughter, POA, given update

## 2019-12-26 NOTE — ED Provider Notes (Signed)
[facility] Emergency Department Provider Note   ____________________________________________   First MD Initiated Contact with Patient 12/26/19 2004     (approximate)  I have reviewed the triage vital signs and the nursing notes.   HISTORY  Chief Complaint Urinary Retention    HPI Dennis Serrano is a 86 y.o. male patient reports no urine today.  Lower abdomen is somewhat distended and tender.  He is never had this happen before.  He has no other medical problems he says except for hypertension.  See the below list of past medical history.  He does not have any dysuria currently.         Past Medical History:  Diagnosis Date  . Anxiety   . CAD (coronary artery disease)   . Dementia ([place])   . Diabetes mellitus without complication ([place])   . GERD (gastroesophageal reflux disease)   . History of heart artery stent   . HLD (hyperlipidemia)   . HTN (hypertension)   . Parkinson disease ([place])   . Prostate cancer ([place])    over 5 years ago    Patient Active Problem List   Diagnosis Date Noted  . Medication overdose 09/02/2018  . TIA (transient ischemic attack) 10/20/2016  . Pressure injury of skin 10/07/2016  . Sepsis ([place]) 10/02/2016  . CAP (community acquired pneumonia) 10/02/2016  . HTN (hypertension) 10/02/2016  . HLD (hyperlipidemia) 10/02/2016  . CAD (coronary artery disease) 10/02/2016  . GERD (gastroesophageal reflux disease) 10/02/2016    Past Surgical History:  Procedure Laterality Date  . APPENDECTOMY    . CORONARY STENT PLACEMENT    . EYE SURGERY    . TRANSURETHRAL RESECTION OF PROSTATE      Prior to Admission medications   Medication Sig Start Date End Date Taking? Authorizing Provider  acetaminophen (TYLENOL) 500 MG tablet Take 500 mg by mouth every 8 (eight) hours.    [provider]  albuterol (PROVENTIL HFA;VENTOLIN HFA) 108 (90 Base) MCG/ACT inhaler Inhale 2 puffs into the lungs every 6 (six) hours as needed  for wheezing or shortness of breath. 10/07/16   [name], MD  aspirin 325 MG tablet Take 1 tablet (325 mg total) by mouth daily. Patient taking differently: Take 81 mg by mouth daily.  10/08/16   [name], MD  carbidopa-levodopa (SINEMET IR) 25-100 MG tablet Take 1 tablet by mouth 3 (three) times daily.    [provider]  cholecalciferol (VITAMIN D) 25 MCG (1000 UT) tablet Take 2,000 Units by mouth daily.    [provider]  cloNIDine (CATAPRES) 0.1 MG tablet Take 0.1 mg by mouth 2 (two) times daily. 08/14/18   [provider]  diclofenac sodium (VOLTAREN) 1 % GEL Apply 2 g topically 4 (four) times daily.    [provider]  diltiazem (CARDIZEM CD) 180 MG 24 hr capsule Take 1 capsule (180 mg total) by mouth daily. 09/06/18   Dustin Flock, MD  docusate sodium (COLACE) 100 MG capsule Take 100 mg by mouth 2 (two) times daily.    [provider]  furosemide (LASIX) 20 MG tablet Take 20 mg by mouth daily. 08/20/18   [provider]  gemfibrozil (LOPID) 600 MG tablet Take 600 mg by mouth 2 (two) times daily. 08/20/18   [provider]  hydrALAZINE (APRESOLINE) 100 MG tablet Take 100 mg by mouth 4 (four) times daily. 08/20/18   [provider]  insulin detemir (LEVEMIR) 100 UNIT/ML injection Inject 0.12 mLs (12 Units total)  into the skin at bedtime. Patient taking differently: Inject 39 Units into the skin every morning.  10/07/16   [name], MD  lisinopril (PRINIVIL,ZESTRIL) 40 MG tablet Take 40 mg by mouth daily.    [provider]  Magnesium Gluconate 500 (27 Mg) MG TABS Take 1,000 mg by mouth 3 (three) times daily.    [provider]  magnesium hydroxide (MILK OF MAGNESIA) 400 MG/5ML suspension Take 30 mLs by mouth daily as needed for mild constipation.    [provider]  ondansetron (ZOFRAN ODT) 4 MG disintegrating tablet Take 1 tablet (4 mg total) by mouth every 8  (eight) hours as needed for nausea or vomiting. Patient not taking: Reported on 09/02/2018 08/19/17   [name], MD  ondansetron (ZOFRAN ODT) 4 MG disintegrating tablet Take 1 tablet (4 mg total) by mouth every 8 (eight) hours as needed for nausea or vomiting. 09/22/18   Merlyn Lot, MD  potassium chloride (K-DUR) 10 MEQ tablet Take 2 tablets (20 mEq total) by mouth daily for 3 days. 09/22/18 09/25/18  Merlyn Lot, MD  potassium chloride (K-DUR,KLOR-CON) 10 MEQ tablet Take 1 tablet (10 mEq total) by mouth 2 (two) times daily. 08/19/17   [name], MD    Allergies Patient has no known allergies.  Family History  Problem Relation Age of Onset  . Heart disease Mother   . Heart disease Father   . Cancer Sister   . Cancer Brother     Social History Social History   Tobacco Use  . Smoking status: Former Smoker    Types: Cigarettes    Quit date: 1980    Years since quitting: 41.5  . Smokeless tobacco: Never Used  Substance Use Topics  . Alcohol use: No  . Drug use: No    Review of Systems  Constitutional: No fever/chills Eyes: No visual changes. ENT: No sore throat. Cardiovascular: Denies chest pain. Respiratory: Denies shortness of breath. Gastrointestinal: No abdominal pain except for in the lower abdomen where the bladder feels  enlarged.  No nausea, no vomiting.  No diarrhea.  No constipation. Genitourinary: Negative for dysuria. Musculoskeletal: Negative for back pain. Skin: Negative for rash. Neurological: Negative for headaches, focal weakness   ____________________________________________   PHYSICAL EXAM:  VITAL SIGNS: ED Triage Vitals  Enc Vitals Group     BP 12/26/19 2000 (!) 193/77     Pulse Rate 12/26/19 2000 68     Resp 12/26/19 2000 16     Temp 12/26/19 2000 98.1 F (36.7 C)     Temp Source 12/26/19 2000 Oral     SpO2 12/26/19 2000 99 %     Weight 12/26/19 1957 148 lb 9.4 oz (67.4 kg)     Height 12/26/19 1957 5\' 9"  (1.753 m)       Head Circumference --      Peak Flow --      Pain Score 12/26/19 1956 0     Pain Loc --      Pain Edu? --      Excl. in [place]? --     Constitutional: Alert and oriented. Well appearing and in no acute distress. Eyes: Conjunctivae are normal.  Head: Atraumatic. Nose: No congestion/rhinnorhea. Mouth/Throat: Mucous membranes are moist.  Oropharynx non-erythematous. Neck: No stridor. Cardiovascular: Normal rate, regular rhythm. Grossly normal heart sounds.  Good peripheral circulation. Respiratory: Normal respiratory effort.  No retractions. Lungs CTAB. Gastrointestinal: Soft and nontender except for in the lower abdomen. No distention except for in the  lower abdomen. No abdominal bruits. Musculoskeletal: No leg pain or edema Neurologic:  Normal speech and language. No gross focal neurologic deficits are appreciated. N Skin:  Skin is warm, dry and intact.  ____________________________________________   LABS (all labs ordered are listed, but only abnormal results are displayed)  Labs Reviewed  BASIC METABOLIC PANEL - Abnormal; Notable for the following components:      Result Value   Sodium 131 (*)    Chloride 97 (*)    CO2 20 (*)    Glucose, Bld 245 (*)    BUN 27 (*)    All other components within normal limits  URINALYSIS, COMPLETE (UACMP) WITH MICROSCOPIC - Abnormal; Notable for the following components:   Hgb urine dipstick TRACE (*)    Protein, ur >300 (*)    All other components within normal limits  CBC WITH DIFFERENTIAL/PLATELET - Abnormal; Notable for the following components:   RBC 3.39 (*)    Hemoglobin 10.2 (*)    HCT 29.5 (*)    All other components within normal limits   ____________________________________________  EKG   ____________________________________________  RADIOLOGY  ED MD interpretation:   Official radiology report(s): No results found.  ____________________________________________   PROCEDURES  Procedure(s) performed (including  Critical Care):  Procedures   ____________________________________________   INITIAL IMPRESSION / ASSESSMENT AND PLAN / ED COURSE  Patient's blood pressure somewhat elevated.  We will have to get him to follow-up with his primary care doctor for that and will have him follow-up with urology for his urinary obstruction.  We will let him wear the Foley and leg bag until then.  He is feeling better now.  We will let him go.           ED Discharge Orders    None       Note:  This document was prepared using Dragon voice recognition software and may include unintentional dictation errors.    Nena Polio, MD 12/26/19 2123

## 2020-01-03 DIAGNOSIS — I2510 Atherosclerotic heart disease of native coronary artery without angina pectoris: Secondary | ICD-10-CM | POA: Diagnosis not present

## 2020-01-03 DIAGNOSIS — I10 Essential (primary) hypertension: Secondary | ICD-10-CM | POA: Diagnosis not present

## 2020-01-03 DIAGNOSIS — J439 Emphysema, unspecified: Secondary | ICD-10-CM | POA: Diagnosis not present

## 2020-01-03 DIAGNOSIS — Z466 Encounter for fitting and adjustment of urinary device: Secondary | ICD-10-CM | POA: Diagnosis not present

## 2020-01-03 DIAGNOSIS — G20 Parkinson's disease: Secondary | ICD-10-CM | POA: Diagnosis not present

## 2020-01-03 DIAGNOSIS — R339 Retention of urine, unspecified: Secondary | ICD-10-CM | POA: Diagnosis not present

## 2020-01-03 DIAGNOSIS — E7849 Other hyperlipidemia: Secondary | ICD-10-CM | POA: Diagnosis not present

## 2020-01-03 DIAGNOSIS — Z6822 Body mass index (BMI) 22.0-22.9, adult: Secondary | ICD-10-CM | POA: Diagnosis not present

## 2020-01-03 DIAGNOSIS — E119 Type 2 diabetes mellitus without complications: Secondary | ICD-10-CM | POA: Diagnosis not present

## 2020-01-03 DIAGNOSIS — Z7982 Long term (current) use of aspirin: Secondary | ICD-10-CM | POA: Diagnosis not present

## 2020-01-06 DIAGNOSIS — Z794 Long term (current) use of insulin: Secondary | ICD-10-CM | POA: Diagnosis not present

## 2020-01-06 DIAGNOSIS — K219 Gastro-esophageal reflux disease without esophagitis: Secondary | ICD-10-CM | POA: Diagnosis not present

## 2020-01-06 DIAGNOSIS — J439 Emphysema, unspecified: Secondary | ICD-10-CM | POA: Diagnosis not present

## 2020-01-06 DIAGNOSIS — G20 Parkinson's disease: Secondary | ICD-10-CM | POA: Diagnosis not present

## 2020-01-06 DIAGNOSIS — E119 Type 2 diabetes mellitus without complications: Secondary | ICD-10-CM | POA: Diagnosis not present

## 2020-01-06 DIAGNOSIS — I2510 Atherosclerotic heart disease of native coronary artery without angina pectoris: Secondary | ICD-10-CM | POA: Diagnosis not present

## 2020-01-06 DIAGNOSIS — G2581 Restless legs syndrome: Secondary | ICD-10-CM | POA: Diagnosis not present

## 2020-01-06 DIAGNOSIS — E785 Hyperlipidemia, unspecified: Secondary | ICD-10-CM | POA: Diagnosis not present

## 2020-01-06 DIAGNOSIS — R319 Hematuria, unspecified: Secondary | ICD-10-CM | POA: Diagnosis not present

## 2020-01-13 ENCOUNTER — Emergency Department
Admission: EM | Admit: 2020-01-13 | Discharge: 2020-01-13 | Disposition: A | Discharge status: Home / Self Care | Payer: Medicare HMO | Attending: Emergency Medicine | Admitting: Emergency Medicine

## 2020-01-13 ENCOUNTER — Emergency Department: Payer: Medicare HMO

## 2020-01-13 ENCOUNTER — Other Ambulatory Visit: Payer: Self-pay

## 2020-01-13 ENCOUNTER — Encounter: Payer: Self-pay | Admitting: Emergency Medicine

## 2020-01-13 DIAGNOSIS — K8020 Calculus of gallbladder without cholecystitis without obstruction: Secondary | ICD-10-CM | POA: Diagnosis not present

## 2020-01-13 DIAGNOSIS — Z951 Presence of aortocoronary bypass graft: Secondary | ICD-10-CM | POA: Insufficient documentation

## 2020-01-13 DIAGNOSIS — K5649 Other impaction of intestine: Secondary | ICD-10-CM

## 2020-01-13 DIAGNOSIS — K5900 Constipation, unspecified: Secondary | ICD-10-CM | POA: Diagnosis not present

## 2020-01-13 DIAGNOSIS — S199XXA Unspecified injury of neck, initial encounter: Secondary | ICD-10-CM | POA: Diagnosis not present

## 2020-01-13 DIAGNOSIS — Z8673 Personal history of transient ischemic attack (TIA), and cerebral infarction without residual deficits: Secondary | ICD-10-CM | POA: Insufficient documentation

## 2020-01-13 DIAGNOSIS — R112 Nausea with vomiting, unspecified: Secondary | ICD-10-CM | POA: Diagnosis not present

## 2020-01-13 DIAGNOSIS — Z87891 Personal history of nicotine dependence: Secondary | ICD-10-CM | POA: Insufficient documentation

## 2020-01-13 DIAGNOSIS — F0281 Dementia in other diseases classified elsewhere with behavioral disturbance: Secondary | ICD-10-CM | POA: Insufficient documentation

## 2020-01-13 DIAGNOSIS — E1165 Type 2 diabetes mellitus with hyperglycemia: Secondary | ICD-10-CM | POA: Diagnosis not present

## 2020-01-13 DIAGNOSIS — I10 Essential (primary) hypertension: Secondary | ICD-10-CM | POA: Insufficient documentation

## 2020-01-13 DIAGNOSIS — Z794 Long term (current) use of insulin: Secondary | ICD-10-CM | POA: Insufficient documentation

## 2020-01-13 DIAGNOSIS — I2510 Atherosclerotic heart disease of native coronary artery without angina pectoris: Secondary | ICD-10-CM | POA: Diagnosis not present

## 2020-01-13 DIAGNOSIS — R531 Weakness: Secondary | ICD-10-CM | POA: Diagnosis not present

## 2020-01-13 DIAGNOSIS — G20 Parkinson's disease: Secondary | ICD-10-CM | POA: Diagnosis not present

## 2020-01-13 DIAGNOSIS — K5641 Fecal impaction: Secondary | ICD-10-CM | POA: Insufficient documentation

## 2020-01-13 DIAGNOSIS — Z79899 Other long term (current) drug therapy: Secondary | ICD-10-CM | POA: Diagnosis not present

## 2020-01-13 DIAGNOSIS — G44309 Post-traumatic headache, unspecified, not intractable: Secondary | ICD-10-CM | POA: Diagnosis not present

## 2020-01-13 DIAGNOSIS — E119 Type 2 diabetes mellitus without complications: Secondary | ICD-10-CM | POA: Diagnosis not present

## 2020-01-13 DIAGNOSIS — N281 Cyst of kidney, acquired: Secondary | ICD-10-CM | POA: Diagnosis not present

## 2020-01-13 DIAGNOSIS — Z8546 Personal history of malignant neoplasm of prostate: Secondary | ICD-10-CM | POA: Diagnosis not present

## 2020-01-13 DIAGNOSIS — R0902 Hypoxemia: Secondary | ICD-10-CM | POA: Diagnosis not present

## 2020-01-13 DIAGNOSIS — I959 Hypotension, unspecified: Secondary | ICD-10-CM | POA: Diagnosis not present

## 2020-01-13 DIAGNOSIS — Z7982 Long term (current) use of aspirin: Secondary | ICD-10-CM | POA: Insufficient documentation

## 2020-01-13 DIAGNOSIS — N3289 Other specified disorders of bladder: Secondary | ICD-10-CM | POA: Diagnosis not present

## 2020-01-13 LAB — COMPREHENSIVE METABOLIC PANEL
ALT: 8 U/L (ref 0–44)
AST: 33 U/L (ref 15–41)
Albumin: 3.8 g/dL (ref 3.5–5.0)
Alkaline Phosphatase: 95 U/L (ref 38–126)
Anion gap: 9 (ref 5–15)
BUN: 30 mg/dL — ABNORMAL HIGH (ref 8–23)
CO2: 22 mmol/L (ref 22–32)
Calcium: 9.6 mg/dL (ref 8.9–10.3)
Chloride: 101 mmol/L (ref 98–111)
Creatinine, Ser: 1.15 mg/dL (ref 0.61–1.24)
GFR calc Af Amer: >60 mL/min (ref >60)
GFR calc non Af Amer: 58 mL/min — ABNORMAL LOW (ref >60)
Glucose, Bld: 185 mg/dL — ABNORMAL HIGH (ref 70–99)
Potassium: 4.5 mmol/L (ref 3.5–5.1)
Sodium: 132 mmol/L — ABNORMAL LOW (ref 135–145)
Total Bilirubin: 0.6 mg/dL (ref 0.3–1.2)
Total Protein: 7.8 g/dL (ref 6.5–8.1)

## 2020-01-13 LAB — CBC
HCT: 32.6 % — ABNORMAL LOW (ref 39.0–52.0)
Hemoglobin: 11.1 g/dL — ABNORMAL LOW (ref 13.0–17.0)
MCH: 29.8 pg (ref 26.0–34.0)
MCHC: 34 g/dL (ref 30.0–36.0)
MCV: 87.6 fL (ref 80.0–100.0)
Platelets: 437 10*3/uL — ABNORMAL HIGH (ref 150–400)
RBC: 3.72 MIL/uL — ABNORMAL LOW (ref 4.22–5.81)
RDW: 13 % (ref 11.5–15.5)
WBC: 8.8 10*3/uL (ref 4.0–10.5)
nRBC: 0 % (ref 0.0–0.2)

## 2020-01-13 LAB — LIPASE, BLOOD: Lipase: 171 U/L — ABNORMAL HIGH (ref 11–51)

## 2020-01-13 LAB — TROPONIN I (HIGH SENSITIVITY)
Troponin I (High Sensitivity): 13 ng/L (ref <18)
Troponin I (High Sensitivity): 9 ng/L (ref <18)

## 2020-01-13 LAB — GLUCOSE, CAPILLARY: Glucose-Capillary: 168 mg/dL — ABNORMAL HIGH (ref 70–99)

## 2020-01-13 MED ORDER — FLEET ENEMA 7-19 GM/118ML RE ENEM
1.0000 | ENEMA | Freq: Once | RECTAL | Status: AC
  Administered 2020-01-13: 1 via RECTAL

## 2020-01-13 MED ORDER — SODIUM CHLORIDE 0.9 % IV BOLUS
500.0000 mL | Freq: Once | INTRAVENOUS | Status: AC
  Administered 2020-01-13: 500 mL via INTRAVENOUS

## 2020-01-13 MED ORDER — ONDANSETRON HCL 4 MG/2ML IJ SOLN
4.0000 mg | Freq: Once | INTRAMUSCULAR | Status: AC
  Administered 2020-01-13: 4 mg via INTRAVENOUS
  Filled 2020-01-13: qty 2

## 2020-01-13 MED ORDER — SODIUM CHLORIDE 0.9% FLUSH: 3.0000 mL | Freq: Once | INTRAVENOUS | Status: DC

## 2020-01-13 MED ORDER — IOHEXOL 300 MG/ML  SOLN
100.0000 mL | Freq: Once | INTRAMUSCULAR | Status: AC | PRN
  Administered 2020-01-13: 100 mL via INTRAVENOUS

## 2020-01-13 MED ORDER — ACETAMINOPHEN 500 MG PO TABS
1000.0000 mg | ORAL_TABLET | Freq: Once | ORAL | Status: AC
  Administered 2020-01-13: 1000 mg via ORAL
  Filled 2020-01-13: qty 2

## 2020-01-13 NOTE — ED Notes (Signed)
Pt disimpacted by dr Jari Pigg.  Tolerated well.

## 2020-01-13 NOTE — ED Notes (Signed)
Family came to desk to speak about leaving. This RN spoke to family about waiting a little longer due to some changes in labs. Willing to stay.

## 2020-01-13 NOTE — ED Triage Notes (Signed)
Was to have a PCP appointment today, when granddaughter arrived patient was on floor, CBG:  245, and vomiting.  Live with son.;

## 2020-01-13 NOTE — Discharge Instructions (Addendum)
I suspect that some of his emesis was secondary to a large amount of stool that was blocking the transit.  CT scans are as below.  Patient is not tolerating eating and drinking.  Return to the ER if he develops any other new symptoms.  He can take a capful of MiraLAX daily to help with constipation   1. Large amount of stool is noted in the rectum consistent with impaction. 2. Cholelithiasis. 3. Bilateral renal cysts are noted. These are grossly stable, including the cyst with peripheral calcifications involving the right kidney. These can be considered benign at this point. 4. Urinary bladder is decompressed secondary to Foley catheter. Wall thickening is noted which may simply represent nondistention, but cystitis cannot be excluded.

## 2020-01-13 NOTE — ED Notes (Signed)
Rainbow was sent to lab. 

## 2020-01-13 NOTE — ED Notes (Signed)
Legal guardian lori meeks-daughter aware of discharge.

## 2020-01-13 NOTE — ED Notes (Signed)
Good results with enema.   md aware  Pt drinking ginger ale

## 2020-01-13 NOTE — ED Provider Notes (Signed)
[facility] Emergency Department Provider Note  ____________________________________________   First MD Initiated Contact with Patient 01/13/20 1257     (approximate)  I have reviewed the triage vital signs and the nursing notes.   HISTORY  Chief Complaint Emesis    HPI Dennis Serrano is a 86 y.o. male with dementia, coronary disease, diabetes, hypertension, hyperlipidemia who comes in for emesis.  Patient was suppose to have a PCP appointment today with granddaughter due to have possible catheter removed but  the patient was on the floor and was vomiting.  Glucose was 235.  Daughter reports that he has had some history of vomiting in the past and they have Zofran at home but this was not given to him.  The patient lives with his son but he did not witness him fall onto the ground.  He really denies any concerns at this time.  However full HPI unable to be obtained due to patient's baseline dementia.          Past Medical History:  Diagnosis Date  . Anxiety   . CAD (coronary artery disease)   . Dementia ([place])   . Diabetes mellitus without complication ([place])   . GERD (gastroesophageal reflux disease)   . History of heart artery stent   . HLD (hyperlipidemia)   . HTN (hypertension)   . Parkinson disease ([place])   . Prostate cancer ([place])    over 5 years ago    Patient Active Problem List   Diagnosis Date Noted  . Medication overdose 09/02/2018  . TIA (transient ischemic attack) 10/20/2016  . Pressure injury of skin 10/07/2016  . Sepsis ([place]) 10/02/2016  . CAP (community acquired pneumonia) 10/02/2016  . HTN (hypertension) 10/02/2016  . HLD (hyperlipidemia) 10/02/2016  . CAD (coronary artery disease) 10/02/2016  . GERD (gastroesophageal reflux disease) 10/02/2016    Past Surgical History:  Procedure Laterality Date  . APPENDECTOMY    . CORONARY STENT PLACEMENT    . EYE SURGERY    . TRANSURETHRAL RESECTION OF PROSTATE      Prior to  Admission medications   Medication Sig Start Date End Date Taking? Authorizing Provider  acetaminophen (TYLENOL) 500 MG tablet Take 500 mg by mouth every 8 (eight) hours.    [provider]  albuterol (PROVENTIL HFA;VENTOLIN HFA) 108 (90 Base) MCG/ACT inhaler Inhale 2 puffs into the lungs every 6 (six) hours as needed for wheezing or shortness of breath. 10/07/16   [name], MD  aspirin 325 MG tablet Take 1 tablet (325 mg total) by mouth daily. Patient taking differently: Take 81 mg by mouth daily.  10/08/16   [name], MD  carbidopa-levodopa (SINEMET IR) 25-100 MG tablet Take 1 tablet by mouth 3 (three) times daily.    [provider]  cholecalciferol (VITAMIN D) 25 MCG (1000 UT) tablet Take 2,000 Units by mouth daily.    [provider]  cloNIDine (CATAPRES) 0.1 MG tablet Take 0.1 mg by mouth 2 (two) times daily. 08/14/18   [provider]  diclofenac sodium (VOLTAREN) 1 % GEL Apply 2 g topically 4 (four) times daily.    [provider]  diltiazem (CARDIZEM CD) 180 MG 24 hr capsule Take 1 capsule (180 mg total) by mouth daily. 09/06/18   Dustin Flock, MD  docusate sodium (COLACE) 100 MG capsule Take 100 mg by mouth 2 (two) times daily.    [provider]  furosemide (LASIX) 20 MG tablet Take 20 mg by mouth daily. 08/20/18  [provider]  gemfibrozil (LOPID) 600 MG tablet Take 600 mg by mouth 2 (two) times daily. 08/20/18   [provider]  hydrALAZINE (APRESOLINE) 100 MG tablet Take 100 mg by mouth 4 (four) times daily. 08/20/18   [provider]  insulin detemir (LEVEMIR) 100 UNIT/ML injection Inject 0.12 mLs (12 Units total) into the skin at bedtime. Patient taking differently: Inject 39 Units into the skin every morning.  10/07/16   [name], MD  lisinopril (PRINIVIL,ZESTRIL) 40 MG tablet Take 40 mg by mouth daily.    [provider]  Magnesium Gluconate 500 (27 Mg) MG  TABS Take 1,000 mg by mouth 3 (three) times daily.    [provider]  magnesium hydroxide (MILK OF MAGNESIA) 400 MG/5ML suspension Take 30 mLs by mouth daily as needed for mild constipation.    [provider]  ondansetron (ZOFRAN ODT) 4 MG disintegrating tablet Take 1 tablet (4 mg total) by mouth every 8 (eight) hours as needed for nausea or vomiting. Patient not taking: Reported on 09/02/2018 08/19/17   [name], MD  ondansetron (ZOFRAN ODT) 4 MG disintegrating tablet Take 1 tablet (4 mg total) by mouth every 8 (eight) hours as needed for nausea or vomiting. 09/22/18   Merlyn Lot, MD  potassium chloride (K-DUR) 10 MEQ tablet Take 2 tablets (20 mEq total) by mouth daily for 3 days. 09/22/18 09/25/18  Merlyn Lot, MD  potassium chloride (K-DUR,KLOR-CON) 10 MEQ tablet Take 1 tablet (10 mEq total) by mouth 2 (two) times daily. 08/19/17   [name], MD    Allergies Patient has no known allergies.  Family History  Problem Relation Age of Onset  . Heart disease Mother   . Heart disease Father   . Cancer Sister   . Cancer Brother     Social History Social History   Tobacco Use  . Smoking status: Former Smoker    Types: Cigarettes    Quit date: 1980    Years since quitting: 41.5  . Smokeless tobacco: Never Used  Substance Use Topics  . Alcohol use: No  . Drug use: No      Review of Systems Constitutional: No fever/chills, found down on the ground Eyes: No visual changes. ENT: No sore throat. Cardiovascular: Denies chest pain. Respiratory: Denies shortness of breath. Gastrointestinal: No abdominal pain.  No nausea, positive vomiting no diarrhea.  No constipation. Genitourinary: Negative for dysuria. Musculoskeletal: Negative for back pain. Skin: Negative for rash. Neurological: Negative for headaches, focal weakness or numbness. All other ROS negative ____________________________________________   PHYSICAL EXAM:  VITAL  SIGNS: ED Triage Vitals  Enc Vitals Group     BP 01/13/20 0938 (!) 176/73     Pulse Rate 01/13/20 0938 80     Resp 01/13/20 0938 16     Temp 01/13/20 0938 97.9 F (36.6 C)     Temp Source 01/13/20 0938 Oral     SpO2 01/13/20 0938 100 %     Weight 01/13/20 0937 148 lb 9.4 oz (67.4 kg)     Height 01/13/20 0937 5\' 9"  (1.753 m)     Head Circumference --      Peak Flow --      Pain Score --      Pain Loc --      Pain Edu? --      Excl. in [place]? --     Constitutional: Alert and oriented. Well appearing and in no acute distress. Eyes: Conjunctivae are normal. EOMI. Head:  Atraumatic. Nose: No congestion/rhinnorhea. Mouth/Throat: Mucous membranes are moist.   Neck: No stridor. Trachea Midline. FROM Cardiovascular: Normal rate, regular rhythm. Grossly normal heart sounds.  Good peripheral circulation. Respiratory: Normal respiratory effort.  No retractions. Lungs CTAB. Gastrointestinal: Soft and nontender. No distention. No abdominal bruits.  Yellow vomit in bag Musculoskeletal: No lower extremity tenderness nor edema.  No joint effusions.  Able to lift both legs up off the bed.  No tenderness in the arms or legs Neurologic:  Normal speech and language. No gross focal neurologic deficits are appreciated.  Equal strength in arms and legs Skin:  Skin is warm, dry and intact. No rash noted. Psychiatric: Mood and affect are normal. Speech and behavior are normal. GU: Deferred   ____________________________________________   LABS (all labs ordered are listed, but only abnormal results are displayed)  Labs Reviewed  GLUCOSE, CAPILLARY - Abnormal; Notable for the following components:      Result Value   Glucose-Capillary 168 (*)    All other components within normal limits  LIPASE, BLOOD - Abnormal; Notable for the following components:   Lipase 171 (*)    All other components within normal limits  COMPREHENSIVE METABOLIC PANEL - Abnormal; Notable for the following components:   Sodium  132 (*)    Glucose, Bld 185 (*)    BUN 30 (*)    GFR calc non Af Amer 58 (*)    All other components within normal limits  CBC - Abnormal; Notable for the following components:   RBC 3.72 (*)    Hemoglobin 11.1 (*)    HCT 32.6 (*)    Platelets 437 (*)    All other components within normal limits  URINALYSIS, COMPLETE (UACMP) WITH MICROSCOPIC   ____________________________________________   ED ECG REPORT I, Vanessa Media, the attending physician, personally viewed and interpreted this ECG.  Sinus rate of 80, no ST elevation, no T wave inversions, first-degree AV block, incomplete right bundle branch block, left anterior fascicular block ____________________________________________  RADIOLOGY   Official radiology report(s): CT Head Wo Contrast  Result Date: 01/13/2020 CLINICAL DATA:  Posttraumatic headache after unwitnessed fall. EXAM: CT HEAD WITHOUT CONTRAST TECHNIQUE: Contiguous axial images were obtained from the base of the skull through the vertex without intravenous contrast. COMPARISON:  October 20, 2016. FINDINGS: Brain: Mild diffuse cortical atrophy is noted. Mild chronic ischemic white matter disease is noted. No mass effect or midline shift is noted. Ventricular size is within normal limits. There is no evidence of mass lesion, hemorrhage or acute infarction. Vascular: No hyperdense vessel or unexpected calcification. Skull: Normal. Negative for fracture or focal lesion. Sinuses/Orbits: No acute finding. Other: None. IMPRESSION: Mild diffuse cortical atrophy. Mild chronic ischemic white matter disease. No acute intracranial abnormality seen. Electronically Signed   By: Marijo Conception M.D.   On: 01/13/2020 14:10   CT Cervical Spine Wo Contrast  Result Date: 01/13/2020 CLINICAL DATA:  Poly trauma, critical, head/cervical spine injury suspected. Additional provided: Patient found down. EXAM: CT CERVICAL SPINE WITHOUT CONTRAST TECHNIQUE: Multidetector CT imaging of the cervical  spine was performed without intravenous contrast. Multiplanar CT image reconstructions were also generated. COMPARISON:  No pertinent prior studies available for comparison. FINDINGS: Alignment: Trace C5-C6 and C7-T1 grade 1 anterolisthesis. Skull base and vertebrae: The basion-dental and atlanto-dental intervals are maintained.No evidence of acute fracture to the cervical spine. Soft tissues and spinal canal: No prevertebral fluid or swelling. No visible canal hematoma. Disc levels: Congenital fusion across the disc spaces and  facet joints at C2-C3. cervical spondylosis. Most notably at C3-C4, there is advanced disc space narrowing with a posterior disc osteophyte complex and uncovertebral hypertrophy. Multilevel facet hypertrophy. No high-grade bony spinal canal stenosis Upper chest: Severe emphysema within the imaged lung apices. No visible pneumothorax. Other: Calcified plaque within the proximal major branch vessels of the neck and carotid arteries. IMPRESSION: No evidence of acute fracture to the cervical spine. Mild C5-C6 and C7-T1 grade 1 anterolisthesis. Congenital C2-C3 fusion. Cervical spondylosis as described and greatest at C3-C4. Electronically Signed   By: Kellie Simmering DO   On: 01/13/2020 14:13   1. Large amount of stool is noted in the rectum consistent with impaction. 2. Cholelithiasis. 3. Bilateral renal cysts are noted. These are grossly stable, including the cyst with peripheral calcifications involving the right kidney. These can be considered benign at this point. 4. Urinary bladder is decompressed secondary to Foley catheter. Wall thickening is noted which may simply represent nondistention, but cystitis cannot be excluded.  ____________________________________________   PROCEDURES  Procedure(s) performed (including Critical Care):  Procedures  ------------------------------------------------------------------------------------------------------------------- Fecal  Disimpaction Procedure Note:  Performed by me:  Patient placed in the lateral recumbent position with knees drawn towards chest. Nurse present for patient support. Small  amount of sift brown stool removed. No complications during procedure.   ------------------------------------------------------------------------------------------------------------------   ____________________________________________   INITIAL IMPRESSION / ASSESSMENT AND PLAN / ED COURSE  Dennis Serrano was evaluated in Emergency Department on 01/13/2020 for the symptoms described in the history of present illness. He was evaluated in the context of the global COVID-19 pandemic, which necessitated consideration that the patient might be at risk for infection with the SARS-CoV-2 virus that causes COVID-19. Institutional protocols and algorithms that pertain to the evaluation of patients at risk for COVID-19 are in a state of rapid change based on information released by regulatory bodies including the CDC and federal and state organizations. These policies and algorithms were followed during the patient's care in the ED.    Patient is an 86 year old who comes in with vomiting and being found down on the ground.  Unclear if he had a fall.  Will get CT head to make sure no evidence of intracranial hemorrhage, CT cervical evaluate for cervical fracture given his limited history.  No chest pain or chest wall pain to suggest rib fractures, PE.  Patient does not only have any abdominal pain but given the vomiting and elevated lipase I think be best proceed with a CT scan to make sure no evidence of obstruction, pancreatitis, pancreatic mass.  Labs ordered evaluate for Electra abnormalities, AKI.  Patient is unable to report any chest pain but will get EKG cardiac marker.  We will give patient some fluid, Zofran while waiting CT imaging  Lipase elevated at 171 Hemoglobin at baseline 11.1  CT scan does show large amount of stool in the  rectum.  Rectal exam was performed and there was only small amount of stool that I was able to reach.  Therefore I discussed with the daughter I think be best to proceed with enema to make sure that patient does not have recurrent vomiting.  He does have stones in his gallbladder but he had no right upper quadrant tenderness no LFT elevation to suggest gallbladder pathology.  His lipase was slightly elevated but that can be nonspecific and he is having no pain to suggest pancreatitis.  His CT scan does not show signs of pancreatitis.  His CT scan does show decompressed  bladder secondary to the Foley.  Cannot exclude cystitis but I have very low suspicion given no fever, no white count.  If we  took a sample off the catheter it which is look colonized therefore discussed with the daughter given no other symptoms of UTI we will hold off on catheter exchange for urine sample.  He is planning on having his catheter removed next few days regardless.  Repeat cardiac marker is negative.  5:18 PM patient was able to have a large bowel movement after enema.    6:09 PM patient is tolerating eating a hamburger.  This time family feels comfortable going home.  I discussed the provisional nature of ED diagnosis, the treatment so far, the ongoing plan of care, follow up appointments and return precautions with the patient and any family or support people present. They expressed understanding and agreed with the plan, discharged home.   ____________________________________________   FINAL CLINICAL IMPRESSION(S) / ED DIAGNOSES   Final diagnoses:  Nausea and vomiting, intractability of vomiting not specified, unspecified vomiting type  Impaction of intestine (HCC)  Constipation, unspecified constipation type      MEDICATIONS GIVEN DURING THIS VISIT:  Medications  sodium chloride flush (NS) 0.9 % injection 3 mL (3 mLs Intravenous Not Given 01/13/20 1313)  sodium chloride 0.9 % bolus 500 mL (0 mLs  Intravenous Stopped 01/13/20 1435)  ondansetron (ZOFRAN) injection 4 mg (4 mg Intravenous Given 01/13/20 1348)  iohexol (OMNIPAQUE) 300 MG/ML solution 100 mL (100 mLs Intravenous Contrast Given 01/13/20 1325)  acetaminophen (TYLENOL) tablet 1,000 mg (1,000 mg Oral Given 01/13/20 1451)  sodium phosphate (FLEET) 7-19 GM/118ML enema 1 enema (1 enema Rectal Given 01/13/20 1617)     ED Discharge Orders    None       Note:  This document was prepared using Dragon voice recognition software and may include unintentional dictation errors.   Vanessa Brownlee, MD 01/13/20 [phone]

## 2020-01-20 DIAGNOSIS — E785 Hyperlipidemia, unspecified: Secondary | ICD-10-CM | POA: Diagnosis not present

## 2020-01-20 DIAGNOSIS — N390 Urinary tract infection, site not specified: Secondary | ICD-10-CM | POA: Diagnosis not present

## 2020-01-20 DIAGNOSIS — N3001 Acute cystitis with hematuria: Secondary | ICD-10-CM | POA: Diagnosis not present

## 2020-01-20 DIAGNOSIS — J439 Emphysema, unspecified: Secondary | ICD-10-CM | POA: Diagnosis not present

## 2020-01-20 DIAGNOSIS — R338 Other retention of urine: Secondary | ICD-10-CM | POA: Diagnosis not present

## 2020-01-20 DIAGNOSIS — R339 Retention of urine, unspecified: Secondary | ICD-10-CM | POA: Diagnosis not present

## 2020-01-20 DIAGNOSIS — F419 Anxiety disorder, unspecified: Secondary | ICD-10-CM | POA: Diagnosis not present

## 2020-01-20 DIAGNOSIS — G20 Parkinson's disease: Secondary | ICD-10-CM | POA: Diagnosis not present

## 2020-01-20 DIAGNOSIS — Z87891 Personal history of nicotine dependence: Secondary | ICD-10-CM | POA: Diagnosis not present

## 2020-01-20 DIAGNOSIS — N401 Enlarged prostate with lower urinary tract symptoms: Secondary | ICD-10-CM | POA: Diagnosis not present

## 2020-01-20 DIAGNOSIS — R310 Gross hematuria: Secondary | ICD-10-CM | POA: Diagnosis not present

## 2020-01-20 DIAGNOSIS — E119 Type 2 diabetes mellitus without complications: Secondary | ICD-10-CM | POA: Diagnosis not present

## 2020-02-04 DIAGNOSIS — R339 Retention of urine, unspecified: Secondary | ICD-10-CM | POA: Diagnosis not present

## 2020-02-17 DIAGNOSIS — R4182 Altered mental status, unspecified: Secondary | ICD-10-CM | POA: Diagnosis not present

## 2020-02-17 DIAGNOSIS — F0280 Dementia in other diseases classified elsewhere without behavioral disturbance: Secondary | ICD-10-CM | POA: Diagnosis not present

## 2020-02-17 DIAGNOSIS — R509 Fever, unspecified: Secondary | ICD-10-CM | POA: Diagnosis not present

## 2020-02-17 DIAGNOSIS — Z9181 History of falling: Secondary | ICD-10-CM | POA: Diagnosis not present

## 2020-02-17 DIAGNOSIS — N390 Urinary tract infection, site not specified: Secondary | ICD-10-CM | POA: Diagnosis not present

## 2020-02-17 DIAGNOSIS — G319 Degenerative disease of nervous system, unspecified: Secondary | ICD-10-CM | POA: Diagnosis not present

## 2020-02-17 DIAGNOSIS — E119 Type 2 diabetes mellitus without complications: Secondary | ICD-10-CM | POA: Diagnosis not present

## 2020-02-17 DIAGNOSIS — T83098A Other mechanical complication of other indwelling urethral catheter, initial encounter: Secondary | ICD-10-CM | POA: Diagnosis not present

## 2020-02-17 DIAGNOSIS — I6782 Cerebral ischemia: Secondary | ICD-10-CM | POA: Diagnosis not present

## 2020-02-17 DIAGNOSIS — Z20822 Contact with and (suspected) exposure to covid-19: Secondary | ICD-10-CM | POA: Diagnosis not present

## 2020-02-17 DIAGNOSIS — R2681 Unsteadiness on feet: Secondary | ICD-10-CM | POA: Diagnosis not present

## 2020-02-17 DIAGNOSIS — I2510 Atherosclerotic heart disease of native coronary artery without angina pectoris: Secondary | ICD-10-CM | POA: Diagnosis not present

## 2020-02-17 DIAGNOSIS — R310 Gross hematuria: Secondary | ICD-10-CM | POA: Diagnosis not present

## 2020-02-17 DIAGNOSIS — Z6822 Body mass index (BMI) 22.0-22.9, adult: Secondary | ICD-10-CM | POA: Diagnosis not present

## 2020-02-17 DIAGNOSIS — I10 Essential (primary) hypertension: Secondary | ICD-10-CM | POA: Diagnosis not present

## 2020-02-17 DIAGNOSIS — E876 Hypokalemia: Secondary | ICD-10-CM | POA: Diagnosis not present

## 2020-02-18 DIAGNOSIS — N390 Urinary tract infection, site not specified: Secondary | ICD-10-CM | POA: Diagnosis not present

## 2020-02-18 DIAGNOSIS — F0390 Unspecified dementia without behavioral disturbance: Secondary | ICD-10-CM | POA: Diagnosis not present

## 2020-02-18 DIAGNOSIS — R339 Retention of urine, unspecified: Secondary | ICD-10-CM | POA: Diagnosis not present

## 2020-02-18 DIAGNOSIS — I10 Essential (primary) hypertension: Secondary | ICD-10-CM | POA: Diagnosis not present

## 2020-02-19 DIAGNOSIS — E876 Hypokalemia: Secondary | ICD-10-CM | POA: Diagnosis not present

## 2020-02-19 DIAGNOSIS — F0390 Unspecified dementia without behavioral disturbance: Secondary | ICD-10-CM | POA: Diagnosis not present

## 2020-02-19 DIAGNOSIS — R339 Retention of urine, unspecified: Secondary | ICD-10-CM | POA: Diagnosis not present

## 2020-02-19 DIAGNOSIS — N390 Urinary tract infection, site not specified: Secondary | ICD-10-CM | POA: Diagnosis not present

## 2020-02-21 DIAGNOSIS — F0280 Dementia in other diseases classified elsewhere without behavioral disturbance: Secondary | ICD-10-CM | POA: Diagnosis not present

## 2020-02-21 DIAGNOSIS — R339 Retention of urine, unspecified: Secondary | ICD-10-CM | POA: Diagnosis not present

## 2020-02-21 DIAGNOSIS — B9689 Other specified bacterial agents as the cause of diseases classified elsewhere: Secondary | ICD-10-CM | POA: Diagnosis not present

## 2020-02-21 DIAGNOSIS — N139 Obstructive and reflux uropathy, unspecified: Secondary | ICD-10-CM | POA: Diagnosis not present

## 2020-02-21 DIAGNOSIS — C61 Malignant neoplasm of prostate: Secondary | ICD-10-CM | POA: Diagnosis not present

## 2020-02-21 DIAGNOSIS — N390 Urinary tract infection, site not specified: Secondary | ICD-10-CM | POA: Diagnosis not present

## 2020-02-21 DIAGNOSIS — G20 Parkinson's disease: Secondary | ICD-10-CM | POA: Diagnosis not present

## 2020-02-21 DIAGNOSIS — B961 Klebsiella pneumoniae [K. pneumoniae] as the cause of diseases classified elsewhere: Secondary | ICD-10-CM | POA: Diagnosis not present

## 2020-02-21 DIAGNOSIS — Z466 Encounter for fitting and adjustment of urinary device: Secondary | ICD-10-CM | POA: Diagnosis not present

## 2020-02-25 DIAGNOSIS — F0390 Unspecified dementia without behavioral disturbance: Secondary | ICD-10-CM | POA: Diagnosis not present

## 2020-02-25 DIAGNOSIS — N390 Urinary tract infection, site not specified: Secondary | ICD-10-CM | POA: Diagnosis not present

## 2020-02-25 DIAGNOSIS — E119 Type 2 diabetes mellitus without complications: Secondary | ICD-10-CM | POA: Diagnosis not present

## 2020-02-25 DIAGNOSIS — C61 Malignant neoplasm of prostate: Secondary | ICD-10-CM | POA: Diagnosis not present

## 2020-02-25 DIAGNOSIS — R310 Gross hematuria: Secondary | ICD-10-CM | POA: Diagnosis not present

## 2020-02-25 DIAGNOSIS — J439 Emphysema, unspecified: Secondary | ICD-10-CM | POA: Diagnosis not present

## 2020-02-25 DIAGNOSIS — N139 Obstructive and reflux uropathy, unspecified: Secondary | ICD-10-CM | POA: Diagnosis not present

## 2020-02-25 DIAGNOSIS — Z466 Encounter for fitting and adjustment of urinary device: Secondary | ICD-10-CM | POA: Diagnosis not present

## 2020-02-25 DIAGNOSIS — R339 Retention of urine, unspecified: Secondary | ICD-10-CM | POA: Diagnosis not present

## 2020-02-25 DIAGNOSIS — F0280 Dementia in other diseases classified elsewhere without behavioral disturbance: Secondary | ICD-10-CM | POA: Diagnosis not present

## 2020-02-25 DIAGNOSIS — G20 Parkinson's disease: Secondary | ICD-10-CM | POA: Diagnosis not present

## 2020-02-25 DIAGNOSIS — I2510 Atherosclerotic heart disease of native coronary artery without angina pectoris: Secondary | ICD-10-CM | POA: Diagnosis not present

## 2020-02-25 DIAGNOSIS — B9689 Other specified bacterial agents as the cause of diseases classified elsewhere: Secondary | ICD-10-CM | POA: Diagnosis not present

## 2020-02-25 DIAGNOSIS — I10 Essential (primary) hypertension: Secondary | ICD-10-CM | POA: Diagnosis not present

## 2020-02-25 DIAGNOSIS — B961 Klebsiella pneumoniae [K. pneumoniae] as the cause of diseases classified elsewhere: Secondary | ICD-10-CM | POA: Diagnosis not present

## 2020-02-26 DIAGNOSIS — Z466 Encounter for fitting and adjustment of urinary device: Secondary | ICD-10-CM | POA: Diagnosis not present

## 2020-02-26 DIAGNOSIS — N390 Urinary tract infection, site not specified: Secondary | ICD-10-CM | POA: Diagnosis not present

## 2020-02-26 DIAGNOSIS — C61 Malignant neoplasm of prostate: Secondary | ICD-10-CM | POA: Diagnosis not present

## 2020-02-26 DIAGNOSIS — G20 Parkinson's disease: Secondary | ICD-10-CM | POA: Diagnosis not present

## 2020-02-26 DIAGNOSIS — F0280 Dementia in other diseases classified elsewhere without behavioral disturbance: Secondary | ICD-10-CM | POA: Diagnosis not present

## 2020-02-26 DIAGNOSIS — B961 Klebsiella pneumoniae [K. pneumoniae] as the cause of diseases classified elsewhere: Secondary | ICD-10-CM | POA: Diagnosis not present

## 2020-02-26 DIAGNOSIS — N139 Obstructive and reflux uropathy, unspecified: Secondary | ICD-10-CM | POA: Diagnosis not present

## 2020-02-26 DIAGNOSIS — R339 Retention of urine, unspecified: Secondary | ICD-10-CM | POA: Diagnosis not present

## 2020-02-26 DIAGNOSIS — B9689 Other specified bacterial agents as the cause of diseases classified elsewhere: Secondary | ICD-10-CM | POA: Diagnosis not present

## 2020-02-27 DIAGNOSIS — N281 Cyst of kidney, acquired: Secondary | ICD-10-CM | POA: Diagnosis not present

## 2020-02-27 DIAGNOSIS — R310 Gross hematuria: Secondary | ICD-10-CM | POA: Diagnosis not present

## 2020-02-27 DIAGNOSIS — N323 Diverticulum of bladder: Secondary | ICD-10-CM | POA: Diagnosis not present

## 2020-02-27 DIAGNOSIS — Z87891 Personal history of nicotine dependence: Secondary | ICD-10-CM | POA: Diagnosis not present

## 2020-02-27 DIAGNOSIS — I2510 Atherosclerotic heart disease of native coronary artery without angina pectoris: Secondary | ICD-10-CM | POA: Diagnosis not present

## 2020-02-27 DIAGNOSIS — R9341 Abnormal radiologic findings on diagnostic imaging of renal pelvis, ureter, or bladder: Secondary | ICD-10-CM | POA: Diagnosis not present

## 2020-02-27 DIAGNOSIS — D3502 Benign neoplasm of left adrenal gland: Secondary | ICD-10-CM | POA: Diagnosis not present

## 2020-02-28 DIAGNOSIS — F0280 Dementia in other diseases classified elsewhere without behavioral disturbance: Secondary | ICD-10-CM | POA: Diagnosis not present

## 2020-02-28 DIAGNOSIS — Z466 Encounter for fitting and adjustment of urinary device: Secondary | ICD-10-CM | POA: Diagnosis not present

## 2020-02-28 DIAGNOSIS — N139 Obstructive and reflux uropathy, unspecified: Secondary | ICD-10-CM | POA: Diagnosis not present

## 2020-02-28 DIAGNOSIS — B9689 Other specified bacterial agents as the cause of diseases classified elsewhere: Secondary | ICD-10-CM | POA: Diagnosis not present

## 2020-02-28 DIAGNOSIS — N390 Urinary tract infection, site not specified: Secondary | ICD-10-CM | POA: Diagnosis not present

## 2020-02-28 DIAGNOSIS — G20 Parkinson's disease: Secondary | ICD-10-CM | POA: Diagnosis not present

## 2020-02-28 DIAGNOSIS — R339 Retention of urine, unspecified: Secondary | ICD-10-CM | POA: Diagnosis not present

## 2020-02-28 DIAGNOSIS — C61 Malignant neoplasm of prostate: Secondary | ICD-10-CM | POA: Diagnosis not present

## 2020-02-28 DIAGNOSIS — B961 Klebsiella pneumoniae [K. pneumoniae] as the cause of diseases classified elsewhere: Secondary | ICD-10-CM | POA: Diagnosis not present

## 2020-03-03 DIAGNOSIS — Z466 Encounter for fitting and adjustment of urinary device: Secondary | ICD-10-CM | POA: Diagnosis not present

## 2020-03-03 DIAGNOSIS — B961 Klebsiella pneumoniae [K. pneumoniae] as the cause of diseases classified elsewhere: Secondary | ICD-10-CM | POA: Diagnosis not present

## 2020-03-03 DIAGNOSIS — B9689 Other specified bacterial agents as the cause of diseases classified elsewhere: Secondary | ICD-10-CM | POA: Diagnosis not present

## 2020-03-03 DIAGNOSIS — N390 Urinary tract infection, site not specified: Secondary | ICD-10-CM | POA: Diagnosis not present

## 2020-03-03 DIAGNOSIS — N139 Obstructive and reflux uropathy, unspecified: Secondary | ICD-10-CM | POA: Diagnosis not present

## 2020-03-03 DIAGNOSIS — F0280 Dementia in other diseases classified elsewhere without behavioral disturbance: Secondary | ICD-10-CM | POA: Diagnosis not present

## 2020-03-03 DIAGNOSIS — G20 Parkinson's disease: Secondary | ICD-10-CM | POA: Diagnosis not present

## 2020-03-03 DIAGNOSIS — R339 Retention of urine, unspecified: Secondary | ICD-10-CM | POA: Diagnosis not present

## 2020-03-03 DIAGNOSIS — C61 Malignant neoplasm of prostate: Secondary | ICD-10-CM | POA: Diagnosis not present

## 2020-03-04 DIAGNOSIS — G20 Parkinson's disease: Secondary | ICD-10-CM | POA: Diagnosis not present

## 2020-03-04 DIAGNOSIS — F0280 Dementia in other diseases classified elsewhere without behavioral disturbance: Secondary | ICD-10-CM | POA: Diagnosis not present

## 2020-03-04 DIAGNOSIS — R339 Retention of urine, unspecified: Secondary | ICD-10-CM | POA: Diagnosis not present

## 2020-03-04 DIAGNOSIS — B9689 Other specified bacterial agents as the cause of diseases classified elsewhere: Secondary | ICD-10-CM | POA: Diagnosis not present

## 2020-03-04 DIAGNOSIS — N139 Obstructive and reflux uropathy, unspecified: Secondary | ICD-10-CM | POA: Diagnosis not present

## 2020-03-04 DIAGNOSIS — B961 Klebsiella pneumoniae [K. pneumoniae] as the cause of diseases classified elsewhere: Secondary | ICD-10-CM | POA: Diagnosis not present

## 2020-03-04 DIAGNOSIS — C61 Malignant neoplasm of prostate: Secondary | ICD-10-CM | POA: Diagnosis not present

## 2020-03-04 DIAGNOSIS — N390 Urinary tract infection, site not specified: Secondary | ICD-10-CM | POA: Diagnosis not present

## 2020-03-04 DIAGNOSIS — Z466 Encounter for fitting and adjustment of urinary device: Secondary | ICD-10-CM | POA: Diagnosis not present

## 2020-03-06 DIAGNOSIS — Z466 Encounter for fitting and adjustment of urinary device: Secondary | ICD-10-CM | POA: Diagnosis not present

## 2020-03-06 DIAGNOSIS — R339 Retention of urine, unspecified: Secondary | ICD-10-CM | POA: Diagnosis not present

## 2020-03-06 DIAGNOSIS — N139 Obstructive and reflux uropathy, unspecified: Secondary | ICD-10-CM | POA: Diagnosis not present

## 2020-03-06 DIAGNOSIS — B9689 Other specified bacterial agents as the cause of diseases classified elsewhere: Secondary | ICD-10-CM | POA: Diagnosis not present

## 2020-03-06 DIAGNOSIS — B961 Klebsiella pneumoniae [K. pneumoniae] as the cause of diseases classified elsewhere: Secondary | ICD-10-CM | POA: Diagnosis not present

## 2020-03-06 DIAGNOSIS — F0280 Dementia in other diseases classified elsewhere without behavioral disturbance: Secondary | ICD-10-CM | POA: Diagnosis not present

## 2020-03-06 DIAGNOSIS — N390 Urinary tract infection, site not specified: Secondary | ICD-10-CM | POA: Diagnosis not present

## 2020-03-06 DIAGNOSIS — G20 Parkinson's disease: Secondary | ICD-10-CM | POA: Diagnosis not present

## 2020-03-06 DIAGNOSIS — C61 Malignant neoplasm of prostate: Secondary | ICD-10-CM | POA: Diagnosis not present

## 2020-03-09 DIAGNOSIS — B9689 Other specified bacterial agents as the cause of diseases classified elsewhere: Secondary | ICD-10-CM | POA: Diagnosis not present

## 2020-03-09 DIAGNOSIS — C61 Malignant neoplasm of prostate: Secondary | ICD-10-CM | POA: Diagnosis not present

## 2020-03-09 DIAGNOSIS — F0280 Dementia in other diseases classified elsewhere without behavioral disturbance: Secondary | ICD-10-CM | POA: Diagnosis not present

## 2020-03-09 DIAGNOSIS — R339 Retention of urine, unspecified: Secondary | ICD-10-CM | POA: Diagnosis not present

## 2020-03-09 DIAGNOSIS — G20 Parkinson's disease: Secondary | ICD-10-CM | POA: Diagnosis not present

## 2020-03-09 DIAGNOSIS — Z466 Encounter for fitting and adjustment of urinary device: Secondary | ICD-10-CM | POA: Diagnosis not present

## 2020-03-09 DIAGNOSIS — N139 Obstructive and reflux uropathy, unspecified: Secondary | ICD-10-CM | POA: Diagnosis not present

## 2020-03-09 DIAGNOSIS — B961 Klebsiella pneumoniae [K. pneumoniae] as the cause of diseases classified elsewhere: Secondary | ICD-10-CM | POA: Diagnosis not present

## 2020-03-09 DIAGNOSIS — N390 Urinary tract infection, site not specified: Secondary | ICD-10-CM | POA: Diagnosis not present

## 2020-03-10 DIAGNOSIS — C61 Malignant neoplasm of prostate: Secondary | ICD-10-CM | POA: Diagnosis not present

## 2020-03-10 DIAGNOSIS — R339 Retention of urine, unspecified: Secondary | ICD-10-CM | POA: Diagnosis not present

## 2020-03-10 DIAGNOSIS — Z466 Encounter for fitting and adjustment of urinary device: Secondary | ICD-10-CM | POA: Diagnosis not present

## 2020-03-10 DIAGNOSIS — B9689 Other specified bacterial agents as the cause of diseases classified elsewhere: Secondary | ICD-10-CM | POA: Diagnosis not present

## 2020-03-10 DIAGNOSIS — B961 Klebsiella pneumoniae [K. pneumoniae] as the cause of diseases classified elsewhere: Secondary | ICD-10-CM | POA: Diagnosis not present

## 2020-03-10 DIAGNOSIS — F0280 Dementia in other diseases classified elsewhere without behavioral disturbance: Secondary | ICD-10-CM | POA: Diagnosis not present

## 2020-03-10 DIAGNOSIS — G20 Parkinson's disease: Secondary | ICD-10-CM | POA: Diagnosis not present

## 2020-03-10 DIAGNOSIS — N390 Urinary tract infection, site not specified: Secondary | ICD-10-CM | POA: Diagnosis not present

## 2020-03-10 DIAGNOSIS — N139 Obstructive and reflux uropathy, unspecified: Secondary | ICD-10-CM | POA: Diagnosis not present

## 2020-03-12 DIAGNOSIS — F0280 Dementia in other diseases classified elsewhere without behavioral disturbance: Secondary | ICD-10-CM | POA: Diagnosis not present

## 2020-03-12 DIAGNOSIS — N390 Urinary tract infection, site not specified: Secondary | ICD-10-CM | POA: Diagnosis not present

## 2020-03-12 DIAGNOSIS — C61 Malignant neoplasm of prostate: Secondary | ICD-10-CM | POA: Diagnosis not present

## 2020-03-12 DIAGNOSIS — B961 Klebsiella pneumoniae [K. pneumoniae] as the cause of diseases classified elsewhere: Secondary | ICD-10-CM | POA: Diagnosis not present

## 2020-03-12 DIAGNOSIS — N139 Obstructive and reflux uropathy, unspecified: Secondary | ICD-10-CM | POA: Diagnosis not present

## 2020-03-12 DIAGNOSIS — B9689 Other specified bacterial agents as the cause of diseases classified elsewhere: Secondary | ICD-10-CM | POA: Diagnosis not present

## 2020-03-12 DIAGNOSIS — G20 Parkinson's disease: Secondary | ICD-10-CM | POA: Diagnosis not present

## 2020-03-12 DIAGNOSIS — R339 Retention of urine, unspecified: Secondary | ICD-10-CM | POA: Diagnosis not present

## 2020-03-12 DIAGNOSIS — Z466 Encounter for fitting and adjustment of urinary device: Secondary | ICD-10-CM | POA: Diagnosis not present

## 2020-03-13 DIAGNOSIS — G20 Parkinson's disease: Secondary | ICD-10-CM | POA: Diagnosis not present

## 2020-03-13 DIAGNOSIS — C61 Malignant neoplasm of prostate: Secondary | ICD-10-CM | POA: Diagnosis not present

## 2020-03-13 DIAGNOSIS — B961 Klebsiella pneumoniae [K. pneumoniae] as the cause of diseases classified elsewhere: Secondary | ICD-10-CM | POA: Diagnosis not present

## 2020-03-13 DIAGNOSIS — N139 Obstructive and reflux uropathy, unspecified: Secondary | ICD-10-CM | POA: Diagnosis not present

## 2020-03-13 DIAGNOSIS — B9689 Other specified bacterial agents as the cause of diseases classified elsewhere: Secondary | ICD-10-CM | POA: Diagnosis not present

## 2020-03-13 DIAGNOSIS — R339 Retention of urine, unspecified: Secondary | ICD-10-CM | POA: Diagnosis not present

## 2020-03-13 DIAGNOSIS — F0280 Dementia in other diseases classified elsewhere without behavioral disturbance: Secondary | ICD-10-CM | POA: Diagnosis not present

## 2020-03-13 DIAGNOSIS — Z466 Encounter for fitting and adjustment of urinary device: Secondary | ICD-10-CM | POA: Diagnosis not present

## 2020-03-13 DIAGNOSIS — N390 Urinary tract infection, site not specified: Secondary | ICD-10-CM | POA: Diagnosis not present

## 2020-03-16 DIAGNOSIS — R339 Retention of urine, unspecified: Secondary | ICD-10-CM | POA: Diagnosis not present

## 2020-03-16 DIAGNOSIS — Z466 Encounter for fitting and adjustment of urinary device: Secondary | ICD-10-CM | POA: Diagnosis not present

## 2020-03-16 DIAGNOSIS — B9689 Other specified bacterial agents as the cause of diseases classified elsewhere: Secondary | ICD-10-CM | POA: Diagnosis not present

## 2020-03-16 DIAGNOSIS — G20 Parkinson's disease: Secondary | ICD-10-CM | POA: Diagnosis not present

## 2020-03-16 DIAGNOSIS — N139 Obstructive and reflux uropathy, unspecified: Secondary | ICD-10-CM | POA: Diagnosis not present

## 2020-03-16 DIAGNOSIS — C61 Malignant neoplasm of prostate: Secondary | ICD-10-CM | POA: Diagnosis not present

## 2020-03-16 DIAGNOSIS — N390 Urinary tract infection, site not specified: Secondary | ICD-10-CM | POA: Diagnosis not present

## 2020-03-16 DIAGNOSIS — F0280 Dementia in other diseases classified elsewhere without behavioral disturbance: Secondary | ICD-10-CM | POA: Diagnosis not present

## 2020-03-16 DIAGNOSIS — B961 Klebsiella pneumoniae [K. pneumoniae] as the cause of diseases classified elsewhere: Secondary | ICD-10-CM | POA: Diagnosis not present

## 2020-03-20 DIAGNOSIS — B9689 Other specified bacterial agents as the cause of diseases classified elsewhere: Secondary | ICD-10-CM | POA: Diagnosis not present

## 2020-03-20 DIAGNOSIS — N390 Urinary tract infection, site not specified: Secondary | ICD-10-CM | POA: Diagnosis not present

## 2020-03-20 DIAGNOSIS — N139 Obstructive and reflux uropathy, unspecified: Secondary | ICD-10-CM | POA: Diagnosis not present

## 2020-03-20 DIAGNOSIS — B961 Klebsiella pneumoniae [K. pneumoniae] as the cause of diseases classified elsewhere: Secondary | ICD-10-CM | POA: Diagnosis not present

## 2020-03-20 DIAGNOSIS — G20 Parkinson's disease: Secondary | ICD-10-CM | POA: Diagnosis not present

## 2020-03-20 DIAGNOSIS — F0280 Dementia in other diseases classified elsewhere without behavioral disturbance: Secondary | ICD-10-CM | POA: Diagnosis not present

## 2020-03-20 DIAGNOSIS — C61 Malignant neoplasm of prostate: Secondary | ICD-10-CM | POA: Diagnosis not present

## 2020-03-20 DIAGNOSIS — Z466 Encounter for fitting and adjustment of urinary device: Secondary | ICD-10-CM | POA: Diagnosis not present

## 2020-03-20 DIAGNOSIS — R339 Retention of urine, unspecified: Secondary | ICD-10-CM | POA: Diagnosis not present

## 2020-03-23 DIAGNOSIS — B961 Klebsiella pneumoniae [K. pneumoniae] as the cause of diseases classified elsewhere: Secondary | ICD-10-CM | POA: Diagnosis not present

## 2020-03-23 DIAGNOSIS — Z466 Encounter for fitting and adjustment of urinary device: Secondary | ICD-10-CM | POA: Diagnosis not present

## 2020-03-23 DIAGNOSIS — C61 Malignant neoplasm of prostate: Secondary | ICD-10-CM | POA: Diagnosis not present

## 2020-03-23 DIAGNOSIS — F0280 Dementia in other diseases classified elsewhere without behavioral disturbance: Secondary | ICD-10-CM | POA: Diagnosis not present

## 2020-03-23 DIAGNOSIS — G20 Parkinson's disease: Secondary | ICD-10-CM | POA: Diagnosis not present

## 2020-03-23 DIAGNOSIS — N390 Urinary tract infection, site not specified: Secondary | ICD-10-CM | POA: Diagnosis not present

## 2020-03-23 DIAGNOSIS — R3911 Hesitancy of micturition: Secondary | ICD-10-CM | POA: Diagnosis not present

## 2020-03-23 DIAGNOSIS — B9689 Other specified bacterial agents as the cause of diseases classified elsewhere: Secondary | ICD-10-CM | POA: Diagnosis not present

## 2020-03-23 DIAGNOSIS — R339 Retention of urine, unspecified: Secondary | ICD-10-CM | POA: Diagnosis not present

## 2020-03-23 DIAGNOSIS — N139 Obstructive and reflux uropathy, unspecified: Secondary | ICD-10-CM | POA: Diagnosis not present

## 2020-03-25 DIAGNOSIS — Z6821 Body mass index (BMI) 21.0-21.9, adult: Secondary | ICD-10-CM | POA: Diagnosis not present

## 2020-03-25 DIAGNOSIS — C61 Malignant neoplasm of prostate: Secondary | ICD-10-CM | POA: Diagnosis not present

## 2020-03-25 DIAGNOSIS — R410 Disorientation, unspecified: Secondary | ICD-10-CM | POA: Diagnosis not present

## 2020-03-25 DIAGNOSIS — I10 Essential (primary) hypertension: Secondary | ICD-10-CM | POA: Diagnosis not present

## 2020-03-25 DIAGNOSIS — R259 Unspecified abnormal involuntary movements: Secondary | ICD-10-CM | POA: Diagnosis not present

## 2020-03-25 DIAGNOSIS — Z23 Encounter for immunization: Secondary | ICD-10-CM | POA: Diagnosis not present

## 2020-03-25 DIAGNOSIS — R339 Retention of urine, unspecified: Secondary | ICD-10-CM | POA: Diagnosis not present

## 2020-03-25 DIAGNOSIS — E1142 Type 2 diabetes mellitus with diabetic polyneuropathy: Secondary | ICD-10-CM | POA: Diagnosis not present

## 2020-03-26 DIAGNOSIS — C61 Malignant neoplasm of prostate: Secondary | ICD-10-CM | POA: Diagnosis not present

## 2020-03-26 DIAGNOSIS — B9689 Other specified bacterial agents as the cause of diseases classified elsewhere: Secondary | ICD-10-CM | POA: Diagnosis not present

## 2020-03-26 DIAGNOSIS — R339 Retention of urine, unspecified: Secondary | ICD-10-CM | POA: Diagnosis not present

## 2020-03-26 DIAGNOSIS — Z466 Encounter for fitting and adjustment of urinary device: Secondary | ICD-10-CM | POA: Diagnosis not present

## 2020-03-26 DIAGNOSIS — G20 Parkinson's disease: Secondary | ICD-10-CM | POA: Diagnosis not present

## 2020-03-26 DIAGNOSIS — F0280 Dementia in other diseases classified elsewhere without behavioral disturbance: Secondary | ICD-10-CM | POA: Diagnosis not present

## 2020-03-26 DIAGNOSIS — B961 Klebsiella pneumoniae [K. pneumoniae] as the cause of diseases classified elsewhere: Secondary | ICD-10-CM | POA: Diagnosis not present

## 2020-03-26 DIAGNOSIS — N139 Obstructive and reflux uropathy, unspecified: Secondary | ICD-10-CM | POA: Diagnosis not present

## 2020-03-26 DIAGNOSIS — N390 Urinary tract infection, site not specified: Secondary | ICD-10-CM | POA: Diagnosis not present

## 2020-03-30 DIAGNOSIS — F0280 Dementia in other diseases classified elsewhere without behavioral disturbance: Secondary | ICD-10-CM | POA: Diagnosis not present

## 2020-03-30 DIAGNOSIS — G20 Parkinson's disease: Secondary | ICD-10-CM | POA: Diagnosis not present

## 2020-03-30 DIAGNOSIS — C61 Malignant neoplasm of prostate: Secondary | ICD-10-CM | POA: Diagnosis not present

## 2020-03-30 DIAGNOSIS — N139 Obstructive and reflux uropathy, unspecified: Secondary | ICD-10-CM | POA: Diagnosis not present

## 2020-03-30 DIAGNOSIS — B9689 Other specified bacterial agents as the cause of diseases classified elsewhere: Secondary | ICD-10-CM | POA: Diagnosis not present

## 2020-03-30 DIAGNOSIS — N390 Urinary tract infection, site not specified: Secondary | ICD-10-CM | POA: Diagnosis not present

## 2020-03-30 DIAGNOSIS — R339 Retention of urine, unspecified: Secondary | ICD-10-CM | POA: Diagnosis not present

## 2020-03-30 DIAGNOSIS — B961 Klebsiella pneumoniae [K. pneumoniae] as the cause of diseases classified elsewhere: Secondary | ICD-10-CM | POA: Diagnosis not present

## 2020-03-30 DIAGNOSIS — Z466 Encounter for fitting and adjustment of urinary device: Secondary | ICD-10-CM | POA: Diagnosis not present

## 2020-11-21 ENCOUNTER — Emergency Department
Admission: EM | Admit: 2020-11-21 | Discharge: 2020-11-21 | Disposition: A | Discharge status: Home / Self Care | Payer: Medicare HMO | Attending: Emergency Medicine | Admitting: Emergency Medicine

## 2020-11-21 ENCOUNTER — Emergency Department: Payer: Medicare HMO

## 2020-11-21 ENCOUNTER — Other Ambulatory Visit: Payer: Self-pay

## 2020-11-21 DIAGNOSIS — F0390 Unspecified dementia without behavioral disturbance: Secondary | ICD-10-CM | POA: Diagnosis not present

## 2020-11-21 DIAGNOSIS — Z79899 Other long term (current) drug therapy: Secondary | ICD-10-CM | POA: Insufficient documentation

## 2020-11-21 DIAGNOSIS — R410 Disorientation, unspecified: Secondary | ICD-10-CM | POA: Diagnosis not present

## 2020-11-21 DIAGNOSIS — I10 Essential (primary) hypertension: Secondary | ICD-10-CM | POA: Insufficient documentation

## 2020-11-21 DIAGNOSIS — R112 Nausea with vomiting, unspecified: Secondary | ICD-10-CM | POA: Insufficient documentation

## 2020-11-21 DIAGNOSIS — I2510 Atherosclerotic heart disease of native coronary artery without angina pectoris: Secondary | ICD-10-CM | POA: Diagnosis not present

## 2020-11-21 DIAGNOSIS — Z7984 Long term (current) use of oral hypoglycemic drugs: Secondary | ICD-10-CM | POA: Diagnosis not present

## 2020-11-21 DIAGNOSIS — Z87891 Personal history of nicotine dependence: Secondary | ICD-10-CM | POA: Diagnosis not present

## 2020-11-21 DIAGNOSIS — Z794 Long term (current) use of insulin: Secondary | ICD-10-CM | POA: Diagnosis not present

## 2020-11-21 DIAGNOSIS — Z8546 Personal history of malignant neoplasm of prostate: Secondary | ICD-10-CM | POA: Diagnosis not present

## 2020-11-21 DIAGNOSIS — Z7982 Long term (current) use of aspirin: Secondary | ICD-10-CM | POA: Insufficient documentation

## 2020-11-21 DIAGNOSIS — Z20822 Contact with and (suspected) exposure to covid-19: Secondary | ICD-10-CM | POA: Insufficient documentation

## 2020-11-21 DIAGNOSIS — E119 Type 2 diabetes mellitus without complications: Secondary | ICD-10-CM | POA: Diagnosis not present

## 2020-11-21 LAB — COMPREHENSIVE METABOLIC PANEL
ALT: <5 U/L (ref 0–44)
AST: 33 U/L (ref 15–41)
Albumin: 4.1 g/dL (ref 3.5–5.0)
Alkaline Phosphatase: 91 U/L (ref 38–126)
Anion gap: 13 (ref 5–15)
BUN: 39 mg/dL — ABNORMAL HIGH (ref 8–23)
CO2: 17 mmol/L — ABNORMAL LOW (ref 22–32)
Calcium: 9.4 mg/dL (ref 8.9–10.3)
Chloride: 102 mmol/L (ref 98–111)
Creatinine, Ser: 1.54 mg/dL — ABNORMAL HIGH (ref 0.61–1.24)
GFR, Estimated: 44 mL/min — ABNORMAL LOW (ref >60)
Glucose, Bld: 130 mg/dL — ABNORMAL HIGH (ref 70–99)
Potassium: 4.6 mmol/L (ref 3.5–5.1)
Sodium: 132 mmol/L — ABNORMAL LOW (ref 135–145)
Total Bilirubin: 0.5 mg/dL (ref 0.3–1.2)
Total Protein: 7.5 g/dL (ref 6.5–8.1)

## 2020-11-21 LAB — CBC
HCT: 28.7 % — ABNORMAL LOW (ref 39.0–52.0)
Hemoglobin: 9.8 g/dL — ABNORMAL LOW (ref 13.0–17.0)
MCH: 29.1 pg (ref 26.0–34.0)
MCHC: 34.1 g/dL (ref 30.0–36.0)
MCV: 85.2 fL (ref 80.0–100.0)
Platelets: 347 10*3/uL (ref 150–400)
RBC: 3.37 MIL/uL — ABNORMAL LOW (ref 4.22–5.81)
RDW: 13.9 % (ref 11.5–15.5)
WBC: 6.4 10*3/uL (ref 4.0–10.5)
nRBC: 0 % (ref 0.0–0.2)

## 2020-11-21 LAB — RESP PANEL BY RT-PCR (FLU A&B, COVID) ARPGX2
Influenza A by PCR: NEGATIVE
Influenza B by PCR: NEGATIVE
SARS Coronavirus 2 by RT PCR: NEGATIVE

## 2020-11-21 LAB — TROPONIN I (HIGH SENSITIVITY)
Troponin I (High Sensitivity): 10 ng/L (ref <18)
Troponin I (High Sensitivity): 14 ng/L (ref <18)

## 2020-11-21 MED ORDER — ONDANSETRON 4 MG PO TBDP: 4.0000 mg | ORAL_TABLET | Freq: Three times a day (TID) | ORAL | 0 refills | Status: AC | PRN

## 2020-11-21 MED ORDER — SODIUM CHLORIDE 0.9 % IV BOLUS
1000.0000 mL | Freq: Once | INTRAVENOUS | Status: AC
  Administered 2020-11-21: 1000 mL via INTRAVENOUS

## 2020-11-21 MED ORDER — ONDANSETRON HCL 4 MG/2ML IJ SOLN
4.0000 mg | Freq: Once | INTRAMUSCULAR | Status: AC
  Administered 2020-11-21: 4 mg via INTRAVENOUS
  Filled 2020-11-21: qty 2

## 2020-11-21 NOTE — ED Notes (Signed)
Pt taken to CT.

## 2020-11-21 NOTE — ED Notes (Signed)
Pt assisted to toilet, ambulatory with assistance. No urine obtained.

## 2020-11-21 NOTE — ED Provider Notes (Signed)
[facility] Emergency Department Provider Note  Time seen: 4:17 PM  I have reviewed the triage vital signs and the nursing notes.   HISTORY  Chief Complaint Altered Mental Status and Emesis   HPI Dennis Serrano is a 87 y.o. male with a past medical history of anxiety, gastric reflux, hypertension, hyperlipidemia, Parkinson's, dementia, presents emergency department for nausea vomiting.  According to report patient lives at home with his son who states today patient yelled out and then vomited.  States he also seems more confused than his baseline confusion.  Here the patient is awake he is alert to person and place but not time.  Patient denies any symptoms completely negative review of systems however likely limited to the patient's baseline dementia.   Past Medical History:  Diagnosis Date  . Anxiety   . CAD (coronary artery disease)   . Dementia ([place])   . Diabetes mellitus without complication ([place])   . GERD (gastroesophageal reflux disease)   . History of heart artery stent   . HLD (hyperlipidemia)   . HTN (hypertension)   . Parkinson disease ([place])   . Prostate cancer ([place])    over 5 years ago    Patient Active Problem List   Diagnosis Date Noted  . Medication overdose 09/02/2018  . TIA (transient ischemic attack) 10/20/2016  . Pressure injury of skin 10/07/2016  . Sepsis ([place]) 10/02/2016  . CAP (community acquired pneumonia) 10/02/2016  . HTN (hypertension) 10/02/2016  . HLD (hyperlipidemia) 10/02/2016  . CAD (coronary artery disease) 10/02/2016  . GERD (gastroesophageal reflux disease) 10/02/2016    Past Surgical History:  Procedure Laterality Date  . APPENDECTOMY    . CORONARY STENT PLACEMENT    . EYE SURGERY    . TRANSURETHRAL RESECTION OF PROSTATE      Prior to Admission medications   Medication Sig Start Date End Date Taking? Authorizing Provider  acetaminophen (TYLENOL) 500 MG tablet Take 500 mg by mouth every 8 (eight) hours.     [provider]  albuterol (PROVENTIL HFA;VENTOLIN HFA) 108 (90 Base) MCG/ACT inhaler Inhale 2 puffs into the lungs every 6 (six) hours as needed for wheezing or shortness of breath. 10/07/16   [name], MD  amLODipine (NORVASC) 5 MG tablet Take 5 mg by mouth daily. 11/10/19   [provider]  ascorbic acid (VITAMIN C) 250 MG tablet Take by mouth.    [provider]  aspirin 325 MG tablet Take 1 tablet (325 mg total) by mouth daily. Patient taking differently: Take 81 mg by mouth daily.  10/08/16   [name], MD  carbidopa-levodopa (SINEMET IR) 25-100 MG tablet Take 1 tablet by mouth 3 (three) times daily.    [provider]  cholecalciferol (VITAMIN D) 25 MCG (1000 UT) tablet Take 2,000 Units by mouth daily.    [provider]  cloNIDine (CATAPRES) 0.1 MG tablet Take 0.1 mg by mouth 2 (two) times daily. 08/14/18   [provider]  diclofenac sodium (VOLTAREN) 1 % GEL Apply 2 g topically 4 (four) times daily.    [provider]  diltiazem (CARDIZEM CD) 180 MG 24 hr capsule Take 1 capsule (180 mg total) by mouth daily. 09/06/18   Dustin Flock, MD  docusate sodium (COLACE) 100 MG capsule Take 100 mg by mouth 2 (two) times daily. Patient not taking: Reported on 01/13/2020    [provider]  furosemide (LASIX) 20 MG tablet Take 20 mg by mouth daily. Patient not taking: Reported  on 01/13/2020 08/20/18   [provider]  gemfibrozil (LOPID) 600 MG tablet Take 600 mg by mouth 2 (two) times daily. Patient not taking: Reported on 01/13/2020 08/20/18   [provider]  hydrALAZINE (APRESOLINE) 100 MG tablet Take 100 mg by mouth 4 (four) times daily. 08/20/18   [provider]  insulin detemir (LEVEMIR) 100 UNIT/ML injection Inject 0.12 mLs (12 Units total) into the skin at bedtime. Patient taking differently: Inject 39 Units into the skin every morning.  10/07/16   [name], MD   lisinopril (PRINIVIL,ZESTRIL) 40 MG tablet Take 40 mg by mouth daily.    [provider]  Magnesium Gluconate 500 (27 Mg) MG TABS Take 1,000 mg by mouth 3 (three) times daily.    [provider]  magnesium hydroxide (MILK OF MAGNESIA) 400 MG/5ML suspension Take 30 mLs by mouth daily as needed for mild constipation.    [provider]  metFORMIN (GLUCOPHAGE) 1000 MG tablet Take 1,000 mg by mouth 2 (two) times daily with a meal.    [provider]  Multiple Vitamin (MULTI-VITAMIN) tablet Take 1 tablet by mouth daily.    [provider]  ondansetron (ZOFRAN) 4 MG tablet Take 4 mg by mouth every 8 (eight) hours as needed. 12/31/19   [provider]  spironolactone (ALDACTONE) 25 MG tablet Take 25 mg by mouth daily. 11/10/19   [provider]  tamsulosin (FLOMAX) 0.4 MG CAPS capsule Take 0.4 mg by mouth daily. 12/31/19   [provider]  Zinc Sulfate 220 (50 Zn) MG TABS Take by mouth.    [provider]    No Known Allergies  Family History  Problem Relation Age of Onset  . Heart disease Mother   . Heart disease Father   . Cancer Sister   . Cancer Brother     Social History Social History   Tobacco Use  . Smoking status: Former Smoker    Types: Cigarettes    Quit date: 1980    Years since quitting: 42.4  . Smokeless tobacco: Never Used  Substance Use Topics  . Alcohol use: No  . Drug use: No    Review of Systems Constitutional: Negative for fever. Cardiovascular: Negative for chest pain. Respiratory: Negative for shortness of breath. Gastrointestinal: Negative for abdominal pain.  Patient denies vomiting. Genitourinary: Negative for urinary compaints Musculoskeletal: Negative for musculoskeletal complaints Neurological: Negative for headache All other ROS negative, although likely limited due to baseline dementia.  ____________________________________________   PHYSICAL EXAM:  VITAL SIGNS: ED  Triage Vitals  Enc Vitals Group     BP 11/21/20 1608 (!) 156/66     Pulse Rate 11/21/20 1608 67     Resp 11/21/20 1608 19     Temp 11/21/20 1608 97.7 F (36.5 C)     Temp Source 11/21/20 1608 Oral     SpO2 11/21/20 1608 100 %     Weight 11/21/20 1609 155 lb (70.3 kg)     Height 11/21/20 1609 5\' 9"  (1.753 m)     Head Circumference --      Peak Flow --      Pain Score 11/21/20 1609 0     Pain Loc --      Pain Edu? --      Excl. in [place]? --    Constitutional: Alert and oriented to person and place only.  No distress.  No complaints. Eyes: Normal exam ENT      Head: Normocephalic and atraumatic.  Mouth/Throat: Mucous membranes are moist. Cardiovascular: Normal rate, regular rhythm.  Respiratory: Normal respiratory effort without tachypnea nor retractions. Breath sounds are clear  Gastrointestinal: Soft and nontender. No distention.  Musculoskeletal: Nontender with normal range of motion in all extremities.  Neurologic:  Normal speech and language. No gross focal neurologic deficits  Skin:  Skin is warm, dry and intact.  Psychiatric: Mood and affect are normal.  ____________________________________________    EKG  EKG viewed and interpreted by myself shows an undetermined rhythm at 68 bpm with a borderline widened QRS, left axis deviation, nonspecific ST changes.  ____________________________________________    RADIOLOGY  CT scan head is negative for acute abnormality  ____________________________________________   INITIAL IMPRESSION / ASSESSMENT AND PLAN / ED COURSE  Pertinent labs & imaging results that were available during my care of the patient were reviewed by me and considered in my medical decision making (see chart for details).   Patient presents emergency department for nausea vomiting and increased confusion today.  Patient is oriented to person and place but not time awaiting family arrival for further history to see what his baseline is.  Patient denies  any complaints but per report was nauseated and had vomiting at home.  Patient appears to have a benign abdomen on my exam.  We will check labs including a COVID swab obtain a CT scan of the head, urine sample and IV hydrate while awaiting results.  Patient agreeable to plan of care.  Patient's lab work overall is reassuring.  No significant or concerning findings.  COVID and flu is negative.  CT is negative.  Patient not been able to provide a urinalysis but urinated twice while in the emergency department.  Patient's family member is now here with the patient states the patient is acting normal/baseline.  Patient is asking to go home with a family member is asking to be discharged.  Given the patient's reassuring work-up I believe this is reasonable.  I did discuss with the family member if the patient acts abnormal to return to the emergency department for further work-up and urinalysis.  Dennis Serrano was evaluated in Emergency Department on 11/21/2020 for the symptoms described in the history of present illness. He was evaluated in the context of the global COVID-19 pandemic, which necessitated consideration that the patient might be at risk for infection with the SARS-CoV-2 virus that causes COVID-19. Institutional protocols and algorithms that pertain to the evaluation of patients at risk for COVID-19 are in a state of rapid change based on information released by regulatory bodies including the CDC and federal and state organizations. These policies and algorithms were followed during the patient's care in the ED.  ____________________________________________   FINAL CLINICAL IMPRESSION(S) / ED DIAGNOSES  Nausea vomiting Confusion   [name], MD 11/21/20 2011

## 2020-11-21 NOTE — ED Triage Notes (Signed)
Per EMS: from home, lives with son. Son called due to pt yelling out an hour ago and throwing up, per son pt is normally oriented but was altered today. 4 mg zofran given IM en route. Pt is alert, oriented to self only. Per EMS, facial droop is baseline. Movement and strength equal in upper and lower extremities.

## 2021-06-08 ENCOUNTER — Ambulatory Visit: Payer: Medicare HMO | Admitting: Dermatology

## 2021-06-08 ENCOUNTER — Other Ambulatory Visit: Payer: Self-pay

## 2021-06-08 DIAGNOSIS — C4442 Squamous cell carcinoma of skin of scalp and neck: Secondary | ICD-10-CM | POA: Diagnosis not present

## 2021-06-08 DIAGNOSIS — L578 Other skin changes due to chronic exposure to nonionizing radiation: Secondary | ICD-10-CM | POA: Diagnosis not present

## 2021-06-08 DIAGNOSIS — L821 Other seborrheic keratosis: Secondary | ICD-10-CM | POA: Diagnosis not present

## 2021-06-08 DIAGNOSIS — C4492 Squamous cell carcinoma of skin, unspecified: Secondary | ICD-10-CM

## 2021-06-08 DIAGNOSIS — D485 Neoplasm of uncertain behavior of skin: Secondary | ICD-10-CM

## 2021-06-08 HISTORY — DX: Squamous cell carcinoma of skin, unspecified: C44.92

## 2021-06-08 MED ORDER — MUPIROCIN 2 % EX OINT: TOPICAL_OINTMENT | CUTANEOUS | 0 refills | Status: AC

## 2021-06-08 NOTE — Patient Instructions (Addendum)

## 2021-06-08 NOTE — Progress Notes (Signed)
° °  New Patient Visit  Subjective  Clark Cuff is a 87 y.o. male who presents for the following: Growth (Scalp x 10-11 months. Patient went to plastic surgeon several months ago and he wouldn't take growth off. ). Has grown in size and now is sore to touch.  Patient here with caregiver, Jana Half.  The following portions of the chart were reviewed this encounter and updated as appropriate:       Review of Systems:  No other skin or systemic complaints except as noted in HPI or Assessment and Plan.  Objective  Well appearing patient in no apparent distress; mood and affect are within normal limits.  A focused examination was performed including face, scalp. Relevant physical exam findings are noted in the Assessment and Plan.  L vertex 2.5cm at base cutaneous keratotic horn       Assessment & Plan  Seborrheic Keratoses - Stuck-on, waxy, tan-brown papules and/or plaques of the scalp - Benign-appearing - Discussed benign etiology and prognosis. - Observe - Call for any changes -Recommend T-Sal shampoo to scalp  Actinic Damage - chronic, secondary to cumulative UV radiation exposure/sun exposure over time - diffuse scaly erythematous macules with underlying dyspigmentation - Recommend daily broad spectrum sunscreen SPF 30+ to sun-exposed areas, reapply every 2 hours as needed.  - Recommend staying in the shade or wearing long sleeves, sun glasses (UVA+UVB protection) and wide brim hats (4-inch brim around the entire circumference of the hat). - Call for new or changing lesions.   Neoplasm of uncertain behavior of skin L vertex  Skin / nail biopsy Type of biopsy: tangential   Informed consent: discussed and consent obtained   Patient was prepped and draped in usual sterile fashion: Area prepped with alcohol. Anesthesia: the lesion was anesthetized in a standard fashion   Anesthetic:  1% lidocaine w/ epinephrine 1-100,000 buffered w/ 8.4% NaHCO3 Instrument used: DermaBlade    Hemostasis achieved with: pressure, aluminum chloride and electrodesiccation   Outcome: patient tolerated procedure well    Destruction of lesion  Destruction method: electrodesiccation and curettage   Informed consent: discussed and consent obtained   Timeout:  patient name, date of birth, surgical site, and procedure verified Curettage performed in three different directions: Yes   Electrodesiccation performed over the curetted area: Yes   Curettage cycles:  2 Lesion length (cm):  2.5 Margin per side (cm):  0.5 Final wound size (cm):  3.5 Hemostasis achieved with:  pressure, aluminum chloride and electrodesiccation Outcome: patient tolerated procedure well with no complications   Post-procedure details: wound care instructions given   Post-procedure details comment:  Ointment and bandage applied.  mupirocin ointment (BACTROBAN) 2 % Apply once to twice a day to affected area scalp until healed.  Specimen 1 - Surgical pathology Differential Diagnosis: r/o SCC Check Margins: No 2.5cm cutaneous keratotic horn at base EDC today  Biopsy followed by [facility]. If positive/recurrent, discussed Moh's +/- radiation vs other.   Return pending biopsy results.  IJamesetta Orleans, CMA, am acting as scribe for Brendolyn Patty, MD .  Documentation: I have reviewed the above documentation for accuracy and completeness, and I agree with the above.  Brendolyn Patty MD

## 2021-06-14 ENCOUNTER — Telehealth: Payer: Self-pay

## 2021-06-14 MED ORDER — DOXYCYCLINE HYCLATE 100 MG PO TABS: ORAL_TABLET | ORAL | 0 refills | Status: DC

## 2021-06-14 NOTE — Telephone Encounter (Signed)
Dr Nicole Kindred pt, care taker called concerned about warm feeling around biopsy site on pt vertex scalp, bite site is red appears to be healing, no pain or tenderness pt using Mupirocin ointment, care taker would like to know if pt need antibiotics

## 2021-06-14 NOTE — Telephone Encounter (Signed)
Called pt care giver discussed  may send Doxycycline 100 mg 1 po bid with food #14 [id number].   Erx'd to [facility] in [facility]

## 2021-06-23 ENCOUNTER — Telehealth: Payer: Self-pay

## 2021-06-23 NOTE — Telephone Encounter (Signed)
Left message for daughter, [name], to return call.

## 2021-06-23 NOTE — Telephone Encounter (Signed)
Spoke with pts daughter and informed her of results. She had no concerns. Scheduled 3 mo f/u.

## 2021-06-23 NOTE — Telephone Encounter (Signed)
-----   Message from Brendolyn Patty, MD sent at 06/22/2021 10:45 AM EST ----- Skin (M), left vertex WELL DIFFERENTIATED SQUAMOUS CELL CARCINOMA   SCC skin cancer- already treated with EDC at time of biopsy, but since large lesion, may recur.  Recheck site in 3 months. If it recurs, would need Mohs surgery vrs radiation.  - please call

## 2021-07-14 ENCOUNTER — Telehealth: Payer: Self-pay

## 2021-07-14 NOTE — Telephone Encounter (Signed)
-----   Message from Brendolyn Patty, MD sent at 06/22/2021 10:45 AM EST ----- Skin (M), left vertex WELL DIFFERENTIATED SQUAMOUS CELL CARCINOMA   SCC skin cancer- already treated with EDC at time of biopsy, but since large lesion, may recur.  Recheck site in 3 months. If it recurs, would need Mohs surgery vrs radiation.  - please call

## 2021-07-14 NOTE — Telephone Encounter (Signed)
Left message on daughter's voicemail to return my call.

## 2021-07-15 NOTE — Telephone Encounter (Signed)
Patient's daughter has been advised of BX results. aw

## 2021-08-30 ENCOUNTER — Other Ambulatory Visit: Payer: Self-pay

## 2021-08-30 ENCOUNTER — Ambulatory Visit (INDEPENDENT_AMBULATORY_CARE_PROVIDER_SITE_OTHER): Payer: Medicare HMO | Admitting: Dermatology

## 2021-08-30 DIAGNOSIS — C4492 Squamous cell carcinoma of skin, unspecified: Secondary | ICD-10-CM

## 2021-08-30 DIAGNOSIS — L821 Other seborrheic keratosis: Secondary | ICD-10-CM

## 2021-08-30 DIAGNOSIS — C4442 Squamous cell carcinoma of skin of scalp and neck: Secondary | ICD-10-CM | POA: Diagnosis not present

## 2021-08-30 DIAGNOSIS — L820 Inflamed seborrheic keratosis: Secondary | ICD-10-CM

## 2021-08-30 DIAGNOSIS — L219 Seborrheic dermatitis, unspecified: Secondary | ICD-10-CM

## 2021-08-30 NOTE — Progress Notes (Signed)
? ?  Follow-Up Visit ?  ?Subjective  ?Dennis Serrano is a 88 y.o. male who presents for the following: Squamous Cell Carcinoma (Left vertex, biopsy and EDC 06/08/21. ). ? ?Patient accompanied by caregiver.  ? ?The following portions of the chart were reviewed this encounter and updated as appropriate:  ?  ?  ? ?Review of Systems:  No other skin or systemic complaints except as noted in HPI or Assessment and Plan. ? ?Objective  ?Well appearing patient in no apparent distress; mood and affect are within normal limits. ? ?A focused examination was performed including face, scalp. Relevant physical exam findings are noted in the Assessment and Plan. ? ?Left Vertex ?Healed biopsy/EDC site with smooth pink/white scar ? ?scalp, face ?Pink patches with greasy scale eyebrows, glabella. Adherent confluent yellow/white scale on crown ? ?Left Temporal Scalp x 1, Left Jaw x 1, Right forehead x 1 (3) ?Erythematous stuck-on, waxy papule ?L lower cheek/jaw with heme ooze/crusting ? ? ? ?Assessment & Plan  ?Squamous cell carcinoma of skin ?Left Vertex ? ?Biopsy proven. s/p EDC. Well-healed. ?Clear. Observe for recurrence. Call clinic for new or changing lesions.  Recommend regular skin exams, daily broad-spectrum spf 30+ sunscreen use, and photoprotection.     ? ?Seborrheic dermatitis ?scalp, face ? ?Seborrheic Dermatitis  ?-  is a chronic persistent rash characterized by pinkness and scaling most commonly of the mid face but also can occur on the scalp (dandruff), ears; mid chest, mid back and groin.  It tends to be exacerbated by stress and cooler weather.  People who have neurologic disease may experience new onset or exacerbation of existing seborrheic dermatitis.  The condition is not curable but treatable and can be controlled. ? ?Recommend Mineral oil to scalp, let sit several hours. ?Wash out with Tsal shampoo and scalp massager to loosen scale ? ?Start OTC 1% Hydrocortisone Cream qd/bid pink scaly areas on face. ? ?Inflamed  seborrheic keratosis (3) ?Left Temporal Scalp x 1, Left Jaw x 1, Right forehead x 1 ? ?Recheck L lower cheek/jaw on f/up ? ?Destruction of lesion - Left Temporal Scalp x 1, Left Jaw x 1, Right forehead x 1 ? ?Destruction method: cryotherapy   ?Informed consent: discussed and consent obtained   ?Lesion destroyed using liquid nitrogen: Yes   ?Region frozen until ice ball extended beyond lesion: Yes   ?Outcome: patient tolerated procedure well with no complications   ?Post-procedure details: wound care instructions given   ?Additional details:  Prior to procedure, discussed risks of blister formation, small wound, skin dyspigmentation, or rare scar following cryotherapy. Recommend Vaseline ointment to treated areas while healing. ? ? ?Seborrheic Keratoses ?- Stuck-on, waxy, tan-brown papules and/or plaques scalp, face ?- Benign-appearing ?- Discussed benign etiology and prognosis. ?- Observe ?- Call for any changes ? ? ? ?  ?Return in about 3 months (around 11/30/2021) for Hx SCC, ISKs, recheck L jaw. ? ?I, Jamesetta Orleans, CMA, am acting as scribe for Brendolyn Patty, MD . ?Documentation: I have reviewed the above documentation for accuracy and completeness, and I agree with the above. ? ?Brendolyn Patty MD  ? ?

## 2021-08-30 NOTE — Patient Instructions (Addendum)
Cryotherapy Aftercare  Wash gently with soap and water everyday.   Apply Vaseline and Band-Aid daily until healed.    Recommend applying mineral oil to scalp, let sit several minutes. Continue T-Sal shampoo, let sit several minutes, use scalp massager to help loosen scale.    Start 1% hydrocortisone cream once or twice daily to pink, scaly areas of the face.  Start Bedias - Apply to thicker bumps on face, forehead once daily.    If You Need Anything After Your Visit  If you have any questions or concerns for your doctor, please call our main line at [phone] and press option 4 to reach your doctor's medical assistant. If no one answers, please leave a voicemail as directed and we will return your call as soon as possible. Messages left after 4 pm will be answered the following business day.   You may also send [place] a message via [place]. We typically respond to MyChart messages within 1-2 business days.  For prescription refills, please ask your pharmacy to contact our office. Our fax number is [phone].  If you have an urgent issue when the clinic is closed that cannot wait until the next business day, you can page your doctor at the number below.    Please note that while we do our best to be available for urgent issues outside of office hours, we are not available 24/7.   If you have an urgent issue and are unable to reach [place], you may choose to seek medical care at your doctor's office, retail clinic, urgent care center, or emergency room.  If you have a medical emergency, please immediately call 911 or go to the emergency department.  Pager Numbers  - Dr. Nehemiah Massed: [phone]  - Dr. Laurence Ferrari: [phone]  - Dr. Nicole Kindred: [phone]  In the event of inclement weather, please call our main line at [phone] for an update on the status of any delays or closures.  Dermatology Medication Tips: Please keep the boxes that topical medications come in in  order to help keep track of the instructions about where and how to use these. Pharmacies typically print the medication instructions only on the boxes and not directly on the medication tubes.   If your medication is too expensive, please contact our office at [phone] option 4 or send [place] a message through [place].   We are unable to tell what your co-pay for medications will be in advance as this is different depending on your insurance coverage. However, we may be able to find a substitute medication at lower cost or fill out paperwork to get insurance to cover a needed medication.   If a prior authorization is required to get your medication covered by your insurance company, please allow [place] 1-2 business days to complete this process.  Drug prices often vary depending on where the prescription is filled and some pharmacies may offer cheaper prices.  The website www.goodrx.com contains coupons for medications through different pharmacies. The prices here do not account for what the cost may be with help from insurance (it may be cheaper with your insurance), but the website can give you the price if you did not use any insurance.  - You can print the associated coupon and take it with your prescription to the pharmacy.  - You may also stop by our office during regular business hours and pick up a GoodRx coupon card.  - If you need your prescription sent electronically to a different  pharmacy, notify our office through [facility] or by phone at [phone] option 4.     Si Usted Necesita Algo Despus de Su Visita  Tambin puede enviarnos un mensaje a travs de Pharmacist, community. Por lo general respondemos a los mensajes de MyChart en el transcurso de 1 a 2 das hbiles.  Para renovar recetas, por favor pida a su farmacia que se ponga en contacto con nuestra oficina. Harland Dingwall de fax es [place] [phone].  Si tiene un asunto urgente cuando la clnica est cerrada y que no puede  esperar hasta el siguiente da hbil, puede llamar/localizar a su doctor(a) al nmero que aparece a continuacin.   Por favor, tenga en cuenta que aunque hacemos todo lo posible para estar disponibles para asuntos urgentes fuera del horario de [place], no estamos disponibles las 24 horas del da, los 7 das de la [place].   Si tiene un problema urgente y no puede comunicarse con nosotros, puede optar por buscar atencin mdica  en el consultorio de su doctor(a), en una clnica privada, en un centro de atencin urgente o en una sala de emergencias.  Si tiene Engineering geologist, por favor llame inmediatamente al 911 o vaya a la sala de emergencias.  Nmeros de bper  - Dr. Nehemiah Massed: [phone]  - Dra. Moye: [phone]  - Dra. Nicole Kindred: [phone]  En caso de inclemencias del [place], por favor llame a Johnsie Kindred principal al [phone] para una actualizacin sobre el [place] de cualquier retraso o cierre.  Consejos para la medicacin en dermatologa: Por favor, guarde las cajas en las que vienen los medicamentos de uso tpico para ayudarle a seguir las instrucciones sobre dnde y cmo usarlos. Las farmacias generalmente imprimen las instrucciones del medicamento slo en las cajas y no directamente en los tubos del [place].   Si su medicamento es muy caro, por favor, pngase en contacto con Zigmund Daniel llamando al [phone] y presione la opcin 4 o envenos un mensaje a travs de Pharmacist, community.   No podemos decirle cul ser su copago por los medicamentos por adelantado ya que esto es diferente dependiendo de la cobertura de su seguro. Sin embargo, es posible que podamos encontrar un medicamento sustituto a Electrical engineer un formulario para que el seguro cubra el medicamento que se considera necesario.   Si se requiere una autorizacin previa para que su compaa de seguros [place] su medicamento, por favor permtanos de 1 a 2 das hbiles para completar este proceso.  Los  precios de los medicamentos varan con frecuencia dependiendo del Environmental consultant de dnde se surte la receta y alguna farmacias pueden ofrecer precios ms baratos.  El sitio web www.goodrx.com tiene cupones para medicamentos de Airline pilot. Los precios aqu no tienen en cuenta lo que podra costar con la ayuda del seguro (puede ser ms barato con su seguro), pero el sitio web puede darle el precio si no utiliz Research scientist (physical sciences).  - Puede imprimir el cupn correspondiente y llevarlo con su receta a la farmacia.  - Tambin puede pasar por nuestra oficina durante el horario de atencin regular y Charity fundraiser una tarjeta de cupones de GoodRx.  - Si necesita que su receta se enve electrnicamente a una farmacia diferente, informe a nuestra oficina a travs de MyChart de [place] o por telfono llamando al [phone] y presione la opcin 4.

## 2021-12-13 ENCOUNTER — Ambulatory Visit: Payer: Medicare HMO | Admitting: Dermatology

## 2022-04-30 ENCOUNTER — Other Ambulatory Visit: Payer: Self-pay

## 2022-04-30 ENCOUNTER — Emergency Department: Payer: Medicare HMO

## 2022-04-30 ENCOUNTER — Inpatient Hospital Stay
Admission: EM | Admit: 2022-04-30 | Discharge: 2022-05-27 | DRG: 871 | Disposition: E | Payer: Medicare HMO | Attending: Internal Medicine | Admitting: Internal Medicine

## 2022-04-30 DIAGNOSIS — I5033 Acute on chronic diastolic (congestive) heart failure: Secondary | ICD-10-CM | POA: Diagnosis not present

## 2022-04-30 DIAGNOSIS — J189 Pneumonia, unspecified organism: Secondary | ICD-10-CM | POA: Diagnosis present

## 2022-04-30 DIAGNOSIS — N400 Enlarged prostate without lower urinary tract symptoms: Secondary | ICD-10-CM | POA: Diagnosis present

## 2022-04-30 DIAGNOSIS — A4189 Other specified sepsis: Secondary | ICD-10-CM | POA: Diagnosis not present

## 2022-04-30 DIAGNOSIS — J9601 Acute respiratory failure with hypoxia: Secondary | ICD-10-CM | POA: Diagnosis not present

## 2022-04-30 DIAGNOSIS — N179 Acute kidney failure, unspecified: Secondary | ICD-10-CM | POA: Diagnosis not present

## 2022-04-30 DIAGNOSIS — Z66 Do not resuscitate: Secondary | ICD-10-CM | POA: Diagnosis present

## 2022-04-30 DIAGNOSIS — E785 Hyperlipidemia, unspecified: Secondary | ICD-10-CM | POA: Diagnosis present

## 2022-04-30 DIAGNOSIS — A419 Sepsis, unspecified organism: Secondary | ICD-10-CM | POA: Diagnosis present

## 2022-04-30 DIAGNOSIS — I2489 Other forms of acute ischemic heart disease: Secondary | ICD-10-CM | POA: Diagnosis present

## 2022-04-30 DIAGNOSIS — Z7189 Other specified counseling: Secondary | ICD-10-CM

## 2022-04-30 DIAGNOSIS — F05 Delirium due to known physiological condition: Secondary | ICD-10-CM | POA: Diagnosis not present

## 2022-04-30 DIAGNOSIS — Z7984 Long term (current) use of oral hypoglycemic drugs: Secondary | ICD-10-CM

## 2022-04-30 DIAGNOSIS — N1831 Chronic kidney disease, stage 3a: Secondary | ICD-10-CM | POA: Diagnosis present

## 2022-04-30 DIAGNOSIS — I2699 Other pulmonary embolism without acute cor pulmonale: Secondary | ICD-10-CM | POA: Clinically undetermined

## 2022-04-30 DIAGNOSIS — R54 Age-related physical debility: Secondary | ICD-10-CM | POA: Diagnosis present

## 2022-04-30 DIAGNOSIS — J441 Chronic obstructive pulmonary disease with (acute) exacerbation: Secondary | ICD-10-CM | POA: Insufficient documentation

## 2022-04-30 DIAGNOSIS — Z87891 Personal history of nicotine dependence: Secondary | ICD-10-CM

## 2022-04-30 DIAGNOSIS — G20A1 Parkinson's disease without dyskinesia, without mention of fluctuations: Secondary | ICD-10-CM | POA: Diagnosis present

## 2022-04-30 DIAGNOSIS — R32 Unspecified urinary incontinence: Secondary | ICD-10-CM | POA: Diagnosis present

## 2022-04-30 DIAGNOSIS — I4819 Other persistent atrial fibrillation: Secondary | ICD-10-CM | POA: Diagnosis present

## 2022-04-30 DIAGNOSIS — Z515 Encounter for palliative care: Secondary | ICD-10-CM

## 2022-04-30 DIAGNOSIS — E860 Dehydration: Secondary | ICD-10-CM | POA: Diagnosis present

## 2022-04-30 DIAGNOSIS — H9190 Unspecified hearing loss, unspecified ear: Secondary | ICD-10-CM | POA: Diagnosis present

## 2022-04-30 DIAGNOSIS — Z7901 Long term (current) use of anticoagulants: Secondary | ICD-10-CM

## 2022-04-30 DIAGNOSIS — Z79899 Other long term (current) drug therapy: Secondary | ICD-10-CM

## 2022-04-30 DIAGNOSIS — Z789 Other specified health status: Secondary | ICD-10-CM | POA: Diagnosis present

## 2022-04-30 DIAGNOSIS — Z7982 Long term (current) use of aspirin: Secondary | ICD-10-CM

## 2022-04-30 DIAGNOSIS — Z1152 Encounter for screening for COVID-19: Secondary | ICD-10-CM

## 2022-04-30 DIAGNOSIS — E871 Hypo-osmolality and hyponatremia: Secondary | ICD-10-CM | POA: Diagnosis not present

## 2022-04-30 DIAGNOSIS — Z955 Presence of coronary angioplasty implant and graft: Secondary | ICD-10-CM

## 2022-04-30 DIAGNOSIS — Z85828 Personal history of other malignant neoplasm of skin: Secondary | ICD-10-CM

## 2022-04-30 DIAGNOSIS — F0280 Dementia in other diseases classified elsewhere without behavioral disturbance: Secondary | ICD-10-CM | POA: Diagnosis present

## 2022-04-30 DIAGNOSIS — I214 Non-ST elevation (NSTEMI) myocardial infarction: Secondary | ICD-10-CM

## 2022-04-30 DIAGNOSIS — I130 Hypertensive heart and chronic kidney disease with heart failure and stage 1 through stage 4 chronic kidney disease, or unspecified chronic kidney disease: Secondary | ICD-10-CM | POA: Diagnosis present

## 2022-04-30 DIAGNOSIS — E1122 Type 2 diabetes mellitus with diabetic chronic kidney disease: Secondary | ICD-10-CM | POA: Diagnosis present

## 2022-04-30 DIAGNOSIS — B9789 Other viral agents as the cause of diseases classified elsewhere: Secondary | ICD-10-CM | POA: Diagnosis present

## 2022-04-30 DIAGNOSIS — I2510 Atherosclerotic heart disease of native coronary artery without angina pectoris: Secondary | ICD-10-CM | POA: Diagnosis present

## 2022-04-30 DIAGNOSIS — Z8249 Family history of ischemic heart disease and other diseases of the circulatory system: Secondary | ICD-10-CM

## 2022-04-30 DIAGNOSIS — K219 Gastro-esophageal reflux disease without esophagitis: Secondary | ICD-10-CM | POA: Diagnosis present

## 2022-04-30 DIAGNOSIS — J439 Emphysema, unspecified: Secondary | ICD-10-CM | POA: Diagnosis present

## 2022-04-30 DIAGNOSIS — G309 Alzheimer's disease, unspecified: Secondary | ICD-10-CM | POA: Diagnosis present

## 2022-04-30 DIAGNOSIS — R6520 Severe sepsis without septic shock: Secondary | ICD-10-CM | POA: Diagnosis present

## 2022-04-30 DIAGNOSIS — Z8546 Personal history of malignant neoplasm of prostate: Secondary | ICD-10-CM

## 2022-04-30 LAB — CBC WITH DIFFERENTIAL/PLATELET
Abs Immature Granulocytes: 0.09 10*3/uL — ABNORMAL HIGH (ref 0.00–0.07)
Basophils Absolute: 0 10*3/uL (ref 0.0–0.1)
Basophils Relative: 0 %
Eosinophils Absolute: 0 10*3/uL (ref 0.0–0.5)
Eosinophils Relative: 0 %
HCT: 34.7 % — ABNORMAL LOW (ref 39.0–52.0)
Hemoglobin: 11.1 g/dL — ABNORMAL LOW (ref 13.0–17.0)
Immature Granulocytes: 1 %
Lymphocytes Relative: 5 %
Lymphs Abs: 0.7 10*3/uL (ref 0.7–4.0)
MCH: 28 pg (ref 26.0–34.0)
MCHC: 32 g/dL (ref 30.0–36.0)
MCV: 87.6 fL (ref 80.0–100.0)
Monocytes Absolute: 0.7 10*3/uL (ref 0.1–1.0)
Monocytes Relative: 5 %
Neutro Abs: 12.1 10*3/uL — ABNORMAL HIGH (ref 1.7–7.7)
Neutrophils Relative %: 89 %
Platelets: 263 10*3/uL (ref 150–400)
RBC: 3.96 MIL/uL — ABNORMAL LOW (ref 4.22–5.81)
RDW: 14.7 % (ref 11.5–15.5)
WBC: 13.6 10*3/uL — ABNORMAL HIGH (ref 4.0–10.5)
nRBC: 0 % (ref 0.0–0.2)

## 2022-04-30 LAB — BLOOD GAS, ARTERIAL
Acid-base deficit: 6.2 mmol/L — ABNORMAL HIGH (ref 0.0–2.0)
Bicarbonate: 17.7 mmol/L — ABNORMAL LOW (ref 20.0–28.0)
Delivery systems: POSITIVE
Expiratory PAP: 5 cmH2O
FIO2: 30 %
Inspiratory PAP: 10 cmH2O
O2 Saturation: 99.3 %
Patient temperature: 37
pCO2 arterial: 30 mmHg — ABNORMAL LOW (ref 32–48)
pH, Arterial: 7.38 (ref 7.35–7.45)
pO2, Arterial: 105 mmHg (ref 83–108)

## 2022-04-30 MED ORDER — IPRATROPIUM-ALBUTEROL 0.5-2.5 (3) MG/3ML IN SOLN
3.0000 mL | Freq: Once | RESPIRATORY_TRACT | Status: AC
  Administered 2022-04-30: 3 mL via RESPIRATORY_TRACT
  Filled 2022-04-30: qty 3

## 2022-04-30 MED ORDER — MAGNESIUM SULFATE 2 GM/50ML IV SOLN
2.0000 g | Freq: Once | INTRAVENOUS | Status: AC
  Administered 2022-04-30: 2 g via INTRAVENOUS
  Filled 2022-04-30: qty 50

## 2022-04-30 NOTE — ED Provider Notes (Signed)
[facility] Provider Note    Event Date/Time   First MD Initiated Contact with Patient 05/02/2022 2317     (approximate)   History   Respiratory Distress   HPI  Level V caveat: Limited by distress and dementia  Dennis Serrano is a 88 y.o. male brought to the ED from home via EMS with a chief complaint of respiratory distress.  Patient with a history of Parkinson's disease, CAD, diabetes and dementia.  No prior history of lung issues or oxygen use.  Family reports shortness of breath this evening.  Given 2 DuoNeb's, 125 mg IV Solu-Medrol prior to arrival by EMS.  Room air saturation 66%; arrives to the ED on nasal cannula and performing active DuoNeb.  Rest of history is limited secondary to patient's dementia.     Past Medical History   Past Medical History:  Diagnosis Date   Anxiety    CAD (coronary artery disease)    Dementia ([place])    Diabetes mellitus without complication (HCC)    GERD (gastroesophageal reflux disease)    History of heart artery stent    HLD (hyperlipidemia)    HTN (hypertension)    Parkinson disease ([place])    Prostate cancer ([place])    over 5 years ago   Squamous cell carcinoma of skin 06/08/2021   L vertex, EDC     Active Problem List   Patient Active Problem List   Diagnosis Date Noted   Medication overdose 09/02/2018   TIA (transient ischemic attack) 10/20/2016   Pressure injury of skin 10/07/2016   Sepsis ([place]) 10/02/2016   CAP (community acquired pneumonia) 10/02/2016   HTN (hypertension) 10/02/2016   HLD (hyperlipidemia) 10/02/2016   CAD (coronary artery disease) 10/02/2016   GERD (gastroesophageal reflux disease) 10/02/2016     Past Surgical History   Past Surgical History:  Procedure Laterality Date   APPENDECTOMY     CORONARY STENT PLACEMENT     EYE SURGERY     TRANSURETHRAL RESECTION OF PROSTATE       Home Medications   Prior to Admission medications   Medication Sig Start Date End Date Taking?  Authorizing Provider  acetaminophen (TYLENOL) 500 MG tablet Take 500 mg by mouth every 8 (eight) hours.    [provider]  aspirin 325 MG tablet Take 1 tablet (325 mg total) by mouth daily. Patient taking differently: Take 81 mg by mouth daily.  10/08/16   [name], MD  carbidopa-levodopa (SINEMET IR) 25-100 MG tablet Take 1 tablet by mouth 3 (three) times daily.    [provider]  doxycycline (VIBRA-TABS) 100 MG tablet Take 1 tablet twice a day with with food x 7 days 06/14/21   Ralene Bathe, MD  furosemide (LASIX) 20 MG tablet Take 20 mg by mouth daily. Patient not taking: Reported on 01/13/2020 08/20/18   [provider]  lisinopril (PRINIVIL,ZESTRIL) 40 MG tablet Take 40 mg by mouth daily.    [provider]  metFORMIN (GLUCOPHAGE) 1000 MG tablet Take 1,000 mg by mouth 2 (two) times daily with a meal.    [provider]  mupirocin ointment (BACTROBAN) 2 % Apply once to twice a day to affected area scalp until healed. 06/08/21   Brendolyn Patty, MD  ondansetron (ZOFRAN ODT) 4 MG disintegrating tablet Take 1 tablet (4 mg total) by mouth every 8 (eight) hours as needed for nausea or vomiting. 11/21/20   [name], MD  spironolactone (ALDACTONE) 25 MG tablet Take 25  mg by mouth daily. 11/10/19   [provider]  tamsulosin (FLOMAX) 0.4 MG CAPS capsule Take 0.4 mg by mouth daily. 12/31/19   [provider]     Allergies  Patient has no known allergies.   Family History   Family History  Problem Relation Age of Onset   Heart disease Mother    Heart disease Father    Cancer Sister    Cancer Brother      Physical Exam  Triage Vital Signs: ED Triage Vitals  Enc Vitals Group     BP      Pulse      Resp      Temp      Temp src      SpO2      Weight      Height      Head Circumference      Peak Flow      Pain Score      Pain Loc      Pain Edu?      Excl. in [place]?     Updated Vital Signs: There  were no vitals taken for this visit.   General: Awake, moderate distress.  CV:  ***Good peripheral perfusion.  Resp:  Increased effort.  Diminished aeration. Abd:  Nontender.  No distention.  Other:  Bilateral calves are nontender and not swollen.   ED Results / Procedures / Treatments  Labs (all labs ordered are listed, but only abnormal results are displayed) Labs Reviewed  CULTURE, BLOOD (ROUTINE X 2)  CULTURE, BLOOD (ROUTINE X 2)  RESP PANEL BY RT-PCR (FLU A&B, COVID) ARPGX2  LACTIC ACID, PLASMA  LACTIC ACID, PLASMA  CBC WITH DIFFERENTIAL/PLATELET  COMPREHENSIVE METABOLIC PANEL  BRAIN NATRIURETIC PEPTIDE  BLOOD GAS, ARTERIAL  PROCALCITONIN  MAGNESIUM  TROPONIN I (HIGH SENSITIVITY)     EKG  ED ECG REPORT I, Preslie Depasquale J, the attending physician, personally viewed and interpreted this ECG.   Date: 05/22/2022  EKG Time: ***  Rate: ***  Rhythm: {ekg findings:315101}  Axis: ***  Intervals:{conduction defects:17367}  ST&T Change: ***    RADIOLOGY I have independently visualized and interpreted patient's chest x-ray as well as noted the radiology interpretation:  Chest x-ray:  Official radiology report(s): No results found.   PROCEDURES:  Critical Care performed: Yes, see critical care procedure note(s)  CRITICAL CARE Performed by: [name]   Total critical care time: *** minutes  Critical care time was exclusive of separately billable procedures and treating other patients.  Critical care was necessary to treat or prevent imminent or life-threatening deterioration.  Critical care was time spent personally by me on the following activities: development of treatment plan with patient and/or surrogate as well as nursing, discussions with consultants, evaluation of patient's response to treatment, examination of patient, obtaining history from patient or surrogate, ordering and performing treatments and interventions, ordering and review of laboratory  studies, ordering and review of radiographic studies, pulse oximetry and re-evaluation of patient's condition.   Marland Kitchen1-3 Lead EKG Interpretation  Performed by: [name], MD Authorized by: [name], MD   Comments:     Patient placed on cardiac monitor to evaluate for arrhythmias    MEDICATIONS ORDERED IN ED: Medications  ipratropium-albuterol (DUONEB) 0.5-2.5 (3) MG/3ML nebulizer solution 3 mL (has no administration in time range)  magnesium sulfate IVPB 2 g 50 mL (has no administration in time range)     IMPRESSION / MDM / ASSESSMENT AND PLAN / ED COURSE  I reviewed the triage vital signs and the nursing notes.                             88 year old male presenting with acute respiratory failure and hypoxia. Differential includes, but is not limited to, viral syndrome, bronchitis including COPD exacerbation, pneumonia, reactive airway disease including asthma, CHF including exacerbation with or without pulmonary/interstitial edema, pneumothorax, ACS, thoracic trauma, and pulmonary embolism.  I have personally reviewed patient's records and note a dermatology appointment on 08/30/2021 for squamous cell carcinoma of the skin.  Patient's presentation is most consistent with acute presentation with potential threat to life or bodily function.  The patient is on the cardiac monitor to evaluate for evidence of arrhythmia and/or significant heart rate changes.  Patient placed on BiPAP immediately upon his arrival to the treatment room.  Will obtain sepsis work-up, chest x-ray.  Administer third DuoNeb, 2g IV magnesium.  Anticipate hospitalization.      FINAL CLINICAL IMPRESSION(S) / ED DIAGNOSES   Final diagnoses:  Acute respiratory failure with hypoxia ([place])     Rx / DC Orders   ED Discharge Orders     None        Note:  This document was prepared using Dragon voice recognition software and may include unintentional dictation errors.

## 2022-05-01 ENCOUNTER — Encounter: Payer: Self-pay | Admitting: Family Medicine

## 2022-05-01 ENCOUNTER — Inpatient Hospital Stay
Admit: 2022-05-01 | Discharge: 2022-05-01 | Disposition: A | Discharge status: Home / Self Care | Payer: Medicare HMO | Attending: Family Medicine | Admitting: Family Medicine

## 2022-05-01 DIAGNOSIS — J189 Pneumonia, unspecified organism: Secondary | ICD-10-CM | POA: Diagnosis present

## 2022-05-01 DIAGNOSIS — F0280 Dementia in other diseases classified elsewhere without behavioral disturbance: Secondary | ICD-10-CM | POA: Insufficient documentation

## 2022-05-01 DIAGNOSIS — N1831 Chronic kidney disease, stage 3a: Secondary | ICD-10-CM | POA: Diagnosis present

## 2022-05-01 DIAGNOSIS — I2489 Other forms of acute ischemic heart disease: Secondary | ICD-10-CM | POA: Diagnosis present

## 2022-05-01 DIAGNOSIS — I2699 Other pulmonary embolism without acute cor pulmonale: Secondary | ICD-10-CM | POA: Diagnosis present

## 2022-05-01 DIAGNOSIS — R6520 Severe sepsis without septic shock: Secondary | ICD-10-CM | POA: Diagnosis present

## 2022-05-01 DIAGNOSIS — F05 Delirium due to known physiological condition: Secondary | ICD-10-CM | POA: Diagnosis not present

## 2022-05-01 DIAGNOSIS — E860 Dehydration: Secondary | ICD-10-CM | POA: Diagnosis present

## 2022-05-01 DIAGNOSIS — E871 Hypo-osmolality and hyponatremia: Secondary | ICD-10-CM | POA: Diagnosis present

## 2022-05-01 DIAGNOSIS — J9601 Acute respiratory failure with hypoxia: Secondary | ICD-10-CM | POA: Diagnosis present

## 2022-05-01 DIAGNOSIS — A419 Sepsis, unspecified organism: Secondary | ICD-10-CM

## 2022-05-01 DIAGNOSIS — Z515 Encounter for palliative care: Secondary | ICD-10-CM | POA: Diagnosis not present

## 2022-05-01 DIAGNOSIS — J441 Chronic obstructive pulmonary disease with (acute) exacerbation: Secondary | ICD-10-CM | POA: Diagnosis not present

## 2022-05-01 DIAGNOSIS — I214 Non-ST elevation (NSTEMI) myocardial infarction: Secondary | ICD-10-CM | POA: Diagnosis not present

## 2022-05-01 DIAGNOSIS — Z7189 Other specified counseling: Secondary | ICD-10-CM

## 2022-05-01 DIAGNOSIS — Z1152 Encounter for screening for COVID-19: Secondary | ICD-10-CM | POA: Diagnosis not present

## 2022-05-01 DIAGNOSIS — E785 Hyperlipidemia, unspecified: Secondary | ICD-10-CM | POA: Diagnosis present

## 2022-05-01 DIAGNOSIS — G20A1 Parkinson's disease without dyskinesia, without mention of fluctuations: Secondary | ICD-10-CM | POA: Diagnosis present

## 2022-05-01 DIAGNOSIS — Z789 Other specified health status: Secondary | ICD-10-CM | POA: Diagnosis present

## 2022-05-01 DIAGNOSIS — N179 Acute kidney failure, unspecified: Secondary | ICD-10-CM | POA: Diagnosis present

## 2022-05-01 DIAGNOSIS — G309 Alzheimer's disease, unspecified: Secondary | ICD-10-CM | POA: Diagnosis present

## 2022-05-01 DIAGNOSIS — Z66 Do not resuscitate: Secondary | ICD-10-CM | POA: Diagnosis present

## 2022-05-01 DIAGNOSIS — E1122 Type 2 diabetes mellitus with diabetic chronic kidney disease: Secondary | ICD-10-CM | POA: Diagnosis present

## 2022-05-01 DIAGNOSIS — R54 Age-related physical debility: Secondary | ICD-10-CM | POA: Diagnosis present

## 2022-05-01 DIAGNOSIS — I2510 Atherosclerotic heart disease of native coronary artery without angina pectoris: Secondary | ICD-10-CM

## 2022-05-01 DIAGNOSIS — J439 Emphysema, unspecified: Secondary | ICD-10-CM | POA: Diagnosis present

## 2022-05-01 DIAGNOSIS — A4189 Other specified sepsis: Secondary | ICD-10-CM | POA: Diagnosis present

## 2022-05-01 DIAGNOSIS — I4819 Other persistent atrial fibrillation: Secondary | ICD-10-CM | POA: Diagnosis present

## 2022-05-01 DIAGNOSIS — I5033 Acute on chronic diastolic (congestive) heart failure: Secondary | ICD-10-CM | POA: Diagnosis not present

## 2022-05-01 DIAGNOSIS — I130 Hypertensive heart and chronic kidney disease with heart failure and stage 1 through stage 4 chronic kidney disease, or unspecified chronic kidney disease: Secondary | ICD-10-CM | POA: Diagnosis present

## 2022-05-01 LAB — LACTIC ACID, PLASMA
Lactic Acid, Venous: 3.6 mmol/L (ref 0.5–1.9)
Lactic Acid, Venous: 1.8 mmol/L (ref 0.5–1.9)

## 2022-05-01 LAB — MAGNESIUM: Magnesium: 1.7 mg/dL (ref 1.7–2.4)

## 2022-05-01 LAB — COMPREHENSIVE METABOLIC PANEL
ALT: <5 U/L (ref 0–44)
ALT: 8 U/L (ref 0–44)
AST: 91 U/L — ABNORMAL HIGH (ref 15–41)
AST: 82 U/L — ABNORMAL HIGH (ref 15–41)
Albumin: 3.8 g/dL (ref 3.5–5.0)
Albumin: 3.3 g/dL — ABNORMAL LOW (ref 3.5–5.0)
Alkaline Phosphatase: 117 U/L (ref 38–126)
Alkaline Phosphatase: 106 U/L (ref 38–126)
Anion gap: 13 (ref 5–15)
Anion gap: 13 (ref 5–15)
BUN: 42 mg/dL — ABNORMAL HIGH (ref 8–23)
BUN: 45 mg/dL — ABNORMAL HIGH (ref 8–23)
CO2: 20 mmol/L — ABNORMAL LOW (ref 22–32)
CO2: 19 mmol/L — ABNORMAL LOW (ref 22–32)
Calcium: 9 mg/dL (ref 8.9–10.3)
Calcium: 8.7 mg/dL — ABNORMAL LOW (ref 8.9–10.3)
Chloride: 101 mmol/L (ref 98–111)
Chloride: 103 mmol/L (ref 98–111)
Creatinine, Ser: 2.2 mg/dL — ABNORMAL HIGH (ref 0.61–1.24)
Creatinine, Ser: 2.16 mg/dL — ABNORMAL HIGH (ref 0.61–1.24)
GFR, Estimated: 28 mL/min — ABNORMAL LOW (ref >60)
GFR, Estimated: 29 mL/min — ABNORMAL LOW (ref >60)
Glucose, Bld: 223 mg/dL — ABNORMAL HIGH (ref 70–99)
Glucose, Bld: 218 mg/dL — ABNORMAL HIGH (ref 70–99)
Potassium: 5.3 mmol/L — ABNORMAL HIGH (ref 3.5–5.1)
Potassium: 4.8 mmol/L (ref 3.5–5.1)
Sodium: 134 mmol/L — ABNORMAL LOW (ref 135–145)
Sodium: 135 mmol/L (ref 135–145)
Total Bilirubin: 0.4 mg/dL (ref 0.3–1.2)
Total Bilirubin: 0.4 mg/dL (ref 0.3–1.2)
Total Protein: 7.5 g/dL (ref 6.5–8.1)
Total Protein: 7 g/dL (ref 6.5–8.1)

## 2022-05-01 LAB — MRSA NEXT GEN BY PCR, NASAL: MRSA by PCR Next Gen: NOT DETECTED

## 2022-05-01 LAB — GLUCOSE, CAPILLARY
Glucose-Capillary: 276 mg/dL — ABNORMAL HIGH (ref 70–99)
Glucose-Capillary: 100 mg/dL — ABNORMAL HIGH (ref 70–99)
Glucose-Capillary: 154 mg/dL — ABNORMAL HIGH (ref 70–99)

## 2022-05-01 LAB — CBC
HCT: 32.5 % — ABNORMAL LOW (ref 39.0–52.0)
Hemoglobin: 10.4 g/dL — ABNORMAL LOW (ref 13.0–17.0)
MCH: 28 pg (ref 26.0–34.0)
MCHC: 32 g/dL (ref 30.0–36.0)
MCV: 87.4 fL (ref 80.0–100.0)
Platelets: 285 10*3/uL (ref 150–400)
RBC: 3.72 MIL/uL — ABNORMAL LOW (ref 4.22–5.81)
RDW: 14.7 % (ref 11.5–15.5)
WBC: 11.3 10*3/uL — ABNORMAL HIGH (ref 4.0–10.5)
nRBC: 0 % (ref 0.0–0.2)

## 2022-05-01 LAB — CBG MONITORING, ED
Glucose-Capillary: 270 mg/dL — ABNORMAL HIGH (ref 70–99)
Glucose-Capillary: 253 mg/dL — ABNORMAL HIGH (ref 70–99)

## 2022-05-01 LAB — TROPONIN I (HIGH SENSITIVITY)
Troponin I (High Sensitivity): 9511 ng/L (ref <18)
Troponin I (High Sensitivity): 11516 ng/L (ref <18)
Troponin I (High Sensitivity): 10343 ng/L (ref <18)

## 2022-05-01 LAB — HEPARIN LEVEL (UNFRACTIONATED)
Heparin Unfractionated: 0.19 IU/mL — ABNORMAL LOW (ref 0.30–0.70)
Heparin Unfractionated: 0.66 IU/mL (ref 0.30–0.70)

## 2022-05-01 LAB — RESP PANEL BY RT-PCR (FLU A&B, COVID) ARPGX2
Influenza A by PCR: NEGATIVE
Influenza B by PCR: NEGATIVE
SARS Coronavirus 2 by RT PCR: NEGATIVE

## 2022-05-01 LAB — PROCALCITONIN: Procalcitonin: 0.38 ng/mL

## 2022-05-01 LAB — BRAIN NATRIURETIC PEPTIDE: B Natriuretic Peptide: 3035.8 pg/mL — ABNORMAL HIGH (ref 0.0–100.0)

## 2022-05-01 LAB — STREP PNEUMONIAE URINARY ANTIGEN: Strep Pneumo Urinary Antigen: NEGATIVE

## 2022-05-01 MED ORDER — ONDANSETRON HCL 4 MG/2ML IJ SOLN: 4.0000 mg | Freq: Four times a day (QID) | INTRAMUSCULAR | Status: DC | PRN

## 2022-05-01 MED ORDER — HEPARIN (PORCINE) 25000 UT/250ML-% IV SOLN: 14.0000 [IU]/kg/h | INTRAVENOUS | Status: DC

## 2022-05-01 MED ORDER — INSULIN ASPART 100 UNIT/ML IJ SOLN
0.0000 [IU] | Freq: Every day | INTRAMUSCULAR | Status: DC
  Administered 2022-05-01: 3 [IU] via SUBCUTANEOUS
  Administered 2022-05-02 – 2022-05-05 (×2): 2 [IU] via SUBCUTANEOUS
  Filled 2022-05-01 (×3): qty 1

## 2022-05-01 MED ORDER — HEPARIN BOLUS VIA INFUSION
2100.0000 [IU] | Freq: Once | INTRAVENOUS | Status: AC
  Administered 2022-05-01: 2100 [IU] via INTRAVENOUS
  Filled 2022-05-01: qty 2100

## 2022-05-01 MED ORDER — ACETAMINOPHEN 650 MG RE SUPP: 650.0000 mg | Freq: Four times a day (QID) | RECTAL | Status: DC | PRN

## 2022-05-01 MED ORDER — HEPARIN SODIUM (PORCINE) 5000 UNIT/ML IJ SOLN: 60.0000 [IU]/kg | Freq: Once | INTRAMUSCULAR | Status: DC

## 2022-05-01 MED ORDER — DILTIAZEM HCL ER 420 MG PO TB24: 1.0000 | ORAL_TABLET | Freq: Every day | ORAL | Status: DC

## 2022-05-01 MED ORDER — LACTATED RINGERS IV BOLUS (SEPSIS): 250.0000 mL | Freq: Once | INTRAVENOUS | Status: DC

## 2022-05-01 MED ORDER — DILTIAZEM HCL ER COATED BEADS 240 MG PO CP24
240.0000 mg | ORAL_CAPSULE | Freq: Every day | ORAL | Status: DC
  Administered 2022-05-01 – 2022-05-03 (×3): 240 mg via ORAL
  Filled 2022-05-01 (×3): qty 1

## 2022-05-01 MED ORDER — DILTIAZEM HCL ER COATED BEADS 180 MG PO CP24
180.0000 mg | ORAL_CAPSULE | Freq: Every day | ORAL | Status: DC
  Administered 2022-05-01 – 2022-05-03 (×3): 180 mg via ORAL
  Filled 2022-05-01 (×3): qty 1

## 2022-05-01 MED ORDER — POLYETHYLENE GLYCOL 3350 17 G PO PACK: 17.0000 g | PACK | Freq: Every day | ORAL | Status: DC | PRN

## 2022-05-01 MED ORDER — LACTATED RINGERS IV BOLUS (SEPSIS)
1000.0000 mL | Freq: Once | INTRAVENOUS | Status: AC
  Administered 2022-05-01: 1000 mL via INTRAVENOUS

## 2022-05-01 MED ORDER — SODIUM CHLORIDE 0.9 % IV SOLN
2.0000 g | INTRAVENOUS | Status: DC
  Administered 2022-05-01 – 2022-05-02 (×3): 2 g via INTRAVENOUS
  Filled 2022-05-01 (×2): qty 2
  Filled 2022-05-01 (×2): qty 20

## 2022-05-01 MED ORDER — LABETALOL HCL 5 MG/ML IV SOLN
10.0000 mg | INTRAVENOUS | Status: DC | PRN
  Administered 2022-05-01: 10 mg via INTRAVENOUS
  Filled 2022-05-01: qty 4

## 2022-05-01 MED ORDER — ACETAMINOPHEN 325 MG PO TABS: 650.0000 mg | ORAL_TABLET | Freq: Four times a day (QID) | ORAL | Status: DC | PRN

## 2022-05-01 MED ORDER — IPRATROPIUM-ALBUTEROL 0.5-2.5 (3) MG/3ML IN SOLN
3.0000 mL | Freq: Four times a day (QID) | RESPIRATORY_TRACT | Status: DC
  Administered 2022-05-01 – 2022-05-02 (×7): 3 mL via RESPIRATORY_TRACT
  Filled 2022-05-01 (×8): qty 3

## 2022-05-01 MED ORDER — TRAZODONE HCL 50 MG PO TABS
25.0000 mg | ORAL_TABLET | Freq: Every evening | ORAL | Status: DC | PRN
  Administered 2022-05-01 – 2022-05-05 (×5): 25 mg via ORAL
  Filled 2022-05-01 (×5): qty 1

## 2022-05-01 MED ORDER — ONDANSETRON HCL 4 MG PO TABS: 4.0000 mg | ORAL_TABLET | Freq: Four times a day (QID) | ORAL | Status: DC | PRN

## 2022-05-01 MED ORDER — ASPIRIN 81 MG PO TBEC
81.0000 mg | DELAYED_RELEASE_TABLET | Freq: Every day | ORAL | Status: DC
  Administered 2022-05-01 – 2022-05-03 (×3): 81 mg via ORAL
  Filled 2022-05-01 (×3): qty 1

## 2022-05-01 MED ORDER — INSULIN ASPART 100 UNIT/ML IJ SOLN
0.0000 [IU] | Freq: Three times a day (TID) | INTRAMUSCULAR | Status: DC
  Administered 2022-05-01 (×2): 3 [IU] via SUBCUTANEOUS
  Administered 2022-05-02: 1 [IU] via SUBCUTANEOUS
  Administered 2022-05-02: 2 [IU] via SUBCUTANEOUS
  Administered 2022-05-02: 1 [IU] via SUBCUTANEOUS
  Administered 2022-05-03 (×2): 3 [IU] via SUBCUTANEOUS
  Administered 2022-05-03: 1 [IU] via SUBCUTANEOUS
  Administered 2022-05-04: 4 [IU] via SUBCUTANEOUS
  Administered 2022-05-04: 1 [IU] via SUBCUTANEOUS
  Administered 2022-05-04 – 2022-05-05 (×2): 2 [IU] via SUBCUTANEOUS
  Administered 2022-05-05 – 2022-05-06 (×3): 1 [IU] via SUBCUTANEOUS
  Filled 2022-05-01 (×14): qty 1

## 2022-05-01 MED ORDER — CARBIDOPA-LEVODOPA 25-100 MG PO TABS
1.0000 | ORAL_TABLET | Freq: Three times a day (TID) | ORAL | Status: DC
  Administered 2022-05-01 – 2022-05-06 (×15): 1 via ORAL
  Filled 2022-05-01 (×16): qty 1

## 2022-05-01 MED ORDER — HALOPERIDOL LACTATE 5 MG/ML IJ SOLN
1.0000 mg | Freq: Four times a day (QID) | INTRAMUSCULAR | Status: DC | PRN
  Administered 2022-05-01 – 2022-05-02 (×2): 2 mg via INTRAMUSCULAR
  Filled 2022-05-01 (×2): qty 1

## 2022-05-01 MED ORDER — SODIUM CHLORIDE 0.9 % IV SOLN
500.0000 mg | INTRAVENOUS | Status: DC
  Administered 2022-05-01 (×2): 500 mg via INTRAVENOUS
  Filled 2022-05-01: qty 5
  Filled 2022-05-01: qty 500

## 2022-05-01 MED ORDER — OXYCODONE HCL 5 MG PO TABS
5.0000 mg | ORAL_TABLET | ORAL | Status: DC | PRN
  Administered 2022-05-01 – 2022-05-05 (×5): 5 mg via ORAL
  Filled 2022-05-01 (×5): qty 1

## 2022-05-01 MED ORDER — HYDRALAZINE HCL 50 MG PO TABS: 25.0000 mg | ORAL_TABLET | Freq: Three times a day (TID) | ORAL | Status: DC

## 2022-05-01 MED ORDER — TAMSULOSIN HCL 0.4 MG PO CAPS
0.4000 mg | ORAL_CAPSULE | Freq: Every day | ORAL | Status: DC
  Administered 2022-05-01 – 2022-05-06 (×4): 0.4 mg via ORAL
  Filled 2022-05-01 (×5): qty 1

## 2022-05-01 MED ORDER — AMLODIPINE BESYLATE 5 MG PO TABS
5.0000 mg | ORAL_TABLET | Freq: Every day | ORAL | Status: DC
  Administered 2022-05-01 – 2022-05-06 (×6): 5 mg via ORAL
  Filled 2022-05-01 (×6): qty 1

## 2022-05-01 MED ORDER — PREDNISONE 20 MG PO TABS
40.0000 mg | ORAL_TABLET | Freq: Every day | ORAL | Status: AC
  Administered 2022-05-01 – 2022-05-05 (×5): 40 mg via ORAL
  Filled 2022-05-01 (×3): qty 2
  Filled 2022-05-01 (×2): qty 4

## 2022-05-01 MED ORDER — HYDRALAZINE HCL 50 MG PO TABS
50.0000 mg | ORAL_TABLET | Freq: Three times a day (TID) | ORAL | Status: DC
  Administered 2022-05-01 (×3): 50 mg via ORAL
  Filled 2022-05-01 (×3): qty 1

## 2022-05-01 MED ORDER — HEPARIN BOLUS VIA INFUSION
4000.0000 [IU] | Freq: Once | INTRAVENOUS | Status: AC
  Administered 2022-05-01: 4000 [IU] via INTRAVENOUS
  Filled 2022-05-01: qty 4000

## 2022-05-01 MED ORDER — HEPARIN (PORCINE) 25000 UT/250ML-% IV SOLN
1100.0000 [IU]/h | INTRAVENOUS | Status: AC
  Administered 2022-05-01: 1100 [IU]/h via INTRAVENOUS
  Administered 2022-05-01: 900 [IU]/h via INTRAVENOUS
  Administered 2022-05-01 – 2022-05-03 (×3): 1100 [IU]/h via INTRAVENOUS
  Filled 2022-05-01 (×4): qty 250

## 2022-05-01 MED ORDER — CHLORHEXIDINE GLUCONATE CLOTH 2 % EX PADS
6.0000 | MEDICATED_PAD | Freq: Every day | CUTANEOUS | Status: DC
  Administered 2022-05-01 – 2022-05-10 (×10): 6 via TOPICAL

## 2022-05-01 NOTE — Assessment & Plan Note (Signed)
Discussed code status with patient's family / surrogate decision makers.  They request DNR and confirm no CPR, no defibrillation, no ACLS meds, no intubation in the event of cardiac and or respiratory arrest.   --DNR order placed.

## 2022-05-01 NOTE — Consult Note (Signed)
CARDIOLOGY CONSULT NOTE               Patient ID: Dennis Serrano MRN: [id number] DOB/AGE: 1933-03-31 88 y.o.  Admit date: 05/07/2022 Referring Physician Dr. Clayton Lefort hospitalist Primary Physician Dr. Robby Sermon Primary Cardiologist [facility] Reason for Consultation acute respiratory failure toxemia  HPI: Patient is a 88 year old male Alzheimer's dementia Parkinson disease persistent atrial fibrillation diabetes COPD hyperlipidemia hypertension presents with acute shortness of breath patient's had a cough no fever denies backout spells or syncope.  Chest x-ray suggested possible early opacities possible pneumonia patient was placed on broad-spectrum antibiotic therapy cardiology was then consulted for tachycardia shortness of breath possible heart failure.  Patient has known coronary disease with a distant past no recent anginal symptoms  Review of systems complete and found to be negative unless listed above     Past Medical History:  Diagnosis Date   Anxiety    CAD (coronary artery disease)    Dementia ([place])    Diabetes mellitus without complication ([place])    GERD (gastroesophageal reflux disease)    History of heart artery stent    HLD (hyperlipidemia)    HTN (hypertension)    Parkinson disease    Prostate cancer ([place])    over 5 years ago   Squamous cell carcinoma of skin 06/08/2021   L vertex, EDC    Past Surgical History:  Procedure Laterality Date   APPENDECTOMY     CORONARY STENT PLACEMENT     EYE SURGERY     TRANSURETHRAL RESECTION OF PROSTATE      Medications Prior to Admission  Medication Sig Dispense Refill Last Dose   amLODipine (NORVASC) 5 MG tablet Take 5 mg by mouth daily.   05/14/2022   aspirin 325 MG tablet Take 1 tablet (325 mg total) by mouth daily. (Patient taking differently: Take 81 mg by mouth daily.) 30 tablet 0 05/15/2022   carbidopa-levodopa (SINEMET IR) 25-100 MG tablet Take 1 tablet by mouth 3 (three) times daily.   05/17/2022    dilTIAZem HCl ER 420 MG TB24 Take 1 tablet by mouth daily.   05/14/2022   hydrALAZINE (APRESOLINE) 25 MG tablet Take 25 mg by mouth 3 (three) times daily.   05/04/2022   lisinopril (PRINIVIL,ZESTRIL) 40 MG tablet Take 40 mg by mouth daily.   05/21/2022   metFORMIN (GLUCOPHAGE) 1000 MG tablet Take 1,000 mg by mouth daily with breakfast.   04/29/2022   spironolactone (ALDACTONE) 25 MG tablet Take 25 mg by mouth daily.   05/13/2022   tamsulosin (FLOMAX) 0.4 MG CAPS capsule Take 0.4 mg by mouth daily.   05/07/2022   acetaminophen (TYLENOL) 500 MG tablet Take 500 mg by mouth every 8 (eight) hours.   prn at prn   celecoxib (CELEBREX) 200 MG capsule Take 200 mg by mouth daily. (Patient not taking: Reported on 05/01/2022)   Not Taking   cloNIDine (CATAPRES) 0.1 MG tablet Take 0.1 mg by mouth daily. (Patient not taking: Reported on 05/01/2022)   Not Taking   gemfibrozil (LOPID) 600 MG tablet  (Patient not taking: Reported on 05/01/2022)   Not Taking   mupirocin ointment (BACTROBAN) 2 % Apply once to twice a day to affected area scalp until healed. (Patient not taking: Reported on 05/01/2022) 22 g 0 Not Taking   ondansetron (ZOFRAN ODT) 4 MG disintegrating tablet Take 1 tablet (4 mg total) by mouth every 8 (eight) hours as needed for nausea or vomiting. (Patient not taking: Reported on 05/01/2022) 20 tablet 0  Not Taking   Social History   Socioeconomic History   Marital status: Widowed    Spouse name: Not on file   Number of children: Not on file   Years of education: Not on file   Highest education level: Not on file  Occupational History   Not on file  Tobacco Use   Smoking status: Former    Types: Cigarettes    Quit date: 1980    Years since quitting: 43.8   Smokeless tobacco: Never  Substance and Sexual Activity   Alcohol use: No   Drug use: No   Sexual activity: Not on file  Other Topics Concern   Not on file  Social History Narrative   Not on file   Social Determinants of Health   Financial  Resource Strain: Not on file  Food Insecurity: Not on file  Transportation Needs: Not on file  Physical Activity: Not on file  Stress: Not on file  Social Connections: Not on file  Intimate Partner Violence: Not on file    Family History  Problem Relation Age of Onset   Heart disease Mother    Heart disease Father    Cancer Sister    Cancer Brother       Review of systems complete and found to be negative unless listed above      PHYSICAL EXAM  General: Well developed, well nourished, in no acute distress HEENT:  Normocephalic and atramatic Neck:  No JVD.  Lungs: Clear bilaterally to auscultation and percussion. Heart: HRRR . Normal S1 and S2 without gallops or 3/6 sem murmurs.  Abdomen: Bowel sounds are positive, abdomen soft and non-tender  Msk:  Back normal, normal gait. Normal strength and tone for age. Extremities: No clubbing, cyanosis or edema.   Neuro: Generalized weakness fatigue alert oriented Psych:  Good affect, responds appropriately  Labs:   Lab Results  Component Value Date   WBC 11.3 (H) 05/01/2022   HGB 10.4 (L) 05/01/2022   HCT 32.5 (L) 05/01/2022   MCV 87.4 05/01/2022   PLT 285 05/01/2022    Recent Labs  Lab 05/01/22 0223  NA 135  K 4.8  CL 103  CO2 19*  BUN 45*  CREATININE 2.16*  CALCIUM 8.7*  PROT 7.0  BILITOT 0.4  ALKPHOS 106  ALT 8  AST 82*  GLUCOSE 218*   Lab Results  Component Value Date   TROPONINI <0.03 09/02/2018    Lab Results  Component Value Date   CHOL 235 (H) 10/21/2016   Lab Results  Component Value Date   HDL 28 (L) 10/21/2016   Lab Results  Component Value Date   LDLCALC 160 (H) 10/21/2016   Lab Results  Component Value Date   TRIG 237 (H) 10/21/2016   Lab Results  Component Value Date   CHOLHDL 8.4 10/21/2016   No results found for: "LDLDIRECT"    Radiology: [facility] Chest Port 1 View  Result Date: 05/01/2022 CLINICAL DATA:  Respiratory distress EXAM: PORTABLE CHEST 1 VIEW COMPARISON:  Radiograph  08/24/2017 FINDINGS: Hazy airspace and interstitial opacities greatest in the right mid and lower lung. Possible small right pleural effusion. No pneumothorax. Stable cardiomediastinal silhouette. Aortic atherosclerotic calcification. No acute osseous abnormality. IMPRESSION: Nonspecific hazy opacities greatest in the right mid and lower lung can be seen with infection or edema. Electronically Signed   By: Placido Sou M.D.   On: 05/25/2022 23:38    EKG: Sinus tachycardia rate around 100 nonspecific ST-T wave changes  ASSESSMENT  AND PLAN:  Acute hypoxic respiratory failure Community-acquired pneumonia Severe sepsis Hypertension Acute renal insufficiency Elevated troponin probable non-STEMI COPD Diabetes Parkinson's Atrial fibrillation persistent . Plan Agree admit to telemetry rule out microinfarction follow-up EKGs and troponins Continue supplemental oxygen inhalers as necessary for COPD shortness of breath hypoxemia Broad-spectrum antibiotic therapy for possible sepsis Continue Parkinson medication We will diabetes management and control Anticoagulation for atrial fibrillation as well as rate control Adequate hydration for acute renal insufficiency follow-up with nephrology Agree with echocardiogram to assess left ventricular function wall motion No invasive cardiac procedures planned at this stage  Signed: Yolonda Kida MD 05/01/2022, 1:32 PM

## 2022-05-01 NOTE — IPAL (Signed)
  Interdisciplinary Goals of Care Family Meeting   Date carried out: 05/01/2022  Location of the meeting: Bedside   Member's involved: Physician, Bedside Registered Nurse, and Family Member or next of kin   Durable Power of Attorney or acting medical decision maker: Lucretia Field (daughter and legal guardian), Donald Pore (granddaughter and legal guardian)    Discussion: We discussed goals of care for [employer] .   [name] and [place] both in agreement about making patient DNR, given his age, co-morbidities, severity of illness and declining quality of life and cognition prior to this hospitalization.   Code status: Full DNR  Disposition: Continue current acute care  Time spent for the meeting: 20 minutes    Ezekiel Slocumb, DO  05/01/2022, 1:43 PM

## 2022-05-01 NOTE — Progress Notes (Signed)
CODE SEPSIS - PHARMACY COMMUNICATION  **Broad Spectrum Antibiotics should be administered within 1 hour of Sepsis diagnosis**  Time Code Sepsis Called/Page Received: 0005  Antibiotics Ordered: Azithromycin & Ceftriaxone  Time of 1st antibiotic administration: 0118  Additional action taken by pharmacy: Contacted RN [phone] regarding abx administration  If necessary, Name of Provider/Nurse Contacted: H. Liane Comber., RN  Renda Rolls, PharmD, [facility] 05/01/2022 12:06 AM

## 2022-05-01 NOTE — Assessment & Plan Note (Signed)
POA with tachypnea, hypoxia, tachycardia, and evidence of endorgan damage with lactic acidosis and AKI.  Sepsis protocol initiated. - Antibiotics per CAP protocol - Follow-up urine and blood cultures

## 2022-05-01 NOTE — Progress Notes (Signed)
ANTICOAGULATION CONSULT NOTE  Pharmacy Consult for heparin infusion Indication: ACS/STEMI  No Known Allergies  Patient Measurements:   Heparin Dosing Weight: 69.8 kg  Vital Signs: Temp: 97.2 F (36.2 C) (11/04 2322) Temp Source: Axillary (11/04 2322) BP: 162/119 (11/05 0000) Pulse Rate: 102 (11/05 0000)  Labs: Recent Labs    05/26/2022 2326  HGB 11.1*  HCT 34.7*  PLT 263  CREATININE 2.20*  TROPONINIHS 9,511*    CrCl cannot be calculated (Unknown ideal weight.).   Medical History: Past Medical History:  Diagnosis Date   Anxiety    CAD (coronary artery disease)    Dementia ([place])    Diabetes mellitus without complication (HCC)    GERD (gastroesophageal reflux disease)    History of heart artery stent    HLD (hyperlipidemia)    HTN (hypertension)    Parkinson disease    Prostate cancer ([place])    over 5 years ago   Squamous cell carcinoma of skin 06/08/2021   L vertex, EDC    Assessment: Pt is a 88 yo male presenting to ED w/ respiratory distress found with elevated troponin I and diagnoses w/ nSTEMI.  Goal of Therapy:  Heparin level 0.3-0.7 units/ml Monitor platelets by anticoagulation protocol: Yes   Plan:  Bolus 4000 units x 1 Start heparin infusion at 900 units/hr Will check HL in 8 hr after start of infusion CBC daily while on heparin  Renda Rolls, PharmD, [facility] 05/01/2022 12:08 AM

## 2022-05-01 NOTE — Progress Notes (Signed)
Same day as admission, brief rounding note.  Dennis Serrano is a 88 y.o. male with medical history significant for CAD, Alzheimer's disease, tension, hyperlipidemia, Parkinson's, persistent A-fib, type 2 diabetes, COPD, who presented to the ED around 11 pm on 05/12/2022 for evaluation of worsening shortness of breath.   Patient was admitted after midnight, started on IV antibiotics for working diagnosis of pneumonia, and IV heparin for possible ACS, with hs-troponin elevated to peak above 11k in the absence of chest pain.   Interval history:  Pt seen in ED holding for a bed this AM.  Seen later with daughter and granddaughter at bedside in ICU. Pt denies having current or recent chest pain.  He denies feeling short of breath, fever chills or other acute complaints.  He admits to feeling generally weak, but no other specific symptoms.  When I asked who he lives with, he answers "okay, sure".    Physical exam:  General exam: awake, alert, no acute distress, chronically ill-appearing, confused HEENT: scattered Sk's on face neck and head, moist mucus membranes, hard of hearing  Respiratory system: diminished breath sounds, intermittent wheezes, normal respiratory effort, on 12 L/min HFNC O2, tachypneic. Cardiovascular system: normal S1/S2, RRR, no pedal edema.   Gastrointestinal system: soft, NT, ND, no HSM felt, +bowel sounds. Central nervous system: limited exam as pt does not reliably follow commands or answer questions, no gross focal neurologic deficits, normal speech Extremities: moves all, no edema, normal tone Skin: dry, intact, normal temperature, scattered Sk's noted Psychiatry: normal mood, congruent affect, judgement and insight appear normal    Assessment & Plan:    Per H&P by Dr. Dione Plover, and per Cardiology.   Any changes or additions to plan as outlined below:  Chest xray reviewed and it, along with very high BNP brings concern for volume overload and acute heart failure.     --d/c additional fluid orders  --Echo pending --Cardiology recommendations pending --Continue IV antibiotics --Will consider diuresis but await cardiology input --IV Labetalol PRN ordered if BP elevated despite scheduled meds --Hydralazine dose increased to 50 mg TID (from 25 mg) --Follow up respiratory viral panel --Wean down O2 as sats allow, goal spO2 > 88%    No charge

## 2022-05-01 NOTE — Progress Notes (Signed)
Patient keeps pulling bipap off however c/o difficulty breathing. BBS coarse with rales. Attempted bipap again with this patient when called by RN.  Patient utilized therapy for appx 5 min before asking to take off then eventually pulling off himself.  Placed patient on high flow bubble cannula at 12 liters. O2 saturation noted at 93-94% bipap on standby.

## 2022-05-01 NOTE — Progress Notes (Signed)
[facility] for heparin infusion Indication: ACS/STEMI  No Known Allergies  Patient Measurements: Height: '5\' 9"'$  (175.3 cm) Weight: 69.8 kg (153 lb 14.1 oz) IBW/kg (Calculated) : 70.7 Heparin Dosing Weight: 69.8 kg  Vital Signs: Temp: 97.1 F (36.2 C) (11/05 0337) Temp Source: Oral (11/05 0337) BP: 189/96 (11/05 0430) Pulse Rate: 109 (11/05 0430)  Labs: Recent Labs    04/27/2022 2326 05/01/22 0223 05/01/22 0649  HGB 11.1* 10.4*  --   HCT 34.7* 32.5*  --   PLT 263 285  --   HEPARINUNFRC  --   --  0.19*  CREATININE 2.20* 2.16*  --   TROPONINIHS 9,511* 11,516* 10,343*     Estimated Creatinine Clearance: 23.8 mL/min (A) (by C-G formula based on SCr of 2.16 mg/dL (H)).   Medical History: Past Medical History:  Diagnosis Date   Anxiety    CAD (coronary artery disease)    Dementia ([place])    Diabetes mellitus without complication (HCC)    GERD (gastroesophageal reflux disease)    History of heart artery stent    HLD (hyperlipidemia)    HTN (hypertension)    Parkinson disease    Prostate cancer ([place])    over 5 years ago   Squamous cell carcinoma of skin 06/08/2021   L vertex, EDC    Assessment: Pt is a 88 yo male presenting to ED w/ respiratory distress found with elevated troponin I and diagnoses w/ nSTEMI.  11/5  0649  HL=0.19 Subtherapeutic  inc drip 900 to 1100 u/hr  Goal of Therapy:  Heparin level 0.3-0.7 units/ml Monitor platelets by anticoagulation protocol: Yes   Plan:  11/5  0649  HL=0.19 Subtherapeutic   drawn ~5 hrs after drip started instead of 8 hrs Bolus 2100 units x 1 increase heparin infusion to 1100 units/hr Will check HL in 8 hr after rate change CBC daily while on heparin  Chinita [place] PharmD Clinical Pharmacist 05/01/2022

## 2022-05-01 NOTE — Progress Notes (Signed)
[facility] for heparin infusion Indication: ACS/STEMI  No Known Allergies  Patient Measurements: Height: '5\' 9"'$  (175.3 cm) Weight: 68.9 kg (151 lb 14.4 oz) IBW/kg (Calculated) : 70.7 Heparin Dosing Weight: 69.8 kg  Vital Signs: Temp: 98.2 F (36.8 C) (11/05 1600) Temp Source: Oral (11/05 1600) BP: 126/67 (11/05 1800) Pulse Rate: 75 (11/05 1800)  Labs: Recent Labs    05/15/2022 2326 05/01/22 0223 05/01/22 0649 05/01/22 1759  HGB 11.1* 10.4*  --   --   HCT 34.7* 32.5*  --   --   PLT 263 285  --   --   HEPARINUNFRC  --   --  0.19* 0.66  CREATININE 2.20* 2.16*  --   --   TROPONINIHS 9,511* 11,516* 10,343*  --      Estimated Creatinine Clearance: 23.5 mL/min (A) (by C-G formula based on SCr of 2.16 mg/dL (H)).   Medical History: Past Medical History:  Diagnosis Date   Anxiety    CAD (coronary artery disease)    Dementia ([place])    Diabetes mellitus without complication (HCC)    GERD (gastroesophageal reflux disease)    History of heart artery stent    HLD (hyperlipidemia)    HTN (hypertension)    Parkinson disease    Prostate cancer ([place])    over 5 years ago   Squamous cell carcinoma of skin 06/08/2021   L vertex, EDC    Assessment: Pt is a 88 yo male presenting to ED w/ respiratory distress found with elevated troponin I and diagnoses w/ nSTEMI.  11/5  0649  HL=0.19 Subtherapeutic  inc drip 900 to 1100 u/hr 11/5 1759   HL=0.66 Therapeutic x1; 1100 un/hr  Goal of Therapy:  Heparin level 0.3-0.7 units/ml Monitor platelets by anticoagulation protocol: Yes   Plan:  11/5  1759  HL=0.66 therapeutic   x1 continue heparin infusion at 1100 units/hr Will recheck HL in 8 hr to confirm therapeutic; if consecutively therapeutic switch to daily lab  CBC daily while on heparin gtt  Lorna Dibble, PharmD, [facility] Clinical Pharmacist 05/01/2022 6:24 PM

## 2022-05-01 NOTE — Assessment & Plan Note (Signed)
No chest pain and no significant EKG changes.  Last echocardiogram done in April 2018, mild LVH was only abnormality seen. - Trend troponin - Echocardiogram in a.m. - Consult cardiology in a.m. - Heparin per pharmacy consult - Follow-up BNP

## 2022-05-01 NOTE — Progress Notes (Signed)
Pt being followed by [facility] for Sepsis protocol. 

## 2022-05-01 NOTE — Assessment & Plan Note (Addendum)
Ceftriaxone and azithromycin - continue Respiratory viral panel +Rhinovirus Supportive care with antitussives PRN, pulmonary hygiene, supplement O2 SLP consult for swallow evaluation

## 2022-05-01 NOTE — Assessment & Plan Note (Signed)
Prior nylon chest CTs that have captured partial imaging of the lung showed severe emphysema.  Diffuse wheezing with moderately poor air movement appreciated on exam.  Has already been given IV Solu-Medrol and started on BiPAP. - Antibiotics per Treatment algorithm - P.o. prednisone x5 days

## 2022-05-01 NOTE — Assessment & Plan Note (Signed)
Hypertension-continue amlodipine, diltiazem, hydralazine.  Hold lisinopril and spironolactone in setting of AKI. CAD-continue aspirin 81 mg Parkinson's-continue Sinemet DM2-sliding scale insulin correction factor BPH-continue Flomax

## 2022-05-01 NOTE — Assessment & Plan Note (Addendum)
Cr on admission 2.20, up from 1.54 last year, 1.15 two years ago.  Likely prerenal in setting of sepsis and NSTEMI --Treated with fluid resuscitation which has been stopped for concern of volume overload -- Hold lisinopril and spironolactone -- Avoid nephrotoxins -- Daily BMP

## 2022-05-01 NOTE — Progress Notes (Signed)
  Echocardiogram 2D Echocardiogram has been performed.  Claretta Fraise 05/01/2022, 2:58 PM

## 2022-05-01 NOTE — H&P (Signed)
History and Physical    Patient: Dennis Serrano OEV:[id number] DOB: 1933/05/23 DOA: 05/09/2022 DOS: the patient was seen and examined on 05/01/2022 PCP: Juline Patch, MD  Patient coming from: Home  Chief Complaint:  Chief Complaint  Patient presents with   Respiratory Distress    From home, Family noted, difficulty breathing, Sats 66% on RA, EMS gave DuoNeb/ Albuterol/ Solumed   HPI: Dennis Serrano is a 88 y.o. male with medical history significant for CAD, Alzheimer's disease, tension, hyperlipidemia, Parkinson's, persistent A-fib, type 2 diabetes, COPD, who presents with shortness of breath.  On my interview patient reports that he is feeling short of breath and has had a cough (unable to give a timeframe) but denies any other symptoms.  Interview somewhat limited by patient wearing BiPAP mask and not seeming to understand my questions either.  Per ED provider signout and review of chart he was brought from EMS by home for the above symptoms.  He was significantly hypoxic on route and so was placed on BiPAP, given DuoNebs, 125 mg IV Solu-Medrol, all prior to arrival.  In the ED vital signs notable for tachycardia in the low 100s, tachypnea into the 20s and 30s, hypoxia into the 60s, and hypertension.  CMP notable for potassium 5.3, glucose 223, creatinine 2.2, AST 91.  CBC showed leukocytosis with WBC 13.6, hemoglobin 11.1, normal platelets.  Lactic acid 3.6, procalcitonin 0.38, troponin 9511.  EKG did not show any acute ischemic changes or significant changes compared to prior. COVID and influenza swabs were negative.  Chest x-ray was obtained which showed hazy opacities concentrated in the right middle and lower lung, differential infection versus edema.  He was ordered for azithromycin, ceftriaxone, and a heparin drip, and admitted for further management.  Review of Systems: unable to review all systems due to the inability of the patient to answer questions. Past Medical History:   Diagnosis Date   Anxiety    CAD (coronary artery disease)    Dementia ([place])    Diabetes mellitus without complication (HCC)    GERD (gastroesophageal reflux disease)    History of heart artery stent    HLD (hyperlipidemia)    HTN (hypertension)    Parkinson disease    Prostate cancer ([place])    over 5 years ago   Squamous cell carcinoma of skin 06/08/2021   L vertex, EDC   Past Surgical History:  Procedure Laterality Date   APPENDECTOMY     CORONARY STENT PLACEMENT     EYE SURGERY     TRANSURETHRAL RESECTION OF PROSTATE     Social History:  reports that he quit smoking about 43 years ago. His smoking use included cigarettes. He has never used smokeless tobacco. He reports that he does not drink alcohol and does not use drugs.  No Known Allergies  Family History  Problem Relation Age of Onset   Heart disease Mother    Heart disease Father    Cancer Sister    Cancer Brother     Prior to Admission medications   Medication Sig Start Date End Date Taking? Authorizing Provider  acetaminophen (TYLENOL) 500 MG tablet Take 500 mg by mouth every 8 (eight) hours.    [provider]  aspirin 325 MG tablet Take 1 tablet (325 mg total) by mouth daily. Patient taking differently: Take 81 mg by mouth daily.  10/08/16   [name], MD  carbidopa-levodopa (SINEMET IR) 25-100 MG tablet Take 1 tablet by mouth 3 (three) times daily.  [provider]  doxycycline (VIBRA-TABS) 100 MG tablet Take 1 tablet twice a day with with food x 7 days 06/14/21   Ralene Bathe, MD  furosemide (LASIX) 20 MG tablet Take 20 mg by mouth daily. Patient not taking: Reported on 01/13/2020 08/20/18   [provider]  lisinopril (PRINIVIL,ZESTRIL) 40 MG tablet Take 40 mg by mouth daily.    [provider]  metFORMIN (GLUCOPHAGE) 1000 MG tablet Take 1,000 mg by mouth 2 (two) times daily with a meal.    [provider]  mupirocin ointment (BACTROBAN) 2 % Apply  once to twice a day to affected area scalp until healed. 06/08/21   Brendolyn Patty, MD  ondansetron (ZOFRAN ODT) 4 MG disintegrating tablet Take 1 tablet (4 mg total) by mouth every 8 (eight) hours as needed for nausea or vomiting. 11/21/20   [name], MD  spironolactone (ALDACTONE) 25 MG tablet Take 25 mg by mouth daily. 11/10/19   [provider]  tamsulosin (FLOMAX) 0.4 MG CAPS capsule Take 0.4 mg by mouth daily. 12/31/19   [provider]    Physical Exam: Vitals:   05/09/2022 2322 05/25/2022 2330 05/01/22 0000 05/01/22 0051  BP:  (!) 190/110 (!) 162/119   Pulse:  (!) 106 (!) 102   Resp:  (!) 30 17   Temp: (!) 97.2 F (36.2 C)     TempSrc: Axillary     SpO2:  100% 98%   Weight:    69.8 kg  Height:    '5\' 9"'$  (1.753 m)   Physical Exam Constitutional:      Appearance: He is ill-appearing. He is not diaphoretic.  Eyes:     General: No scleral icterus. Cardiovascular:     Rate and Rhythm: Regular rhythm. Tachycardia present.     Heart sounds: Normal heart sounds.  Pulmonary:     Effort: Accessory muscle usage, prolonged expiration and respiratory distress present.     Breath sounds: No decreased air movement. Wheezing and rhonchi present. No decreased breath sounds or rales.     Comments: BIPAP in place. Increased work of breathing. Moderate air movement throughout. Inspiratory and expiratory wheezes/rhonchi throughout lung fields Abdominal:     General: Abdomen is flat. Bowel sounds are normal.     Palpations: Abdomen is soft.  Musculoskeletal:     Right lower leg: No edema.     Left lower leg: No edema.  Skin:    General: Skin is warm and dry.  Neurological:     Mental Status: He is alert.      Data Reviewed:  Results for orders placed or performed during the hospital encounter of 05/21/2022 (from the past 24 hour(s))  Lactic acid, plasma     Status: Abnormal   Collection Time: 05/02/2022 11:26 PM  Result Value Ref Range   Lactic Acid, Venous 3.6 (HH)  0.5 - 1.9 mmol/L  CBC with Differential     Status: Abnormal   Collection Time: 05/05/2022 11:26 PM  Result Value Ref Range   WBC 13.6 (H) 4.0 - 10.5 K/uL   RBC 3.96 (L) 4.22 - 5.81 MIL/uL   Hemoglobin 11.1 (L) 13.0 - 17.0 g/dL   HCT 34.7 (L) 39.0 - 52.0 %   MCV 87.6 80.0 - 100.0 fL   MCH 28.0 26.0 - 34.0 pg   MCHC 32.0 30.0 - 36.0 g/dL   RDW 14.7 11.5 - 15.5 %   Platelets 263 150 - 400 K/uL   nRBC 0.0 0.0 - 0.2 %  Neutrophils Relative % 89 %   Neutro Abs 12.1 (H) 1.7 - 7.7 K/uL   Lymphocytes Relative 5 %   Lymphs Abs 0.7 0.7 - 4.0 K/uL   Monocytes Relative 5 %   Monocytes Absolute 0.7 0.1 - 1.0 K/uL   Eosinophils Relative 0 %   Eosinophils Absolute 0.0 0.0 - 0.5 K/uL   Basophils Relative 0 %   Basophils Absolute 0.0 0.0 - 0.1 K/uL   Immature Granulocytes 1 %   Abs Immature Granulocytes 0.09 (H) 0.00 - 0.07 K/uL  Comprehensive metabolic panel     Status: Abnormal   Collection Time: 05/06/2022 11:26 PM  Result Value Ref Range   Sodium 134 (L) 135 - 145 mmol/L   Potassium 5.3 (H) 3.5 - 5.1 mmol/L   Chloride 101 98 - 111 mmol/L   CO2 20 (L) 22 - 32 mmol/L   Glucose, Bld 223 (H) 70 - 99 mg/dL   BUN 42 (H) 8 - 23 mg/dL   Creatinine, Ser 2.20 (H) 0.61 - 1.24 mg/dL   Calcium 9.0 8.9 - 10.3 mg/dL   Total Protein 7.5 6.5 - 8.1 g/dL   Albumin 3.8 3.5 - 5.0 g/dL   AST 91 (H) 15 - 41 U/L   ALT <5 0 - 44 U/L   Alkaline Phosphatase 117 38 - 126 U/L   Total Bilirubin 0.4 0.3 - 1.2 mg/dL   GFR, Estimated 28 (L) >60 mL/min   Anion gap 13 5 - 15  Troponin I (High Sensitivity)     Status: Abnormal   Collection Time: 05/22/2022 11:26 PM  Result Value Ref Range   Troponin I (High Sensitivity) 9,511 (HH) <18 ng/L  Blood gas, arterial     Status: Abnormal   Collection Time: 05/08/2022 11:26 PM  Result Value Ref Range   FIO2 30 %   Delivery systems BILEVEL POSITIVE AIRWAY PRESSURE    Inspiratory PAP 10 cmH2O   Expiratory PAP 5 cmH2O   pH, Arterial 7.38 7.35 - 7.45   pCO2 arterial 30 (L) 32  - 48 mmHg   pO2, Arterial 105 83 - 108 mmHg   Bicarbonate 17.7 (L) 20.0 - 28.0 mmol/L   Acid-base deficit 6.2 (H) 0.0 - 2.0 mmol/L   O2 Saturation 99.3 %   Patient temperature 37.0    Collection site RIGHT RADIAL    Allens test (pass/fail) PASS PASS  Resp Panel by RT-PCR (Flu A&B, Covid) Anterior Nasal Swab     Status: None   Collection Time: 05/07/2022 11:26 PM   Specimen: Anterior Nasal Swab  Result Value Ref Range   SARS Coronavirus 2 by RT PCR NEGATIVE NEGATIVE   Influenza A by PCR NEGATIVE NEGATIVE   Influenza B by PCR NEGATIVE NEGATIVE  Procalcitonin - Baseline     Status: None   Collection Time: 05/03/2022 11:26 PM  Result Value Ref Range   Procalcitonin 0.38 ng/mL  Magnesium     Status: None   Collection Time: 05/08/2022 11:26 PM  Result Value Ref Range   Magnesium 1.7 1.7 - 2.4 mg/dL    DG Chest Port 1 View CLINICAL DATA:  Respiratory distress  EXAM: PORTABLE CHEST 1 VIEW  COMPARISON:  Radiograph 08/24/2017  FINDINGS: Hazy airspace and interstitial opacities greatest in the right mid and lower lung. Possible small right pleural effusion. No pneumothorax. Stable cardiomediastinal silhouette. Aortic atherosclerotic calcification. No acute osseous abnormality.  IMPRESSION: Nonspecific hazy opacities greatest in the right mid and lower lung can be seen with infection or edema.  Electronically Signed   By: Placido Sou M.D.   On: 04/29/2022 23:38  ECG (my read): Sinus tachycardia with some PACs, T wave inversion in 1 and aVL, poor R wave progression, no acute ischemic changes and no significant change compared to prior  Assessment and Plan: Gerardo Caiazzo is a 88 y.o. male with medical history significant for CAD, Alzheimer's disease, tension, hyperlipidemia, Parkinson's, persistent A-fib, type 2 diabetes, COPD, who presents with shortness of breath and was found to have severe sepsis and NSTEMI.  * Acute hypoxemic respiratory failure (HCC) Suspected to be  secondary to CAP mixed with COPD exacerbation, although given elevated troponin clinical picture is not totally clear.  Procalcitonin is on the lower side of 0.38 which would argue against infection, however chest x-ray is suggestive of infiltrate.  Currently requiring BiPAP for respiratory support.  PE is another consideration, likely he is already being covered with anticoagulation due to NSTEMI. - Wean BiPAP as tolerated - Follow-up BNP to help further clarify etiology of respiratory failure - Consider chest CT, he has not had this previously  CAP (community acquired pneumonia) Ceftriaxone and azithromycin per protocol Respiratory viral panel pending  Severe sepsis [facility]) ED criteria with tachypnea, hypoxia, tachycardia, and evidence of endorgan damage with lactic acidosis and AKI.  Sepsis protocol initiated. - Antibiotics per CAP protocol - Follow-up urine and blood cultures  Known medical problems Hypertension-continue amlodipine, diltiazem, hydralazine.  Hold lisinopril and spironolactone in setting of AKI. CAD-continue aspirin 81 mg Parkinson's-continue Sinemet DM2-sliding scale insulin correction factor BPH-continue Flomax  AKI (acute kidney injury) ([place]) Likely prerenal in setting of sepsis and NSTEMI, currently undergoing fluid resuscitation, trend BMP.  Hold lisinopril and spironolactone.  NSTEMI (non-ST elevated myocardial infarction) (HCC) No chest pain and no significant EKG changes.  Last echocardiogram done in April 2018, mild LVH was only abnormality seen. - Trend troponin - Echocardiogram in a.m. - Consult cardiology in a.m. - Heparin per pharmacy consult - Follow-up BNP  COPD with acute exacerbation (HCC) Prior nylon chest CTs that have captured partial imaging of the lung showed severe emphysema.  Diffuse wheezing with moderately poor air movement appreciated on exam.  Has already been given IV Solu-Medrol and started on BiPAP. - Antibiotics per Treatment  algorithm - P.o. prednisone x5 days      Advance Care Planning:   Code Status: Full Code   Consults: Cardiology in AM  Family Communication: None  Severity of Illness: The appropriate patient status for this patient is INPATIENT. Inpatient status is judged to be reasonable and necessary in order to provide the required intensity of service to ensure the patient's safety. The patient's presenting symptoms, physical exam findings, and initial radiographic and laboratory data in the context of their chronic comorbidities is felt to place them at high risk for further clinical deterioration. Furthermore, it is not anticipated that the patient will be medically stable for discharge from the hospital within 2 midnights of admission.   * I certify that at the point of admission it is my clinical judgment that the patient will require inpatient hospital care spanning beyond 2 midnights from the point of admission due to high intensity of service, high risk for further deterioration and high frequency of surveillance required.*  Author: Clarnce Flock, MD 05/01/2022 1:11 AM  For on call review [email or web address].

## 2022-05-01 NOTE — Assessment & Plan Note (Addendum)
Suspected to be secondary to CAP mixed with COPD exacerbation, although given elevated troponin clinical picture is not totally clear.  Procalcitonin is on the lower side of 0.38 which would argue against infection, however chest x-ray is suggestive of infiltrate.  Currently requiring BiPAP for respiratory support.  PE is another consideration, likely he is already being covered with anticoagulation due to NSTEMI. - Wean BiPAP as tolerated - Follow-up BNP to help further clarify etiology of respiratory failure - Consider chest CT, he has not had this previously

## 2022-05-02 ENCOUNTER — Inpatient Hospital Stay: Payer: Medicare HMO

## 2022-05-02 DIAGNOSIS — J189 Pneumonia, unspecified organism: Secondary | ICD-10-CM | POA: Diagnosis not present

## 2022-05-02 DIAGNOSIS — F05 Delirium due to known physiological condition: Secondary | ICD-10-CM | POA: Insufficient documentation

## 2022-05-02 DIAGNOSIS — J9601 Acute respiratory failure with hypoxia: Secondary | ICD-10-CM | POA: Diagnosis not present

## 2022-05-02 DIAGNOSIS — I214 Non-ST elevation (NSTEMI) myocardial infarction: Secondary | ICD-10-CM | POA: Diagnosis not present

## 2022-05-02 DIAGNOSIS — N179 Acute kidney failure, unspecified: Secondary | ICD-10-CM | POA: Diagnosis not present

## 2022-05-02 LAB — RESPIRATORY PANEL BY PCR

## 2022-05-02 LAB — HEPARIN LEVEL (UNFRACTIONATED): Heparin Unfractionated: 0.64 IU/mL (ref 0.30–0.70)

## 2022-05-02 LAB — CBC
HCT: 27.8 % — ABNORMAL LOW (ref 39.0–52.0)
Hemoglobin: 9.2 g/dL — ABNORMAL LOW (ref 13.0–17.0)
MCH: 28.1 pg (ref 26.0–34.0)
MCHC: 33.1 g/dL (ref 30.0–36.0)
MCV: 85 fL (ref 80.0–100.0)
Platelets: 262 10*3/uL (ref 150–400)
RBC: 3.27 MIL/uL — ABNORMAL LOW (ref 4.22–5.81)
RDW: 15 % (ref 11.5–15.5)
WBC: 13 10*3/uL — ABNORMAL HIGH (ref 4.0–10.5)
nRBC: 0 % (ref 0.0–0.2)

## 2022-05-02 LAB — GLUCOSE, CAPILLARY
Glucose-Capillary: 185 mg/dL — ABNORMAL HIGH (ref 70–99)
Glucose-Capillary: 179 mg/dL — ABNORMAL HIGH (ref 70–99)
Glucose-Capillary: 212 mg/dL — ABNORMAL HIGH (ref 70–99)
Glucose-Capillary: 228 mg/dL — ABNORMAL HIGH (ref 70–99)

## 2022-05-02 LAB — ECHOCARDIOGRAM COMPLETE
AR max vel: 1.76 cm2
AV Peak grad: 5.2 mmHg
Ao pk vel: 1.14 m/s
Area-P 1/2: 3.89 cm2
Calc EF: 36.3 %
Height: 69 in
MV M vel: 5.25 m/s
MV Peak grad: 110.3 mmHg
P 1/2 time: 637 msec
Radius: 0.5 cm
S' Lateral: 3.6 cm
Single Plane A2C EF: 42.2 %
Single Plane A4C EF: 34.9 %
Weight: 2430.35 oz

## 2022-05-02 LAB — BASIC METABOLIC PANEL
Anion gap: 12 (ref 5–15)
BUN: 58 mg/dL — ABNORMAL HIGH (ref 8–23)
CO2: 17 mmol/L — ABNORMAL LOW (ref 22–32)
Calcium: 8.5 mg/dL — ABNORMAL LOW (ref 8.9–10.3)
Chloride: 104 mmol/L (ref 98–111)
Creatinine, Ser: 2.24 mg/dL — ABNORMAL HIGH (ref 0.61–1.24)
GFR, Estimated: 28 mL/min — ABNORMAL LOW (ref >60)
Glucose, Bld: 199 mg/dL — ABNORMAL HIGH (ref 70–99)
Potassium: 4.9 mmol/L (ref 3.5–5.1)
Sodium: 133 mmol/L — ABNORMAL LOW (ref 135–145)

## 2022-05-02 LAB — LEGIONELLA PNEUMOPHILA SEROGP 1 UR AG: L. pneumophila Serogp 1 Ur Ag: NEGATIVE

## 2022-05-02 LAB — MAGNESIUM: Magnesium: 1.9 mg/dL (ref 1.7–2.4)

## 2022-05-02 LAB — TROPONIN I (HIGH SENSITIVITY): Troponin I (High Sensitivity): 10765 ng/L (ref <18)

## 2022-05-02 MED ORDER — TECHNETIUM TO 99M ALBUMIN AGGREGATED
4.0000 | Freq: Once | INTRAVENOUS | Status: AC | PRN
  Administered 2022-05-02: 4.26 via INTRAVENOUS

## 2022-05-02 MED ORDER — AZITHROMYCIN 500 MG PO TABS
500.0000 mg | ORAL_TABLET | Freq: Every day | ORAL | Status: DC
  Administered 2022-05-02: 500 mg via ORAL
  Filled 2022-05-02: qty 1

## 2022-05-02 MED ORDER — IPRATROPIUM-ALBUTEROL 0.5-2.5 (3) MG/3ML IN SOLN
3.0000 mL | Freq: Three times a day (TID) | RESPIRATORY_TRACT | Status: DC
  Administered 2022-05-02 – 2022-05-05 (×8): 3 mL via RESPIRATORY_TRACT
  Filled 2022-05-02 (×9): qty 3

## 2022-05-02 MED ORDER — HYDRALAZINE HCL 25 MG PO TABS
25.0000 mg | ORAL_TABLET | Freq: Three times a day (TID) | ORAL | Status: DC
  Administered 2022-05-02 – 2022-05-06 (×12): 25 mg via ORAL
  Filled 2022-05-02 (×12): qty 1

## 2022-05-02 NOTE — Progress Notes (Signed)
RN accompanied patient to nuclear medicine for scan.

## 2022-05-02 NOTE — Progress Notes (Signed)
ANTICOAGULATION CONSULT NOTE  Pharmacy Consult for heparin infusion Indication: ACS/STEMI  No Known Allergies  Patient Measurements: Height: '5\' 9"'$  (175.3 cm) Weight: 68.9 kg (151 lb 14.4 oz) IBW/kg (Calculated) : 70.7 Heparin Dosing Weight: 69.8 kg  Vital Signs: Temp: 98.1 F (36.7 C) (11/06 0200) Temp Source: Oral (11/06 0200) BP: 123/58 (11/06 0200) Pulse Rate: 66 (11/06 0200)  Labs: Recent Labs    04/29/2022 2326 05/01/22 0223 05/01/22 0649 05/01/22 1759 05/02/22 0228  HGB 11.1* 10.4*  --   --   --   HCT 34.7* 32.5*  --   --   --   PLT 263 285  --   --   --   HEPARINUNFRC  --   --  0.19* 0.66 0.64  CREATININE 2.20* 2.16*  --   --  2.24*  TROPONINIHS 9,511* 11,516* 10,343*  --   --      Estimated Creatinine Clearance: 22.6 mL/min (A) (by C-G formula based on SCr of 2.24 mg/dL (H)).   Medical History: Past Medical History:  Diagnosis Date   Anxiety    CAD (coronary artery disease)    Dementia ([place])    Diabetes mellitus without complication (HCC)    GERD (gastroesophageal reflux disease)    History of heart artery stent    HLD (hyperlipidemia)    HTN (hypertension)    Parkinson disease    Prostate cancer ([place])    over 5 years ago   Squamous cell carcinoma of skin 06/08/2021   L vertex, EDC    Assessment: Pt is a 88 yo male presenting to ED w/ respiratory distress found with elevated troponin I and diagnoses w/ nSTEMI.  11/5  0649  HL=0.19 Subtherapeutic  inc drip 900 to 1100 u/hr 11/5 1759   HL=0.66 Therapeutic x1; 1100 un/hr 11/6 0228 HL 64, therapeutic x 2  Goal of Therapy:  Heparin level 0.3-0.7 units/ml Monitor platelets by anticoagulation protocol: Yes   Plan:  Continue heparin infusion at 1100 units/hr Recheck HL daily w/ AM labs while therapeutic  CBC daily while on heparin gtt  Renda Rolls, PharmD, [facility] 05/02/2022 3:09 AM

## 2022-05-02 NOTE — Progress Notes (Signed)
PHARMACIST - PHYSICIAN COMMUNICATION  CONCERNING: Antibiotic IV to Oral Route Change Policy  RECOMMENDATION: This patient is receiving azithromycin by the intravenous route.  Based on criteria approved by the Pharmacy and Therapeutics Committee, the antibiotic(s) is/are being converted to the equivalent oral dose form(s).   DESCRIPTION: These criteria include: Patient being treated for a respiratory tract infection, urinary tract infection, cellulitis or clostridium difficile associated diarrhea if on metronidazole The patient is not neutropenic and does not exhibit a GI malabsorption state The patient is eating (either orally or via tube) and/or has been taking other orally administered medications for a least 24 hours The patient is improving clinically and has a Tmax < 100.5  If you have questions about this conversion, please contact the [facility]  05/02/22

## 2022-05-02 NOTE — Hospital Course (Signed)
Dennis Serrano is a 88 y.o. male with medical history significant for CAD, Alzheimer's disease, tension, hyperlipidemia, Parkinson's, persistent A-fib, type 2 diabetes, COPD, who presented to the ED around 11 pm on 05/07/2022 for evaluation of worsening shortness of breath.    Patient was admitted for acute respiratory failure with hypoxia due to multifocal pneumonia.  He was started on IV antibiotics for working diagnosis of pneumonia, and IV heparin for possible ACS, with hs-troponin elevated (peaked above 11k) in the absence of chest pain.  Cardiology consulted.   11/5: on 12 L/min HFNC oxygen, otherwise stable, no CP  11/6: O2 needs improving, weaned 6 >> 4 L/min HFNC O2 this morning.  Delirium noted overnight.  Echo report still pending.

## 2022-05-02 NOTE — Progress Notes (Addendum)
05/01/22 1900 patient pulling off all leads and pulling at IV unsure why he's here but knows the month and year and self. Mitts placed for safety. 2200 patient remains restless trying to get out bed   05/02/22 0100 Prn pain medication given one mitt removed to help patient relax 0300 Hep level therapeutic 0500 tried to remove other mitt patient pulling at Ivs, foley and took off tele leads placed both mitts back on patient

## 2022-05-02 NOTE — Progress Notes (Addendum)
Progress Note   Patient: Dennis Serrano GXQ:[id number] DOB: [birth year] DOA: 05/22/2022     1 DOS: the patient was seen and examined on 05/02/2022   Brief hospital course: Alando Colleran is a 88 y.o. male with medical history significant for CAD, Alzheimer's disease, tension, hyperlipidemia, Parkinson's, persistent A-fib, type 2 diabetes, COPD, who presented to the ED around 11 pm on 05/16/2022 for evaluation of worsening shortness of breath.    Patient was admitted for acute respiratory failure with hypoxia due to multifocal pneumonia.  He was started on IV antibiotics for working diagnosis of pneumonia, and IV heparin for possible ACS, with hs-troponin elevated (peaked above 11k) in the absence of chest pain.  Cardiology consulted.   11/5: on 12 L/min HFNC oxygen, otherwise stable, no CP  11/6: O2 needs improving, weaned 6 >> 4 L/min HFNC O2 this morning.  Delirium noted overnight.  Echo report still pending.    Assessment and Plan: * Acute hypoxemic respiratory failure (HCC) Due to multifocal pneumonia.  Initially required BiPAP for respiratory support, transitioned to high-flow [place] oxygen.  Respiratory viral panel positive for Rhinovirus.  PE remains in differential, covered with anticoagulation due to NSTEMI. - Wean oxygen as tolerated, keep sat > 88% -- Will get NM lung perfusion scan to rule out PE -- Treat PNA as outlined  CAP (community acquired pneumonia) Ceftriaxone and azithromycin - continue Respiratory viral panel +Rhinovirus Supportive care with antitussives PRN, pulmonary hygiene, supplement O2 SLP consult for swallow evaluation  Severe sepsis ([place]) POA with tachypnea, hypoxia, tachycardia, and evidence of endorgan damage with lactic acidosis and AKI.  Sepsis protocol initiated. - Antibiotics per CAP protocol - Follow-up urine and blood cultures  Delirium due to multiple etiologies, acute, hyperactive Overnight 11/5-6: pt restless, attempting to get up out of bed, pulling  at IV's.  Family report underlying dementia.  Suspect delirium in setting of infection and hypoxia, in addition to ICU setting.  --Delirium precautions --Maintain wake/sleep cycle --PT/OT  Goals of care, counseling/discussion Discussed code status with patient's family / surrogate decision makers.  They request DNR and confirm no CPR, no defibrillation, no ACLS meds, no intubation in the event of cardiac and or respiratory arrest.   --DNR order placed.  Known medical problems Hypertension-continue amlodipine, diltiazem, hydralazine.  Hold lisinopril and spironolactone in setting of AKI. CAD-continue aspirin 81 mg Parkinson's-continue Sinemet DM2-sliding scale insulin correction factor BPH-continue Flomax  AKI (acute kidney injury) ([place]) Cr on admission 2.20, up from 1.54 last year, 1.15 two years ago.  Likely prerenal in setting of sepsis and NSTEMI --Treated with fluid resuscitation which has been stopped for concern of volume overload -- Hold lisinopril and spironolactone -- Avoid nephrotoxins -- Daily BMP  NSTEMI (non-ST elevated myocardial infarction) (HCC) No chest pain and no significant EKG changes.  Last echocardiogram done in April 2018, mild LVH was only abnormality seen.  This seems like severe demand ischemia, no chest pain complaints.   - Troponin peaked above 11k -- Continue heparin drip -- [facility] following, Dr. Clayborn Bigness - Echocardiogram still pending to assess for [facility] --Further eval per cardiology  COPD with acute exacerbation [facility]) Prior chest CTs that have shown severe emphysema.  ON admission, had diffuse wheezing with moderately poor air movement appreciated on exam.  Started on IV steroids.  Initially was on BiPAP. - Antibiotics as above -- Continue prednisone --Bronchodilators  CAD (coronary artery disease) No active chest pain.  Does have NSTEMI felt due to demand ischemia.  Management per cardiology.  --  On heparin drip --Continue ASA         Subjective: Pt seen in stepdown with granddaughter at bedside.  He has a productive sounding cough.  He denies chest pain or feeling short of breath.  Overnight, noted to have delirium and was restless, attempting to get out of bed, pulling IV's.  More calm this AM and appears very drowsy from being up overnight.  No acute events reported.   Physical Exam: Vitals:   05/02/22 0802 05/02/22 0900 05/02/22 1103 05/02/22 1109  BP: (!) 120/59  127/60 127/60  Pulse: 66 64    Resp: 19 16    Temp:      TempSrc:      SpO2: 95% 92%    Weight:      Height:       General exam: awake, alert, no acute distress, chronically ill-appearing HEENT: atraumatic, clear conjunctiva, anicteric sclera, moist mucus membranes, hearing grossly normal  Respiratory system: CTAB, productive sounding cough, no wheezes, normal respiratory effort, on 4 L/min HFNC O2. Cardiovascular system: normal S1/S2, RRR, no pedal edema.   Gastrointestinal system: soft, NT, ND, no HSM felt, +bowel sounds. Central nervous system: 3no gross focal neurologic deficits, normal speech Extremities: moves all, no edema, normal tone Skin: dry, intact, normal temperature Psychiatry: normal mood, congruent affect, judgement and insight appear normal   Data Reviewed:  Notable labs: Glucose 199, sodium 133, bicarb 17, BUN 58, calcium 8 point troponin downtrended 10,000 765, white count 13.0, hemoglobin 9.2.  Respiratory viral panel positive for rhinovirus.  Echocardiogram is still pending  Family Communication: granddaughter at bedside on rounds today  Disposition: Status is: Inpatient Remains inpatient appropriate because: Severity of illness remaining on IV therapies and ongoing evaluation as outlined above    Planned Discharge Destination:  Home with home health versus SNF pending PT OT evaluations when patient is able to participate    Time spent: 45 minutes  Author: Ezekiel Slocumb, DO 05/02/2022 2:32 PM  For on call  review [email or web address].

## 2022-05-02 NOTE — Assessment & Plan Note (Signed)
No active chest pain.  Does have NSTEMI felt due to demand ischemia.  Management per cardiology.  --On heparin drip --Continue ASA

## 2022-05-02 NOTE — Evaluation (Addendum)
Clinical/Bedside Swallow Evaluation Patient Details  Name: Dennis Serrano MRN: [id number] Date of Birth: 1932/11/06  Today's Date: 05/02/2022 Time: SLP Start Time (ACUTE ONLY): 63 SLP Stop Time (ACUTE ONLY): 1330 SLP Time Calculation (min) (ACUTE ONLY): 60 min  Past Medical History:  Past Medical History:  Diagnosis Date   Anxiety    CAD (coronary artery disease)    Dementia ([place])    Diabetes mellitus without complication (HCC)    GERD (gastroesophageal reflux disease)    History of heart artery stent    HLD (hyperlipidemia)    HTN (hypertension)    Parkinson disease    Prostate cancer ([place])    over 5 years ago   Squamous cell carcinoma of skin 06/08/2021   L vertex, EDC   Past Surgical History:  Past Surgical History:  Procedure Laterality Date   APPENDECTOMY     CORONARY STENT PLACEMENT     EYE SURGERY     TRANSURETHRAL RESECTION OF PROSTATE     HPI:  Pt is a 88 y.o. male with medical history significant for CAD, NSTEMI, AKI, Alzheimer's disease, tension, hyperlipidemia, Parkinson's, persistent A-fib, type 2 diabetes, COPD, who presents with shortness of breath.  On interview in the ED, patient reports that he is feeling short of breath and has had a cough (unable to give a timeframe) but denies any other symptoms.  BIPAP had been placed by EMS.  Currently on [place] O2 support.   CXR on 05/02/22 revealed: "There is interval worsening of pulmonary vascular congestion  suggesting worsening CHF. There is interval increase in  opacification of both parahilar regions and lower lung fields  suggesting increase in bilateral pleural effusions and worsening  pulmonary edema.".    Assessment / Plan / Recommendation  Clinical Impression   Pt seen today for BSE; Daughter present in room. Pt awake/alert to self. He often asked repetitive questions re: "this place", the people here, his family members especially where the deceased family members were buried. Dtr answered all of his questions.   Pt on [place] O2 support. Afebrile. He was able to feed himself w/ setup support. He appeared to benefit from reducing distractions during the session.  OF NOTE: Pt exhibited a Congested cough PRIOR to any po's given w/ moving about in bed.   Pt appears to present w/ oral phase dysphagia in setting of declined Cognitive status; Baseline advanced Dementia. Family reported that pt's Dementia "worsened" in a previous chart note/admit. Cognitive decline can impact overall awareness/timing of swallow and safety during po tasks which increases risk for aspiration, choking. Pt's risk for aspiration can be reduced when following general aspiration precautions and using a modified diet consistency of blended foods d/t Edentulous status and ease of oral phase/clearing.  OF NOTE: pt also has declined Respiratory status -- see most recent CXR indicating "interval worsening of pulmonary vascular congestion  suggesting worsening CHF. There is interval increase in  opacification of both parahilar regions and lower lung fields  suggesting increase in bilateral pleural effusions and worsening  pulmonary edema.". Any Pulmonary decline can impact timing of swallowing thus increase risk for aspiration to occur.   Pt consumed several trials of ice chips, purees, and thin liquids via straw w/ No immediate, overt clinical s/s of aspiration noted: no decline in vocal quality; no immediate cough, and no decline in respiratory status during/post trials -- O2 sats remained 91-94%. Oral phase was grossly adequate for bolus management and oral clearing of the boluses given -- min increased oral  phase Time in A-P transfer of both bolus consistencies noted; moreso puree foods. No solids were attempted d/t this presentation and recent oral phase deficits per NSG/Dtr w/ increased textured foods at previous meals.  Pt attempted self-feeding but required min-mod support and guidance. He was able to feed self somewhat which improves safety of  swallowing.  OM Exam appeared [facility] w/ No unilateral weakness noted. Some confusion of OM tasks noted. Hand over hand guidance and visual cue were helpful to initiate tasks.         In setting of baseline Dementia and Cognitive decline, Edentulous status, and declined Pulmonary status all which increase risk for aspiration, recommend the dysphagia level 1(PUREED foods moistened for ease of oral phase) w/ thin liquids -- monitor straw use and use CUP as indicated for controlled sips; aspiration and REFLUX precautions; reduce Distractions during meals and engage pt during meals for self-feeding. Pills Crushed in Puree for safer swallowing. Support w/ feeding at meals and give Rest Breaks to lessen any WOB w/ po tasks. ST services will f/u w/ toleration of diet next 1-2 days. MD/NSG updated.  ST services recommends follow w/ Palliative Care for [place] and education re: impact of Cognitive decline/Dementia and Pulmonary decline on swallowing. Suspect pt is close to/at his baseline. Precautions posted in room. SLP Visit Diagnosis: Dysphagia, oral phase (R13.11) (baseline Dementia)    Aspiration Risk  Mild aspiration risk;Risk for inadequate nutrition/hydration    Diet Recommendation   dysphagia level 1(PUREED foods moistened for ease of oral phase) w/ thin liquids -- monitor straw use and use CUP as indicated for controlled sips; aspiration and REFLUX precautions; reduce Distractions during meals and engage pt during meals for self-feeding. Support w/ feeding at meals and give Rest Breaks to lessen any WOB w/ po tasks.  Medication Administration: Crushed with puree    Other  Recommendations Recommended Consults:  (Dietician f/u; Palliative Care consult for [place]) Oral Care Recommendations: Oral care BID;Oral care before and after PO;Staff/trained caregiver to provide oral care Other Recommendations:  (n/a)    Recommendations for follow up therapy are one component of a multi-disciplinary discharge planning  process, led by the attending physician.  Recommendations may be updated based on patient status, additional functional criteria and insurance authorization.  Follow up Recommendations Follow physician's recommendations for discharge plan and follow up therapies      Assistance Recommended at Discharge Frequent or constant Supervision/Assistance  Functional Status Assessment Patient has had a recent decline in their functional status and/or demonstrates limited ability to make significant improvements in function in a reasonable and predictable amount of time  Frequency and Duration min 2x/week  1 week       Prognosis Prognosis for Safe Diet Advancement: Guarded (-Fair) Barriers to Reach Goals: Cognitive deficits;Time post onset;Severity of deficits;Behavior Barriers/Prognosis Comment: baseline Dementia; declined pulmonary status      Swallow Study   General Date of Onset: 05/07/2022 HPI: Pt is a 88 y.o. male with medical history significant for CAD, NSTEMI, AKI, Alzheimer's disease, tension, hyperlipidemia, Parkinson's, persistent A-fib, type 2 diabetes, COPD, who presents with shortness of breath.  On interview in the ED, patient reports that he is feeling short of breath and has had a cough (unable to give a timeframe) but denies any other symptoms.  BIPAP had been placed by EMS.  Currently on [place] O2 support.  CXR on 05/02/22 revealed: "There is interval worsening of pulmonary vascular congestion  suggesting worsening CHF. There is interval increase in  opacification  of both parahilar regions and lower lung fields  suggesting increase in bilateral pleural effusions and worsening  pulmonary edema.". Type of Study: Bedside Swallow Evaluation Previous Swallow Assessment: none Diet Prior to this Study: Dysphagia 2 (chopped);Thin liquids (from regular at admit) Temperature Spikes Noted: No (wbc 13.0) Respiratory Status: Nasal cannula (4L) History of Recent Intubation: No Behavior/Cognition:  Alert;Cooperative;Pleasant mood;Confused;Distractible;Requires cueing (baseline Dementia) Oral Cavity Assessment: Within Functional Limits Oral Care Completed by SLP: Recent completion by staff Oral Cavity - Dentition: Missing dentition (not wearing his Dentures) Vision: Functional for self-feeding Self-Feeding Abilities: Able to feed self;Needs assist;Needs set up Patient Positioning: Upright in bed (needed support in sitting upright) Baseline Vocal Quality: Normal Volitional Cough: Strong;Congested Volitional Swallow: Able to elicit    Oral/Motor/Sensory Function Overall Oral Motor/Sensory Function: Within functional limits   Ice Chips Ice chips: Within functional limits Presentation: Spoon (fed; 3 trials)   Thin Liquid Thin Liquid:  (no immediate overt s/s of aspiration noted; mild delay in A-P transfer during the oral phase) Presentation: Self Fed;Straw (10 trials) Other Comments: mild congested cough occurred infrequently b/t trials -- no immediately related to the trials.    Nectar Thick Nectar Thick Liquid: Not tested   Honey Thick Honey Thick Liquid: Not tested   Puree Puree: Impaired (min) Presentation: Self Fed;Spoon (supported; 8-9 trials) Oral Phase Functional Implications: Prolonged oral transit (cleared appropriately) Pharyngeal Phase Impairments:  (none) Other Comments: 1 mild, congested cough b/t trials -- did not appear directly related to the trials   Solid     Solid: Not tested        Orinda Kenner, MS, CCC-SLP Speech Language Pathologist Rehab Services; [place] [phone] (ascom) Takhia Spoon 05/02/2022,4:58 PM

## 2022-05-02 NOTE — Assessment & Plan Note (Signed)
Overnight 11/5-6: pt restless, attempting to get up out of bed, pulling at IV's.  Family report underlying dementia.  Suspect delirium in setting of infection and hypoxia, in addition to ICU setting.  --Delirium precautions --Maintain wake/sleep cycle --PT/OT

## 2022-05-02 NOTE — Progress Notes (Signed)
[facility] Cardiology    SUBJECTIVE: Tired fatigue is slightly lethargic denies any pain no apparent respiratory distress denies any chest pain no worsening shortness of breath no fever chills or sweats   Vitals:   05/02/22 1600 05/02/22 1630 05/02/22 1700 05/02/22 1800  BP: (!) 135/55 (!) 135/55  (!) 131/114  Pulse: 74 67 67 69  Resp: (!) '21 19 19 18  '$ Temp: 98.4 F (36.9 C)     TempSrc: Oral  Oral   SpO2: 92% 93% (!) 89% 91%  Weight:      Height:         Intake/Output Summary (Last 24 hours) at 05/02/2022 2047 Last data filed at 05/02/2022 1800 Gross per 24 hour  Intake 519.88 ml  Output 645 ml  Net -125.12 ml      PHYSICAL EXAM  General: Well developed, well nourished, in no acute distress HEENT:  Normocephalic and atramatic Neck:  No JVD.  Lungs: Clear bilaterally to auscultation and percussion. Heart: HRRR . Normal S1 and S2 without gallops or murmurs.  Abdomen: Bowel sounds are positive, abdomen soft and non-tender  Msk:  Back normal, normal gait. Normal strength and tone for age. Extremities: No clubbing, cyanosis or edema.   Neuro: Alert and oriented X 3. Psych:  Good affect, responds appropriately   LABS: Basic Metabolic Panel: Recent Labs    05/04/2022 2326 05/01/22 0223 05/02/22 0228  NA 134* 135 133*  K 5.3* 4.8 4.9  CL 101 103 104  CO2 20* 19* 17*  GLUCOSE 223* 218* 199*  BUN 42* 45* 58*  CREATININE 2.20* 2.16* 2.24*  CALCIUM 9.0 8.7* 8.5*  MG 1.7  --  1.9   Liver Function Tests: Recent Labs    05/09/2022 2326 05/01/22 0223  AST 91* 82*  ALT <5 8  ALKPHOS 117 106  BILITOT 0.4 0.4  PROT 7.5 7.0  ALBUMIN 3.8 3.3*   No results for input(s): "LIPASE", "AMYLASE" in the last 72 hours. CBC: Recent Labs    04/29/2022 2326 05/01/22 0223 05/02/22 0628  WBC 13.6* 11.3* 13.0*  NEUTROABS 12.1*  --   --   HGB 11.1* 10.4* 9.2*  HCT 34.7* 32.5* 27.8*  MCV 87.6 87.4 85.0  PLT 263 285 262   Cardiac Enzymes: No results for input(s): "CKTOTAL", "CKMB",  "CKMBINDEX", "TROPONINI" in the last 72 hours. BNP: Invalid input(s): "POCBNP" D-Dimer: No results for input(s): "DDIMER" in the last 72 hours. Hemoglobin A1C: No results for input(s): "HGBA1C" in the last 72 hours. Fasting Lipid Panel: No results for input(s): "CHOL", "HDL", "LDLCALC", "TRIG", "CHOLHDL", "LDLDIRECT" in the last 72 hours. Thyroid Function Tests: No results for input(s): "TSH", "T4TOTAL", "T3FREE", "THYROIDAB" in the last 72 hours.  Invalid input(s): "FREET3" Anemia Panel: No results for input(s): "VITAMINB12", "FOLATE", "FERRITIN", "TIBC", "IRON", "RETICCTPCT" in the last 72 hours.  NM Pulmonary Perfusion  Result Date: 05/02/2022 CLINICAL DATA:  Short of breath. EXAM: NUCLEAR MEDICINE PERFUSION LUNG SCAN TECHNIQUE: Perfusion images were obtained in multiple projections after intravenous injection of radiopharmaceutical. RADIOPHARMACEUTICALS:  4.26 mCi Tc-73mMAA COMPARISON:  Chest radiograph 05/08/2022 FINDINGS: Exam is limited by patient's arms at side. Patient was unable to elevate arms for exam. Decreased region perfusion to the RIGHT lung apex on multiple projections. Perfusion defect seen on the RPO projection is favored related to arm attenuation. No wedge-shaped peripheral perfusion defect.s Chest radiograph demonstrates bibasilar airspace opacity and RIGHT effusion. RIGHT upper lobe relatively clear. IMPRESSION: Concern for RIGHT upper lobe pulmonary embolism. Intermediate probability. Limited exam  due to arms at side. Electronically Signed   By: Suzy Bouchard M.D.   On: 05/02/2022 16:24   DG Chest Port 1 View  Result Date: 05/02/2022 CLINICAL DATA:  Acute respiratory failure, hypoxia EXAM: PORTABLE CHEST 1 VIEW COMPARISON:  Previous studies including the examination done on 05/23/2022 FINDINGS: Transverse diameter of heart is increased. There is interval worsening of pulmonary vascular congestion. Increased interstitial and alveolar markings are seen in parahilar  regions and lower lung fields. Small to moderate bilateral pleural effusions are seen with interval increase. Evaluation of lower lung fields for focal infiltrates is limited by the bilateral effusions. There is no pneumothorax. IMPRESSION: There is interval worsening of pulmonary vascular congestion suggesting worsening CHF. There is interval increase in opacification of both parahilar regions and lower lung fields suggesting increase in bilateral pleural effusions and worsening pulmonary edema. Electronically Signed   By: Elmer Picker M.D.   On: 05/02/2022 15:35   ECHOCARDIOGRAM COMPLETE  Result Date: 05/02/2022    ECHOCARDIOGRAM REPORT   Patient Name:   Dennis Serrano Date of Exam: 05/01/2022 Medical Rec #:  [id number]     Height:       69.0 in Accession #:    [id number]    Weight:       151.9 lb Date of Birth:  [birth year]     BSA:          1.838 m Patient Age:    88 years      BP:           136/72 mmHg Patient Gender: M             HR:           75 bpm. Exam Location:  [facility] Procedure: 2D Echo Indications:     Elevated Troponin  History:         Patient has prior history of Echocardiogram examinations, most                  recent 10/04/2016.  Sonographer:     Kathlen Brunswick RDCS Referring Phys:  [id number] Rison M ECKSTAT Diagnosing Phys: Yolonda Kida MD  Sonographer Comments: Suboptimal parasternal window. Image acquisition challenging due to patient body habitus. IMPRESSIONS  1. Left ventricular ejection fraction, by estimation, is 50 to 55%. The left ventricle has low normal function. The left ventricle has no regional wall motion abnormalities. There is mild concentric left ventricular hypertrophy. Left ventricular diastolic parameters are consistent with Grade I diastolic dysfunction (impaired relaxation).  2. Right ventricular systolic function is normal. The right ventricular size is normal.  3. Left atrial size was mildly dilated.  4. Right atrial size was mildly dilated.  5. The mitral  valve is normal in structure. Moderate mitral valve regurgitation.  6. Tricuspid valve regurgitation is mild to moderate.  7. The aortic valve is normal in structure. Aortic valve regurgitation is trivial. FINDINGS  Left Ventricle: Left ventricular ejection fraction, by estimation, is 50 to 55%. The left ventricle has low normal function. The left ventricle has no regional wall motion abnormalities. The left ventricular internal cavity size was normal in size. There is mild concentric left ventricular hypertrophy. Left ventricular diastolic parameters are consistent with Grade I diastolic dysfunction (impaired relaxation). Right Ventricle: The right ventricular size is normal. No increase in right ventricular wall thickness. Right ventricular systolic function is normal. Left Atrium: Left atrial size was mildly dilated. Right Atrium: Right atrial size was mildly dilated. Pericardium: There is no  evidence of pericardial effusion. Mitral Valve: The mitral valve is normal in structure. Moderate mitral valve regurgitation. Tricuspid Valve: The tricuspid valve is normal in structure. Tricuspid valve regurgitation is mild to moderate. Aortic Valve: The aortic valve is normal in structure. Aortic valve regurgitation is trivial. Aortic regurgitation PHT measures 637 msec. Aortic valve peak gradient measures 5.2 mmHg. Pulmonic Valve: The pulmonic valve was normal in structure. Pulmonic valve regurgitation is not visualized. Aorta: The ascending aorta was not well visualized. IAS/Shunts: No atrial level shunt detected by color flow Doppler.  LEFT VENTRICLE PLAX 2D LVIDd:         4.90 cm     Diastology LVIDs:         3.60 cm     LV e' medial:    4.19 cm/s LV PW:         1.40 cm     LV E/e' medial:  19.4 LV IVS:        1.20 cm     LV e' lateral:   7.56 cm/s LVOT diam:     2.00 cm     LV E/e' lateral: 10.7 LV SV:         38 LV SV Index:   21 LVOT Area:     3.14 cm  LV Volumes (MOD) LV vol d, MOD A2C: 90.0 ml LV vol d, MOD A4C:  53.0 ml LV vol s, MOD A2C: 52.0 ml LV vol s, MOD A4C: 34.5 ml LV SV MOD A2C:     38.0 ml LV SV MOD A4C:     53.0 ml LV SV MOD BP:      27.1 ml RIGHT VENTRICLE RV Basal diam:  3.70 cm RV S prime:     7.56 cm/s TAPSE (M-mode): 1.4 cm LEFT ATRIUM             Index        RIGHT ATRIUM           Index LA diam:        4.40 cm 2.39 cm/m   RA Area:     14.20 cm LA Vol (A2C):   51.7 ml 28.13 ml/m  RA Volume:   35.60 ml  19.37 ml/m LA Vol (A4C):   40.0 ml 21.76 ml/m LA Biplane Vol: 47.7 ml 25.95 ml/m  AORTIC VALVE                 PULMONIC VALVE AV Area (Vmax): 1.76 cm     PV Vmax:       0.97 m/s AV Vmax:        114.00 cm/s  PV Peak grad:  3.8 mmHg AV Peak Grad:   5.2 mmHg LVOT Vmax:      63.95 cm/s LVOT Vmean:     40.350 cm/s LVOT VTI:       0.120 m AI PHT:         637 msec  AORTA Ao Root diam: 2.60 cm MITRAL VALVE                  TRICUSPID VALVE MV Area (PHT): 3.89 cm       TV Peak grad:   36.8 mmHg MV Decel Time: 195 msec       TV Vmax:        3.04 m/s MR Peak grad:    110.2 mmHg   TR Peak grad:   41.7 mmHg MR Mean grad:    66.0 mmHg    TR  Vmax:        323.00 cm/s MR Vmax:         525.00 cm/s MR Vmean:        373.0 cm/s   SHUNTS MR PISA:         1.57 cm     Systemic VTI:  0.12 m MR PISA Eff ROA: 9 mm        Systemic Diam: 2.00 cm MR PISA Radius:  0.50 cm MV E velocity: 81.20 cm/s MV A velocity: 97.50 cm/s MV E/A ratio:  0.83 Jolaine Fryberger D Basim Bartnik MD Electronically signed by Yolonda Kida MD Signature Date/Time: 05/02/2022/3:02:48 PM    Final    DG Chest Port 1 View  Result Date: 05/19/2022 CLINICAL DATA:  Respiratory distress EXAM: PORTABLE CHEST 1 VIEW COMPARISON:  Radiograph 08/24/2017 FINDINGS: Hazy airspace and interstitial opacities greatest in the right mid and lower lung. Possible small right pleural effusion. No pneumothorax. Stable cardiomediastinal silhouette. Aortic atherosclerotic calcification. No acute osseous abnormality. IMPRESSION: Nonspecific hazy opacities greatest in the right mid and lower  lung can be seen with infection or edema. Electronically Signed   By: Placido Sou M.D.   On: 05/16/2022 23:38     Echo preserved overall left ventricular function EF of at least 50%  TELEMETRY: Normal sinus rhythmST-T changes:  ASSESSMENT AND PLAN:  Principal Problem:   Acute hypoxemic respiratory failure (HCC) Active Problems:   Severe sepsis (HCC)   CAP (community acquired pneumonia)   CAD (coronary artery disease)   COPD with acute exacerbation (HCC)   NSTEMI (non-ST elevated myocardial infarction) ([place])   AKI (acute kidney injury) ([place])   Known medical problems   Goals of care, counseling/discussion   Delirium due to multiple etiologies, acute, hyperactive    Plan Hypoxic respiratory failure continue supplemental oxygen with inhalers consider pulmonary input Congestive heart failure by chest x-ray with limited diuresis because of evidence of probable dehydration and pneumonia continue conservative therapy Elevated troponin possible non-STEMI CT troponins up to 11,000 no chest pain short term anticoagulation 48 to 72 hours Acute renal insufficiency consider gentle hydration avoid nephrotoxic drugs Agree with broad-spectrum antibiotic therapy for possible pneumonia bronchitis No invasive cardiac procedures planned    Yolonda Kida, MD, 05/02/2022 8:47 PM

## 2022-05-02 NOTE — Plan of Care (Signed)
  Problem: Education: Goal: Knowledge of disease or condition will improve Outcome: Not Progressing Goal: Knowledge of the prescribed therapeutic regimen will improve Outcome: Not Progressing Goal: Individualized Educational Video(s) Outcome: Not Progressing   Problem: Activity: Goal: Ability to tolerate increased activity will improve Outcome: Not Progressing Goal: Will verbalize the importance of balancing activity with adequate rest periods Outcome: Not Progressing   Problem: Respiratory: Goal: Ability to maintain a clear airway will improve Outcome: Not Progressing Goal: Levels of oxygenation will improve Outcome: Not Progressing Goal: Ability to maintain adequate ventilation will improve Outcome: Not Progressing   Problem: Activity: Goal: Ability to tolerate increased activity will improve Outcome: Not Progressing   Problem: Clinical Measurements: Goal: Ability to maintain a body temperature in the normal range will improve Outcome: Not Progressing   Problem: Respiratory: Goal: Ability to maintain adequate ventilation will improve Outcome: Not Progressing Goal: Ability to maintain a clear airway will improve Outcome: Not Progressing   Problem: Education: Goal: Knowledge of General Education information will improve Description: Including pain rating scale, medication(s)/side effects and non-pharmacologic comfort measures Outcome: Not Progressing   Problem: Health Behavior/Discharge Planning: Goal: Ability to manage health-related needs will improve Outcome: Not Progressing   Problem: Clinical Measurements: Goal: Ability to maintain clinical measurements within normal limits will improve Outcome: Not Progressing Goal: Will remain free from infection Outcome: Not Progressing Goal: Diagnostic test results will improve Outcome: Not Progressing Goal: Respiratory complications will improve Outcome: Not Progressing Goal: Cardiovascular complication will be  avoided Outcome: Not Progressing   Problem: Activity: Goal: Risk for activity intolerance will decrease Outcome: Not Progressing   Problem: Nutrition: Goal: Adequate nutrition will be maintained Outcome: Not Progressing   Problem: Coping: Goal: Level of anxiety will decrease Outcome: Not Progressing   Problem: Elimination: Goal: Will not experience complications related to bowel motility Outcome: Not Progressing Goal: Will not experience complications related to urinary retention Outcome: Not Progressing   Problem: Pain Managment: Goal: General experience of comfort will improve Outcome: Not Progressing   Problem: Safety: Goal: Ability to remain free from injury will improve Outcome: Not Progressing   Problem: Skin Integrity: Goal: Risk for impaired skin integrity will decrease Outcome: Not Progressing  Patient unable to move self one-to-one feeder on oxygen all declines from what patient is able to do at home

## 2022-05-02 NOTE — Progress Notes (Signed)
PT Cancellation Note  Patient Details Name: Dennis Serrano MRN: [id number] DOB: 08/19/1932   Cancelled Treatment:    Reason Eval/Treat Not Completed: Patient at procedure or test/unavailable, will attempt to see pt at a future date/time as medically appropriate.    Linus Salmons PT, DPT 05/02/22, 3:29 PM

## 2022-05-03 ENCOUNTER — Inpatient Hospital Stay: Payer: Medicare HMO

## 2022-05-03 DIAGNOSIS — I2699 Other pulmonary embolism without acute cor pulmonale: Secondary | ICD-10-CM

## 2022-05-03 DIAGNOSIS — F05 Delirium due to known physiological condition: Secondary | ICD-10-CM | POA: Diagnosis not present

## 2022-05-03 DIAGNOSIS — E871 Hypo-osmolality and hyponatremia: Secondary | ICD-10-CM | POA: Diagnosis not present

## 2022-05-03 DIAGNOSIS — J9601 Acute respiratory failure with hypoxia: Secondary | ICD-10-CM | POA: Diagnosis not present

## 2022-05-03 DIAGNOSIS — I5033 Acute on chronic diastolic (congestive) heart failure: Secondary | ICD-10-CM | POA: Diagnosis present

## 2022-05-03 DIAGNOSIS — I214 Non-ST elevation (NSTEMI) myocardial infarction: Secondary | ICD-10-CM | POA: Diagnosis not present

## 2022-05-03 LAB — BASIC METABOLIC PANEL
Anion gap: 12 (ref 5–15)
Anion gap: 11 (ref 5–15)
BUN: 80 mg/dL — ABNORMAL HIGH (ref 8–23)
BUN: 89 mg/dL — ABNORMAL HIGH (ref 8–23)
CO2: 17 mmol/L — ABNORMAL LOW (ref 22–32)
CO2: 17 mmol/L — ABNORMAL LOW (ref 22–32)
Calcium: 8.4 mg/dL — ABNORMAL LOW (ref 8.9–10.3)
Calcium: 8.7 mg/dL — ABNORMAL LOW (ref 8.9–10.3)
Chloride: 103 mmol/L (ref 98–111)
Chloride: 103 mmol/L (ref 98–111)
Creatinine, Ser: 2.59 mg/dL — ABNORMAL HIGH (ref 0.61–1.24)
Creatinine, Ser: 2.88 mg/dL — ABNORMAL HIGH (ref 0.61–1.24)
GFR, Estimated: 23 mL/min — ABNORMAL LOW (ref >60)
GFR, Estimated: 20 mL/min — ABNORMAL LOW (ref >60)
Glucose, Bld: 206 mg/dL — ABNORMAL HIGH (ref 70–99)
Glucose, Bld: 253 mg/dL — ABNORMAL HIGH (ref 70–99)
Potassium: 4.5 mmol/L (ref 3.5–5.1)
Potassium: 5 mmol/L (ref 3.5–5.1)
Sodium: 132 mmol/L — ABNORMAL LOW (ref 135–145)
Sodium: 131 mmol/L — ABNORMAL LOW (ref 135–145)

## 2022-05-03 LAB — MAGNESIUM: Magnesium: 2.1 mg/dL (ref 1.7–2.4)

## 2022-05-03 LAB — CBC
HCT: 25.9 % — ABNORMAL LOW (ref 39.0–52.0)
Hemoglobin: 8.7 g/dL — ABNORMAL LOW (ref 13.0–17.0)
MCH: 28.1 pg (ref 26.0–34.0)
MCHC: 33.6 g/dL (ref 30.0–36.0)
MCV: 83.5 fL (ref 80.0–100.0)
Platelets: 268 10*3/uL (ref 150–400)
RBC: 3.1 MIL/uL — ABNORMAL LOW (ref 4.22–5.81)
RDW: 15.1 % (ref 11.5–15.5)
WBC: 10.1 10*3/uL (ref 4.0–10.5)
nRBC: 0 % (ref 0.0–0.2)

## 2022-05-03 LAB — GLUCOSE, CAPILLARY
Glucose-Capillary: 198 mg/dL — ABNORMAL HIGH (ref 70–99)
Glucose-Capillary: 256 mg/dL — ABNORMAL HIGH (ref 70–99)
Glucose-Capillary: 258 mg/dL — ABNORMAL HIGH (ref 70–99)
Glucose-Capillary: 174 mg/dL — ABNORMAL HIGH (ref 70–99)

## 2022-05-03 LAB — BRAIN NATRIURETIC PEPTIDE: B Natriuretic Peptide: 1787.2 pg/mL — ABNORMAL HIGH (ref 0.0–100.0)

## 2022-05-03 LAB — PROCALCITONIN: Procalcitonin: 0.16 ng/mL

## 2022-05-03 LAB — HEPARIN LEVEL (UNFRACTIONATED): Heparin Unfractionated: 0.32 IU/mL (ref 0.30–0.70)

## 2022-05-03 LAB — PHOSPHORUS: Phosphorus: 4.3 mg/dL (ref 2.5–4.6)

## 2022-05-03 MED ORDER — FUROSEMIDE 10 MG/ML IJ SOLN
40.0000 mg | Freq: Once | INTRAMUSCULAR | Status: AC
  Administered 2022-05-03: 40 mg via INTRAVENOUS
  Filled 2022-05-03: qty 4

## 2022-05-03 MED ORDER — NEPRO/CARBSTEADY PO LIQD
237.0000 mL | Freq: Three times a day (TID) | ORAL | Status: DC
  Administered 2022-05-03 – 2022-05-06 (×7): 237 mL via ORAL

## 2022-05-03 MED ORDER — AMOXICILLIN-POT CLAVULANATE 500-125 MG PO TABS
1.0000 | ORAL_TABLET | Freq: Two times a day (BID) | ORAL | Status: AC
  Administered 2022-05-03 – 2022-05-04 (×3): 1 via ORAL
  Filled 2022-05-03 (×3): qty 1

## 2022-05-03 MED ORDER — HALOPERIDOL LACTATE 5 MG/ML IJ SOLN: 2.0000 mg | Freq: Once | INTRAMUSCULAR | Status: DC

## 2022-05-03 MED ORDER — METOPROLOL SUCCINATE ER 50 MG PO TB24: 50.0000 mg | ORAL_TABLET | Freq: Every day | ORAL | Status: DC

## 2022-05-03 MED ORDER — ADULT MULTIVITAMIN W/MINERALS CH
1.0000 | ORAL_TABLET | Freq: Every day | ORAL | Status: DC
  Administered 2022-05-04 – 2022-05-06 (×3): 1 via ORAL
  Filled 2022-05-03 (×3): qty 1

## 2022-05-03 NOTE — Progress Notes (Signed)
Patient ICU transfer. On droplet and contact precaution. 5L O2  High flow. Cardiac monitor on. On heparin drip. Handoff done. Family at bedside. All concerns addressed.

## 2022-05-03 NOTE — Assessment & Plan Note (Signed)
Na this AM 132, suspect hypervolemic given CXR yesterday.   --IV Lasix 40 mg x 1 today --Repeat BMP --Further diuresis as needed --Monitor volume status closely

## 2022-05-03 NOTE — Progress Notes (Signed)
Pt confused throughout the night attempting to get out of bed, pulling off leads and O2. Multiple PRNs administered without success. Neomia Glass, NP notified and sitter order given.

## 2022-05-03 NOTE — Progress Notes (Signed)
ANTICOAGULATION CONSULT NOTE  Pharmacy Consult for heparin infusion Indication: ACS/STEMI  No Known Allergies  Patient Measurements: Height: '5\' 9"'$  (175.3 cm) Weight: 68.9 kg (151 lb 14.4 oz) IBW/kg (Calculated) : 70.7 Heparin Dosing Weight: 69.8 kg  Vital Signs: Temp: 98.1 F (36.7 C) (11/07 0400) Temp Source: Axillary (11/07 0400) BP: 139/68 (11/07 0600) Pulse Rate: 75 (11/07 0600)  Labs: Recent Labs    05/01/22 0223 05/01/22 0649 05/01/22 0649 05/01/22 1759 05/02/22 0228 05/02/22 0624 05/02/22 0628 05/03/22 0441  HGB 10.4*  --   --   --   --   --  9.2* 8.7*  HCT 32.5*  --   --   --   --   --  27.8* 25.9*  PLT 285  --   --   --   --   --  262 268  HEPARINUNFRC  --  0.19*   < > 0.66 0.64  --   --  0.32  CREATININE 2.16*  --   --   --  2.24*  --   --  2.59*  TROPONINIHS 11,516* 10,343*  --   --   --  62,703*  --   --    < > = values in this interval not displayed.     Estimated Creatinine Clearance: 19.6 mL/min (A) (by C-G formula based on SCr of 2.59 mg/dL (H)).   Medical History: Past Medical History:  Diagnosis Date   Anxiety    CAD (coronary artery disease)    Dementia ([place])    Diabetes mellitus without complication (HCC)    GERD (gastroesophageal reflux disease)    History of heart artery stent    HLD (hyperlipidemia)    HTN (hypertension)    Parkinson disease    Prostate cancer ([place])    over 5 years ago   Squamous cell carcinoma of skin 06/08/2021   L vertex, EDC    Assessment: Pt is a 88 yo male presenting to ED w/ respiratory distress found with elevated troponin I and diagnoses w/ nSTEMI.  11/5  0649  HL=0.19 Subtherapeutic  inc drip 900 to 1100 u/hr 11/5 1759   HL=0.66 Therapeutic x1; 1100 un/hr 11/6 0228 HL 64, therapeutic x 2 11/7 0441 HL = 0.32, therapeutic X 3   Goal of Therapy:  Heparin level 0.3-0.7 units/ml Monitor platelets by anticoagulation protocol: Yes   Plan:  11/7:  HL @ 0441 = 0.32, therapeutic X 3 Will continue pt on  current rate and recheck HL on 11/8 with AM labs.   Jago Carton D 05/03/2022 6:10 AM

## 2022-05-03 NOTE — Evaluation (Signed)
Occupational Therapy Evaluation Patient Details Name: Dennis Serrano MRN: [id number] DOB: 30-Nov-1932 Today's Date: 05/03/2022   History of Present Illness Dennis Serrano is a 88 y.o. male with medical history significant for CAD, Alzheimer's disease, tension, hyperlipidemia, Parkinson's, persistent A-fib, type 2 diabetes, COPD, who presented to the ED on 05/21/2022 for evaluation of worsening shortness of breath. Patient was admitted for acute respiratory failure with hypoxia due to multifocal pneumonia.   Clinical Impression   Dennis Serrano was seen for OT evaluation this date. Prior to hospital admission, pt was ambulatory household distances without AD and required assist from PCA for bathing, IADLs. Pt lives with his son in a mobile home with ramped entrance. Per daughter, at baseline knows his name, can recognize family but not always relationship/names. Pt presents to acute OT demonstrating impaired ADL performance and functional mobility 2/2 decreased activity tolerance and functional strength/ROM/balance deficits. Pt currently requires SETUP + SUPERVISION feeding at bed level. Pt follows 1 step commands with cues. MAX A + HHA bed>chair t/f, +2 for lines mgmt. Pt unable to rise fully into standing from bed or chair but clears rear with assist. Pt would benefit from skilled OT to address noted impairments and functional limitations (see below for any additional details). Upon hospital discharge, recommend STR with plans to transition to LTC.       Recommendations for follow up therapy are one component of a multi-disciplinary discharge planning process, led by the attending physician.  Recommendations may be updated based on patient status, additional functional criteria and insurance authorization.   Follow Up Recommendations  Skilled nursing-short term rehab (<3 hours/day)    Assistance Recommended at Discharge Frequent or constant Supervision/Assistance  Patient can return home with the  following Two people to help with walking and/or transfers;Two people to help with bathing/dressing/bathroom;Help with stairs or ramp for entrance    Functional Status Assessment  Patient has had a recent decline in their functional status and demonstrates the ability to make significant improvements in function in a reasonable and predictable amount of time.  Equipment Recommendations  Other (comment) (defer)    Recommendations for Other Services       Precautions / Restrictions Precautions Precautions: Fall Restrictions Weight Bearing Restrictions: No      Mobility Bed Mobility Overal bed mobility: Needs Assistance Bed Mobility: Supine to Sit     Supine to sit: Min assist, HOB elevated          Transfers Overall transfer level: Needs assistance Equipment used: 1 person hand held assist Transfers: Bed to chair/wheelchair/BSC, Sit to/from Stand Sit to Stand: Max assist   Squat pivot transfers: Max assist, +2 safety/equipment       General transfer comment: unable to rise fully into standing from bed or chair but clears rear with assist      Balance Overall balance assessment: Needs assistance Sitting-balance support: No upper extremity supported, Feet supported Sitting balance-Leahy Scale: Good     Standing balance support: Bilateral upper extremity supported Standing balance-Leahy Scale: Poor                             ADL either performed or assessed with clinical judgement   ADL Overall ADL's : Needs assistance/impaired                                       General ADL  Comments: SETUP + SUPERVISION feeding at bed level. MAX A + HHA for simulated BSC t/f, +2 for lines mgmt. MAX A for LB access seated in chair      Pertinent Vitals/Pain Pain Assessment Pain Assessment: No/denies pain     Hand Dominance Right   Extremity/Trunk Assessment Upper Extremity Assessment Upper Extremity Assessment: Generalized weakness    Lower Extremity Assessment Lower Extremity Assessment: Generalized weakness       Communication Communication Communication: HOH   Cognition Arousal/Alertness: Awake/alert Behavior During Therapy: WFL for tasks assessed/performed, Flat affect Overall Cognitive Status: History of cognitive impairments - at baseline                                 General Comments: at baseline knows his name, can recognize family but not always relationship/names. follows 1 step commands with cues      Home Living Family/patient expects to be discharged to:: Private residence Living Arrangements: Children Available Help at Discharge: Family;Available PRN/intermittently;Personal care attendant Type of Home: Mobile home Home Access: Ramped entrance     Home Layout: One level               Home Equipment: Standard Walker          Prior Functioning/Environment Prior Level of Function : Needs assist;History of Falls (last six months)  Cognitive Assist : Mobility (cognitive);ADLs (cognitive)           Mobility Comments: no AD use, furniture walks, hx 4 falls in past 3 months ADLs Comments: PCA 3hrs/day 5 days/week - assists IADLs, sponge bathing, etc        OT Problem List: Decreased range of motion;Decreased strength;Decreased activity tolerance;Impaired balance (sitting and/or standing);Decreased safety awareness      OT Treatment/Interventions: Self-care/ADL training;Therapeutic exercise;Energy conservation;DME and/or AE instruction;Therapeutic activities;Patient/family education;Balance training    OT Goals(Current goals can be found in the care plan section) Acute Rehab OT Goals Patient Stated Goal: to transition to LTC OT Goal Formulation: With patient/family Time For Goal Achievement: 05/17/22 Potential to Achieve Goals: Fair ADL Goals Pt Will Perform Grooming: with set-up;with supervision;sitting Pt Will Perform Lower Body Dressing: with min  assist;sitting/lateral leans Pt Will Transfer to Toilet: with min assist;squat pivot transfer;bedside commode  OT Frequency: Min 2X/week    Co-evaluation              AM-PAC OT "6 Clicks" Daily Activity     Outcome Measure Help from another person eating meals?: A Little Help from another person taking care of personal grooming?: A Little Help from another person toileting, which includes using toliet, bedpan, or urinal?: A Lot Help from another person bathing (including washing, rinsing, drying)?: A Lot Help from another person to put on and taking off regular upper body clothing?: A Little Help from another person to put on and taking off regular lower body clothing?: A Lot 6 Click Score: 15   End of Session Equipment Utilized During Treatment: Gait belt Nurse Communication: Mobility status  Activity Tolerance: Patient tolerated treatment well Patient left: in chair;with call bell/phone within reach;with nursing/sitter in room;with family/visitor present  OT Visit Diagnosis: Muscle weakness (generalized) (M62.81);Other abnormalities of gait and mobility (R26.89)                Time: 8101-7510 OT Time Calculation (min): 19 min Charges:  OT General Charges $OT Visit: 1 Visit OT Evaluation $OT Eval Moderate Complexity: 1 Mod  Dondrea Clendenin  Spero Curb, M.S. OTR/L  05/03/22, 11:54 AM  ascom [phone]

## 2022-05-03 NOTE — Progress Notes (Signed)
Pt a/o x 1, VS stable, HF 5L. Family at the bedside, belongings with the family. Report was given. Pt transferred from ICU with the sitter at the bedside

## 2022-05-03 NOTE — Progress Notes (Signed)
[place] NOTE       Patient ID: Dennis Serrano MRN: [id number] DOB/AGE: 86-Apr-1934 86 y.o.  Admit date: 05/26/2022 Referring Physician Dr. Clayton Lefort Primary Physician Dr. Izell [facility]) Primary Cardiologist [facility] Reason for Consultation NSTEMI  HPI: Dennis Serrano is an 88yoM with a PMH of CAD with remote h/o stents, ?paroxysmal AF not on AC, dementia, DM2, Parkinson's, Prostate cancer s/p XRT (2012), HTN, HLD who presented to [facility] ED 05/07/2022 with shortness of breath with a cough, he required BiPAP in the ED noted leukocytosis to 13,000 and lactic acidosis on admission.  His initial troponin was elevated to 9500 with peak of 11,500, echocardiogram showed preserved LVEF without significant WMA's, G1 DD and moderate MR.  He is being treated for community-acquired pneumonia and treated conservatively for NSTEMI.  Respiratory panel was positive for rhinovirus  Interval history: -Sitting upright in recliner, he says his back hurts and he wants to get back in bed. Restless overnight, needing a sitter for redirection. -He is oriented to self and year. No chest pain, shortness of breath, peripheral edema. No family at bedside.  -renal function worsening, starting IV lasix today   Review of systems complete and found to be negative unless listed above     Past Medical History:  Diagnosis Date   Anxiety    CAD (coronary artery disease)    Dementia ([place])    Diabetes mellitus without complication (HCC)    GERD (gastroesophageal reflux disease)    History of heart artery stent    HLD (hyperlipidemia)    HTN (hypertension)    Parkinson disease    Prostate cancer ([place])    over 5 years ago   Squamous cell carcinoma of skin 06/08/2021   L vertex, EDC    Past Surgical History:  Procedure Laterality Date   APPENDECTOMY     CORONARY STENT PLACEMENT     EYE SURGERY     TRANSURETHRAL RESECTION OF PROSTATE      Medications Prior to Admission  Medication Sig  Dispense Refill Last Dose   amLODipine (NORVASC) 5 MG tablet Take 5 mg by mouth daily.   04/29/2022   aspirin 325 MG tablet Take 1 tablet (325 mg total) by mouth daily. (Patient taking differently: Take 81 mg by mouth daily.) 30 tablet 0 05/22/2022   carbidopa-levodopa (SINEMET IR) 25-100 MG tablet Take 1 tablet by mouth 3 (three) times daily.   04/29/2022   dilTIAZem HCl ER 420 MG TB24 Take 1 tablet by mouth daily.   05/06/2022   hydrALAZINE (APRESOLINE) 25 MG tablet Take 25 mg by mouth 3 (three) times daily.   05/01/2022   lisinopril (PRINIVIL,ZESTRIL) 40 MG tablet Take 40 mg by mouth daily.   05/24/2022   metFORMIN (GLUCOPHAGE) 1000 MG tablet Take 1,000 mg by mouth daily with breakfast.   05/15/2022   spironolactone (ALDACTONE) 25 MG tablet Take 25 mg by mouth daily.   04/29/2022   tamsulosin (FLOMAX) 0.4 MG CAPS capsule Take 0.4 mg by mouth daily.   05/09/2022   acetaminophen (TYLENOL) 500 MG tablet Take 500 mg by mouth every 8 (eight) hours.   prn at prn   celecoxib (CELEBREX) 200 MG capsule Take 200 mg by mouth daily. (Patient not taking: Reported on 05/01/2022)   Not Taking   cloNIDine (CATAPRES) 0.1 MG tablet Take 0.1 mg by mouth daily. (Patient not taking: Reported on 05/01/2022)   Not Taking   gemfibrozil (LOPID) 600 MG tablet  (Patient not taking: Reported on  05/01/2022)   Not Taking   mupirocin ointment (BACTROBAN) 2 % Apply once to twice a day to affected area scalp until healed. (Patient not taking: Reported on 05/01/2022) 22 g 0 Not Taking   ondansetron (ZOFRAN ODT) 4 MG disintegrating tablet Take 1 tablet (4 mg total) by mouth every 8 (eight) hours as needed for nausea or vomiting. (Patient not taking: Reported on 05/01/2022) 20 tablet 0 Not Taking   Social History   Socioeconomic History   Marital status: Widowed    Spouse name: Not on file   Number of children: Not on file   Years of education: Not on file   Highest education level: Not on file  Occupational History   Not on file   Tobacco Use   Smoking status: Former    Types: Cigarettes    Quit date: 76    Years since quitting: 43.8   Smokeless tobacco: Never  Substance and Sexual Activity   Alcohol use: No   Drug use: No   Sexual activity: Not on file  Other Topics Concern   Not on file  Social History Narrative   Not on file   Social Determinants of Health   Financial Resource Strain: Not on file  Food Insecurity: Not on file  Transportation Needs: Not on file  Physical Activity: Not on file  Stress: Not on file  Social Connections: Not on file  Intimate Partner Violence: Not on file    Family History  Problem Relation Age of Onset   Heart disease Mother    Heart disease Father    Cancer Sister    Cancer Brother      Vitals:   05/03/22 0400 05/03/22 0600 05/03/22 1400 05/03/22 1518  BP: (!) 133/54 139/68 (!) 118/52 (!) 110/49  Pulse: 80 75 66 71  Resp: 17 (!) '24 17 16  '$ Temp: 98.1 F (36.7 C)   98.4 F (36.9 C)  TempSrc: Axillary     SpO2: 97% 97% 98% 97%  Weight:      Height:        PHYSICAL EXAM General: Elderly and frail-appearing Caucasian male, in no acute distress.  Sitting upright in recliner, sitter at bedside. HEENT:  Normocephalic and atraumatic.  Extremely hard of hearing. Neck:  No JVD.  Lungs: Normal respiratory effort on oxygen by nasal cannula.  Decreased breath sounds with out appreciable crackles or wheezes.  Occasional cough. Heart: HRRR . Normal S1 and S2 without gallops or murmurs.  Abdomen: Non-distended appearing.  Msk: Normal strength and tone for age. Extremities: Warm and well perfused. No clubbing, cyanosis.  No significant peripheral edema.  Neuro: Alert and oriented to self and year but not place or situation. Psych:  Answers simple questions  Labs: Basic Metabolic Panel: Recent Labs    05/02/22 0228 05/03/22 0441  NA 133* 132*  K 4.9 4.5  CL 104 103  CO2 17* 17*  GLUCOSE 199* 206*  BUN 58* 80*  CREATININE 2.24* 2.59*  CALCIUM 8.5* 8.4*   MG 1.9 2.1  PHOS  --  4.3   Liver Function Tests: Recent Labs    05/22/2022 2326 05/01/22 0223  AST 91* 82*  ALT <5 8  ALKPHOS 117 106  BILITOT 0.4 0.4  PROT 7.5 7.0  ALBUMIN 3.8 3.3*   No results for input(s): "LIPASE", "AMYLASE" in the last 72 hours. CBC: Recent Labs    05/19/2022 2326 05/01/22 0223 05/02/22 0628 05/03/22 0441  WBC 13.6*   < > 13.0* 10.1  NEUTROABS 12.1*  --   --   --   HGB 11.1*   < > 9.2* 8.7*  HCT 34.7*   < > 27.8* 25.9*  MCV 87.6   < > 85.0 83.5  PLT 263   < > 262 268   < > = values in this interval not displayed.   Cardiac Enzymes: Recent Labs    05/01/22 0223 05/01/22 0649 05/02/22 0624  TROPONINIHS 11,516* 10,343* 10,765*   BNP: Recent Labs    05/18/2022 2326  BNP 3,035.8*   D-Dimer: No results for input(s): "DDIMER" in the last 72 hours. Hemoglobin A1C: No results for input(s): "HGBA1C" in the last 72 hours. Fasting Lipid Panel: No results for input(s): "CHOL", "HDL", "LDLCALC", "TRIG", "CHOLHDL", "LDLDIRECT" in the last 72 hours. Thyroid Function Tests: No results for input(s): "TSH", "T4TOTAL", "T3FREE", "THYROIDAB" in the last 72 hours.  Invalid input(s): "FREET3" Anemia Panel: No results for input(s): "VITAMINB12", "FOLATE", "FERRITIN", "TIBC", "IRON", "RETICCTPCT" in the last 72 hours.   Radiology: NM Pulmonary Perfusion  Result Date: 05/02/2022 CLINICAL DATA:  Short of breath. EXAM: NUCLEAR MEDICINE PERFUSION LUNG SCAN TECHNIQUE: Perfusion images were obtained in multiple projections after intravenous injection of radiopharmaceutical. RADIOPHARMACEUTICALS:  4.26 mCi Tc-37mMAA COMPARISON:  Chest radiograph 05/12/2022 FINDINGS: Exam is limited by patient's arms at side. Patient was unable to elevate arms for exam. Decreased region perfusion to the RIGHT lung apex on multiple projections. Perfusion defect seen on the RPO projection is favored related to arm attenuation. No wedge-shaped peripheral perfusion defect.s Chest  radiograph demonstrates bibasilar airspace opacity and RIGHT effusion. RIGHT upper lobe relatively clear. IMPRESSION: Concern for RIGHT upper lobe pulmonary embolism. Intermediate probability. Limited exam due to arms at side. Electronically Signed   By: SSuzy BouchardM.D.   On: 05/02/2022 16:24   DG Chest Port 1 View  Result Date: 05/02/2022 CLINICAL DATA:  Acute respiratory failure, hypoxia EXAM: PORTABLE CHEST 1 VIEW COMPARISON:  Previous studies including the examination done on 04/30/2022 FINDINGS: Transverse diameter of heart is increased. There is interval worsening of pulmonary vascular congestion. Increased interstitial and alveolar markings are seen in parahilar regions and lower lung fields. Small to moderate bilateral pleural effusions are seen with interval increase. Evaluation of lower lung fields for focal infiltrates is limited by the bilateral effusions. There is no pneumothorax. IMPRESSION: There is interval worsening of pulmonary vascular congestion suggesting worsening CHF. There is interval increase in opacification of both parahilar regions and lower lung fields suggesting increase in bilateral pleural effusions and worsening pulmonary edema. Electronically Signed   By: PElmer PickerM.D.   On: 05/02/2022 15:35   ECHOCARDIOGRAM COMPLETE  Result Date: 05/02/2022    ECHOCARDIOGRAM REPORT   Patient Name:   JDURK CARMENDate of Exam: 05/01/2022 Medical Rec #:  [id number]    Height:       69.0 in Accession #:    [id number]   Weight:       151.9 lb Date of Birth:  5Aug 06, 1934    BSA:          1.838 m Patient Age:    89years      BP:           136/72 mmHg Patient Gender: M             HR:           75 bpm. Exam Location:  [facility] Procedure: 2D Echo Indications:     Elevated Troponin  History:         Patient has prior history of Echocardiogram examinations, most                  recent 10/04/2016.  Sonographer:     Kathlen Brunswick RDCS Referring Phys:  [id number] [place] M ECKSTAT  Diagnosing Phys: Yolonda Kida MD  Sonographer Comments: Suboptimal parasternal window. Image acquisition challenging due to patient body habitus. IMPRESSIONS  1. Left ventricular ejection fraction, by estimation, is 50 to 55%. The left ventricle has low normal function. The left ventricle has no regional wall motion abnormalities. There is mild concentric left ventricular hypertrophy. Left ventricular diastolic parameters are consistent with Grade I diastolic dysfunction (impaired relaxation).  2. Right ventricular systolic function is normal. The right ventricular size is normal.  3. Left atrial size was mildly dilated.  4. Right atrial size was mildly dilated.  5. The mitral valve is normal in structure. Moderate mitral valve regurgitation.  6. Tricuspid valve regurgitation is mild to moderate.  7. The aortic valve is normal in structure. Aortic valve regurgitation is trivial. FINDINGS  Left Ventricle: Left ventricular ejection fraction, by estimation, is 50 to 55%. The left ventricle has low normal function. The left ventricle has no regional wall motion abnormalities. The left ventricular internal cavity size was normal in size. There is mild concentric left ventricular hypertrophy. Left ventricular diastolic parameters are consistent with Grade I diastolic dysfunction (impaired relaxation). Right Ventricle: The right ventricular size is normal. No increase in right ventricular wall thickness. Right ventricular systolic function is normal. Left Atrium: Left atrial size was mildly dilated. Right Atrium: Right atrial size was mildly dilated. Pericardium: There is no evidence of pericardial effusion. Mitral Valve: The mitral valve is normal in structure. Moderate mitral valve regurgitation. Tricuspid Valve: The tricuspid valve is normal in structure. Tricuspid valve regurgitation is mild to moderate. Aortic Valve: The aortic valve is normal in structure. Aortic valve regurgitation is trivial. Aortic  regurgitation PHT measures 637 msec. Aortic valve peak gradient measures 5.2 mmHg. Pulmonic Valve: The pulmonic valve was normal in structure. Pulmonic valve regurgitation is not visualized. Aorta: The ascending aorta was not well visualized. IAS/Shunts: No atrial level shunt detected by color flow Doppler.  LEFT VENTRICLE PLAX 2D LVIDd:         4.90 cm     Diastology LVIDs:         3.60 cm     LV e' medial:    4.19 cm/s LV PW:         1.40 cm     LV E/e' medial:  19.4 LV IVS:        1.20 cm     LV e' lateral:   7.56 cm/s LVOT diam:     2.00 cm     LV E/e' lateral: 10.7 LV SV:         38 LV SV Index:   21 LVOT Area:     3.14 cm  LV Volumes (MOD) LV vol d, MOD A2C: 90.0 ml LV vol d, MOD A4C: 53.0 ml LV vol s, MOD A2C: 52.0 ml LV vol s, MOD A4C: 34.5 ml LV SV MOD A2C:     38.0 ml LV SV MOD A4C:     53.0 ml LV SV MOD BP:      27.1 ml RIGHT VENTRICLE RV Basal diam:  3.70 cm RV S prime:     7.56 cm/s TAPSE (M-mode): 1.4 cm LEFT ATRIUM  Index        RIGHT ATRIUM           Index LA diam:        4.40 cm 2.39 cm/m   RA Area:     14.20 cm LA Vol (A2C):   51.7 ml 28.13 ml/m  RA Volume:   35.60 ml  19.37 ml/m LA Vol (A4C):   40.0 ml 21.76 ml/m LA Biplane Vol: 47.7 ml 25.95 ml/m  AORTIC VALVE                 PULMONIC VALVE AV Area (Vmax): 1.76 cm     PV Vmax:       0.97 m/s AV Vmax:        114.00 cm/s  PV Peak grad:  3.8 mmHg AV Peak Grad:   5.2 mmHg LVOT Vmax:      63.95 cm/s LVOT Vmean:     40.350 cm/s LVOT VTI:       0.120 m AI PHT:         637 msec  AORTA Ao Root diam: 2.60 cm MITRAL VALVE                  TRICUSPID VALVE MV Area (PHT): 3.89 cm       TV Peak grad:   36.8 mmHg MV Decel Time: 195 msec       TV Vmax:        3.04 m/s MR Peak grad:    110.2 mmHg   TR Peak grad:   41.7 mmHg MR Mean grad:    66.0 mmHg    TR Vmax:        323.00 cm/s MR Vmax:         525.00 cm/s MR Vmean:        373.0 cm/s   SHUNTS MR PISA:         1.57 cm     Systemic VTI:  0.12 m MR PISA Eff ROA: 9 mm        Systemic Diam:  2.00 cm MR PISA Radius:  0.50 cm MV E velocity: 81.20 cm/s MV A velocity: 97.50 cm/s MV E/A ratio:  0.83 Dennis D Callwood MD Electronically signed by Yolonda Kida MD Signature Date/Time: 05/02/2022/3:02:48 PM    Final    DG Chest Port 1 View  Result Date: 05/21/2022 CLINICAL DATA:  Respiratory distress EXAM: PORTABLE CHEST 1 VIEW COMPARISON:  Radiograph 08/24/2017 FINDINGS: Hazy airspace and interstitial opacities greatest in the right mid and lower lung. Possible small right pleural effusion. No pneumothorax. Stable cardiomediastinal silhouette. Aortic atherosclerotic calcification. No acute osseous abnormality. IMPRESSION: Nonspecific hazy opacities greatest in the right mid and lower lung can be seen with infection or edema. Electronically Signed   By: Placido Sou M.D.   On: 05/17/2022 23:38    TELEMETRY reviewed by me (LT) 05/03/2022 : Sinus rhythm 70s to 80s  EKG reviewed by me: Sinus rhythm 70s without acute ST or T wave abnormalities  Data reviewed by me (LT) 05/03/2022: Hospitalist progress note admission H&P as those vitals telemetry CBC BMP troponins BNP  Principal Problem:   Acute hypoxemic respiratory failure (HCC) Active Problems:   Severe sepsis (HCC)   CAP (community acquired pneumonia)   CAD (coronary artery disease)   COPD with acute exacerbation (HCC)   NSTEMI (non-ST elevated myocardial infarction) ([place])   AKI (acute kidney injury) ([place])   Known medical problems   Goals of care, counseling/discussion   Delirium  due to multiple etiologies, acute, hyperactive    ASSESSMENT AND PLAN:  Dennis Serrano is an 3yoM with a PMH of CAD with remote h/o stents, ?paroxysmal AF not on AC, dementia, DM2, Parkinson's, Prostate cancer s/p XRT (2012), HTN, HLD who presented to [facility] ED 05/08/2022 with shortness of breath with a cough, he required BiPAP in the ED noted leukocytosis to 13,000 and lactic acidosis on admission.  His initial troponin was elevated to 9500 with peak of 11,500,  echocardiogram showed preserved LVEF without significant WMA's, G1 DD and moderate MR.  He is being treated for community-acquired pneumonia and treated conservatively for NSTEMI.  Respiratory panel was positive for rhinovirus.  #Acute hypoxic respiratory failure #Multifocal pneumonia, rhinovirus -Remains on supplemental oxygen at 5 L HFNC, given empiric antibiotics.  VQ scan with concern for right upper lobe PE although indeterminate and limited exam due to arm positioning.  #NSTEMI versus severe demand ischemia Troponin trended 9500-11500-10300-10700.  Without chest pain or concerning EKG changes.  He is continued on IV heparin, also on this out of concern for pulmonary embolus as above.  Echo resulted with preserved LVEF 50-55%, G1 DD, and moderate MR. No WMAs.  -Continue aspirin 81 mg daily, will add clopidogrel 75 mg daily tomorrow -Heparin drip was started at 0110 on 11/5, continue this for now.   -His home antihypertensives appear to include diltiazem 420 mg daily, hydralazine 25 mg 3 times daily -We will switch diltiazem to metoprolol XL.  Hold ACE inhibitor/ARB, MRA, SGLT2 with AKI as below.  -Continue conservative management from a cardiac standpoint in the setting of the patient's advanced age, absence of chest pain, and worsening renal function as below  #Chronic HFpEF EF 50-55% BNP on admission 3000, does not appear significantly volume overloaded on exam.  Received sepsis fluids on admission, these have been stopped out of concern for hypervolemia.  Still has a significant oxygen requirement of 5 L HFNC today -Agree with Lasix 40 mg IV x1, will assess response and possibly dose further if his renal function allows or improves with this  #AKI Progressively worsened since admission with current BUN/creatinine 80/2.59 and GFR 23.  This patient's plan of care was discussed and created with Dr. Saralyn Pilar and he is in agreement.  Signed: Tristan Schroeder , PA-C 05/03/2022, 3:24  PM [facility]

## 2022-05-03 NOTE — Assessment & Plan Note (Addendum)
Pt appears euvolemic on exam, but cxr 11/6 with worsening signs of pulmonary edema and vascular congestions and pleural effusions, very elevated BNP. Echo showed EF 50-55% with grade 1 diastolic dysfunction, moderate MR, mild-moderate TR. --Lasix 40 mg IV x 1 today --Strict I/O's and daily weights --Cardiology following

## 2022-05-03 NOTE — Assessment & Plan Note (Signed)
NM perfusion study intermediate probability for RUL PE.  No sign of right-heart strain on echo.  O2 needs are improving.  --Check LE doppler U/S --Continue heparin drip --Family/guardians discussing risks/benefits of long term anticoagulation --May need IVC filter --Monitor hemodynamics and oxygen requirements

## 2022-05-03 NOTE — Progress Notes (Signed)
Speech Language Pathology Treatment: Dysphagia  Patient Details Name: Dennis Serrano MRN: [id number] DOB: [birth year] Today's Date: 05/03/2022 Time: 7425-9563 SLP Time Calculation (min) (ACUTE ONLY): 40 min  Assessment / Plan / Recommendation Clinical Impression  Pt seen today for ongoing assessment of toleration of diet; pt is currently on a dysphagia level 1 w/ thin liquids w/ aspiration precautions. Pt awakened to participate in the po trials. Pt was less talkative today vs at BSE. No family present. Pt has Baseline Advanced Dementia. Pt on [place] O2 support. Afebrile. WBC 10.1. He was able to feed himself w/ setup support -- hold cup to drink. He appeared to benefit from reducing distractions during the session.  OF NOTE: Pt exhibited a Congested cough PRIOR to any po's given w/ moving about in bed.    Pt appears to present w/ oral phase dysphagia in setting of declined Cognitive status; Baseline advanced Dementia. ANY oral phase deficits can impact the pharyngeal phase of swallowing thus increase risk for aspiration. Cognitive decline can impact overall awareness/timing of swallow and safety during po tasks which increases risk for aspiration, choking. Pt's risk for aspiration/aspiration pneumonia can be reduced when following general aspiration precautions, given support w/ feeding at meals, and using a modified diet consistency of blended foods d/t Edentulous status and ease of oral phase/clearing.  OF NOTE: pt also has declined Respiratory status -- see most recent CXR indicating "interval worsening of pulmonary vascular congestion  suggesting worsening CHF. There is interval increase in  opacification of both parahilar regions and lower lung fields  suggesting increase in bilateral pleural effusions and worsening  pulmonary edema.". Any Pulmonary decline can impact timing of swallowing thus increase risk for aspiration to occur.   Pt consumed several trials of ice chips and thin liquids via straw,  tsp of puree w/ No immediate, overt clinical s/s of aspiration noted: no decline in vocal quality; no immediate cough, and no decline in respiratory status during/post trials -- O2 sats remained ~94%. A Delayed cough was noted b/t trials of thin liquids x1; no other decline in status. Oral phase was grossly adequate for bolus management and oral clearing of the boluses given -- min increased oral phase Time in A-P transfer w/ trials noted. Noted he often closed eyes and seemed drowsy BETWEEN trials when not given MOD verbal/tactile stim. This Lethargy can increase risk for aspiration/choking.   Pt attempted self-feeding but required MOD support and guidance. He was able to hold cup when drinking somewhat which improves safety of swallowing.         In setting of baseline Dementia/Cognitive decline, Edentulous status, and declined Pulmonary status all which increase risk for dysphagia and aspiration, recommend continue the dysphagia level 1(PUREED foods moistened for ease of oral phase) w/ thin liquids -- monitor straw use and use CUP as indicated for controlled sips; aspiration and REFLUX precautions; reduce Distractions during meals and engage pt during meals for self-feeding. Pills Crushed in Puree for safer swallowing. Support w/ feeding at meals and give Rest Breaks to lessen any WOB w/ po tasks. ST services will f/u w/ toleration of diet next 1-2 days. MD/NSG updated.  ST services recommends follow w/ Palliative Care for [place] and education re: impact of Cognitive decline/Dementia and Pulmonary decline on swallowing. Suspect pt is close to/at his baseline. Precautions posted in room. ST services will monitor status while admitted and provide education as needed.       HPI HPI: Pt is a 88 y.o. male with medical  history significant for CAD, NSTEMI, AKI, Alzheimer's disease, tension, hyperlipidemia, Parkinson's, persistent A-fib, type 2 diabetes, COPD, who presents with shortness of breath.  On interview in  the ED, patient reports that he is feeling short of breath and has had a cough (unable to give a timeframe) but denies any other symptoms.  BIPAP had been placed by EMS.  Currently on [place] O2 support.  CXR on 05/02/22 revealed: "There is interval worsening of pulmonary vascular congestion  suggesting worsening CHF. There is interval increase in  opacification of both parahilar regions and lower lung fields  suggesting increase in bilateral pleural effusions and worsening  pulmonary edema.".      SLP Plan  Continue with current plan of care      Recommendations for follow up therapy are one component of a multi-disciplinary discharge planning process, led by the attending physician.  Recommendations may be updated based on patient status, additional functional criteria and insurance authorization.    Recommendations  Diet recommendations: Dysphagia 1 (puree);Thin liquid Liquids provided via: Cup;Straw (monitor) Medication Administration: Crushed with puree Supervision: Patient able to self feed;Staff to assist with self feeding;Full supervision/cueing for compensatory strategies Compensations: Minimize environmental distractions;Slow rate;Small sips/bites;Lingual sweep for clearance of pocketing;Follow solids with liquid;Multiple dry swallows after each bite/sip Postural Changes and/or Swallow Maneuvers: Out of bed for meals;Seated upright 90 degrees;Upright 30-60 min after meal                General recommendations:  (Palliative care consult) Oral Care Recommendations: Oral care BID;Oral care before and after PO;Staff/trained caregiver to provide oral care Follow Up Recommendations: Follow physician's recommendations for discharge plan and follow up therapies Assistance recommended at discharge: Frequent or constant Supervision/Assistance (feeding support) SLP Visit Diagnosis: Dysphagia, oral phase (R13.11) (baseline advanced Dementia) Plan: Continue with current plan of care             Orinda Kenner, [place], [place]; [place] [phone] (ascom) Elayah Klooster  05/03/2022, 3:33 PM

## 2022-05-03 NOTE — Progress Notes (Addendum)
Progress Note   Patient: Dennis Serrano UMP:[id number] DOB: Dec 26, 1932 DOA: 05/19/2022     2 DOS: the patient was seen and examined on 05/03/2022   Brief hospital course: Mansour Balboa is a 88 y.o. male with medical history significant for CAD, Alzheimer's disease, tension, hyperlipidemia, Parkinson's, persistent A-fib, type 2 diabetes, COPD, who presented to the ED around 11 pm on 05/16/2022 for evaluation of worsening shortness of breath.    Patient was admitted for acute respiratory failure with hypoxia due to multifocal pneumonia.  He was started on IV antibiotics for working diagnosis of pneumonia, and IV heparin for possible ACS, with hs-troponin elevated (peaked above 11k) in the absence of chest pain.  Cardiology consulted.   11/5: on 12 L/min HFNC oxygen, otherwise stable, no CP  11/6: O2 needs improving, weaned 6 >> 4 L/min HFNC O2 this morning.  Delirium noted overnight.    11/7: remains on 5 L/min O2.  Stable for transfer to floor.    Assessment and Plan: * Acute hypoxemic respiratory failure (HCC) Due to multifocal pneumonia.  Initially required BiPAP for respiratory support, transitioned to high-flow [place] oxygen.  Respiratory viral panel positive for Rhinovirus.  NM perfusion study 11/6, likely has RUL PE. - Wean oxygen as tolerated, keep sat > 88% -- Treat PNA and PE as outlined  CAP (community acquired pneumonia) Ceftriaxone and azithromycin - continue Respiratory viral panel +Rhinovirus Supportive care with antitussives PRN, pulmonary hygiene, supplement O2 SLP consult for swallow evaluation  Severe sepsis ([place]) POA with tachypnea, hypoxia, tachycardia, and evidence of endorgan damage with lactic acidosis and AKI.  Sepsis protocol initiated. - Antibiotics per CAP protocol - Follow-up urine and blood cultures  Acute on chronic diastolic CHF (congestive heart failure) (HCC) Pt appears euvolemic on exam, but cxr 11/6 with worsening signs of pulmonary edema and vascular  congestions and pleural effusions, very elevated BNP. Echo showed EF 50-55% with grade 1 diastolic dysfunction, moderate MR, mild-moderate TR. --Lasix 40 mg IV x 1 today --Strict I/O's and daily weights --Cardiology following   Hyponatremia Na this AM 132, suspect hypervolemic given CXR yesterday.   --IV Lasix 40 mg x 1 today --Repeat BMP --Further diuresis as needed --Monitor volume status closely  Acute pulmonary embolism (HCC) NM perfusion study intermediate probability for RUL PE.  No sign of right-heart strain on echo.  O2 needs are improving.  --Check LE doppler U/S --Continue heparin drip --Family/guardians discussing risks/benefits of long term anticoagulation --May need IVC filter --Monitor hemodynamics and oxygen requirements  Delirium due to multiple etiologies, acute, hyperactive Overnight 11/5-6: pt restless, attempting to get up out of bed, pulling at IV's.  Family report underlying dementia.  Suspect delirium in setting of infection and hypoxia, in addition to ICU setting.  --Delirium precautions --Maintain wake/sleep cycle --PT/OT  Goals of care, counseling/discussion Discussed code status with patient's family / surrogate decision makers.  They request DNR and confirm no CPR, no defibrillation, no ACLS meds, no intubation in the event of cardiac and or respiratory arrest.   --DNR order placed.  Known medical problems Hypertension-continue amlodipine, diltiazem, hydralazine.  Hold lisinopril and spironolactone in setting of AKI. CAD-continue aspirin 81 mg Parkinson's-continue Sinemet DM2-sliding scale insulin correction factor BPH-continue Flomax  AKI (acute kidney injury) ([place]) Cr on admission 2.20, up from 1.54 last year, 1.15 two years ago.  Likely prerenal in setting of sepsis and NSTEMI --Treated with fluid resuscitation which has been stopped for concern of volume overload -- Hold lisinopril and spironolactone -- Avoid  nephrotoxins -- Daily  BMP  NSTEMI (non-ST elevated myocardial infarction) (HCC) No chest pain and no significant EKG changes.  Last echocardiogram done in April 2018, mild LVH was only abnormality seen.  This seems like severe demand ischemia, no chest pain complaints.   - Troponin peaked above 11k -- Continue heparin drip -- [facility] following, Dr. Clayborn Bigness - Echocardiogram with no WMA's --Further eval per cardiology  COPD with acute exacerbation [facility]) Prior chest CTs that have shown severe emphysema.  ON admission, had diffuse wheezing with moderately poor air movement appreciated on exam.  Started on IV steroids.  Initially was on BiPAP. - Antibiotics as above -- Continue prednisone --Bronchodilators  CAD (coronary artery disease) No active chest pain.  Does have NSTEMI felt due to demand ischemia.  Management per cardiology.  --On heparin drip --Continue ASA        Subjective: Pt seen in stepdown with sitter at bedside.  He is in recliner an asking to return to bed.  He denies chest pain, SOB or other complaints.  Says "I've been better" when asked how he's feeling.  Sitter for safety due to pt being restless, attempting to get up, pulls at IV etc.    Physical Exam: Vitals:   05/03/22 0400 05/03/22 0600 05/03/22 1400 05/03/22 1518  BP: (!) 133/54 139/68 (!) 118/52 (!) 110/49  Pulse: 80 75 66 71  Resp: 17 (!) '24 17 16  '$ Temp: 98.1 F (36.7 C)   98.4 F (36.9 C)  TempSrc: Axillary     SpO2: 97% 97% 98% 97%  Weight:      Height:       General exam: awake, alert, no acute distress, chronically ill-appearing HEENT: moist mucus membranes, hearing grossly normal  Respiratory system: CTAB, no wheezes or rhonchi, diminished bases, normal respiratory effort, on 5 L/min HFNC O2. Cardiovascular system: normal S1/S2, RRR, no pedal edema.   Gastrointestinal system: soft, NT, ND Central nervous system: no gross focal neurologic deficits, normal speech Extremities: moves all, no edema, normal  tone Skin: dry, intact, normal temperature Psychiatry: normal mood, congruent affect, judgement and insight appear normal   Data Reviewed:  Notable labs: Worsening renal function Cr 2.59, Na declining 132, bicarb 17, normal phos and mag.  Hbg 8.7 from 9.2.  WBC normalized 10.1.    Chest xray 11/6 - worsening pulmonary vascular congestion (suggests CHF), increased bilateral pleural effusions.  Respiratory viral panel positive for rhinovirus  NM pulmonary perfusion 11/6 -- intermediate probability for RUL pulmonary embolus  Echocardiogram -- EF 63-84%, grade 1 diastolic dysfunction, moderate MR, mild-moderate TR   Family Communication: granddaughter updated by phone this afternoon.  She will discuss anticoagulation with patient's daughter (they are both his guardians).    Disposition: Status is: Inpatient Remains inpatient appropriate because: Severity of illness remaining on IV therapies and ongoing evaluation as outlined above    Planned Discharge Destination:  Home with home health versus SNF pending PT OT evaluations when patient is able to participate    Time spent: 45 minutes  Author: Ezekiel Slocumb, DO 05/03/2022 4:22 PM  For on call review [email or web address].

## 2022-05-03 NOTE — Evaluation (Signed)
Physical Therapy Evaluation Patient Details Name: Dennis Serrano MRN: [id number] DOB: 10/30/1932 Today's Date: 05/03/2022  History of Present Illness  Pt is an 88 y.o. male presenting to hospital 05/19/2022 with c/o cough and respiratory distress.  Pt admitted with acute hypoxemic respiratory failure--(+) rhinovirus, CAP, severe sepsis, AKI, NSTEMI, and acute COPD exacerbation.  Imaging showing concern for R upper lobe PE 11/6.  PMH includes Parkinson's disease, CAD, DM, anxiety, htn, prostate CA, Parkinson's disease, a-fib, and dementia.  Clinical Impression  Prior to hospital admission, pt was modified independent ambulating household distances (used wall for guidance/support); used w/c in community; lives with his son (who is available 24/7) in 1 level home with ramp to enter; has aide 5 days a week (3 hours each day).  During session pt oriented to name and DOB but not location (per pt's granddaughter pt able to state location as well on good days).  No c/o pain during session.  Currently pt is max assist with squat pivot transfer recliner to bed (2nd assist for lines; pt's knees flexed and unable to stand upright with max assist x1) and mod to max assist to lay down in bed.  Pt would benefit from skilled PT to address noted impairments and functional limitations (see below for any additional details).  Upon hospital discharge, pt would benefit from SNF.    Recommendations for follow up therapy are one component of a multi-disciplinary discharge planning process, led by the attending physician.  Recommendations may be updated based on patient status, additional functional criteria and insurance authorization.  Follow Up Recommendations Skilled nursing-short term rehab (<3 hours/day) Can patient physically be transported by private vehicle: No    Assistance Recommended at Discharge Frequent or constant Supervision/Assistance  Patient can return home with the following  Two people to help with walking  and/or transfers;Two people to help with bathing/dressing/bathroom;Assistance with cooking/housework;Direct supervision/assist for medications management;Direct supervision/assist for financial management;Assist for transportation;Help with stairs or ramp for entrance    Equipment Recommendations BSC/3in1;Wheelchair (measurements PT);Wheelchair cushion (measurements PT);Hospital bed;Other (comment) (hoyer lift)  Recommendations for Other Services       Functional Status Assessment Patient has had a recent decline in their functional status and demonstrates the ability to make significant improvements in function in a reasonable and predictable amount of time.     Precautions / Restrictions Precautions Precautions: Fall Precaution Comments: Aspiration Restrictions Weight Bearing Restrictions: No      Mobility  Bed Mobility Overal bed mobility: Needs Assistance Bed Mobility: Sit to Supine       Sit to supine: Mod assist, Max assist, HOB elevated   General bed mobility comments: assist for trunk and B LE's; vc's for technique    Transfers Overall transfer level: Needs assistance Equipment used: None Transfers: Sit to/from Stand, Bed to chair/wheelchair/BSC Sit to Stand: Max assist     Squat pivot transfers: Max assist, +2 safety/equipment     General transfer comment: pt unable to come to full stand (x2 trials) with UE support and max assist; squat pivot transfer recliner to bed with max assist (2nd assist for lines)    Ambulation/Gait               General Gait Details: not appropriate at this time  Stairs            Wheelchair Mobility    Modified Rankin (Stroke Patients Only)       Balance Overall balance assessment: Needs assistance Sitting-balance support: No upper extremity supported, Feet supported  Sitting balance-Leahy Scale: Fair Sitting balance - Comments: steady static sitting   Standing balance support: Bilateral upper extremity  supported Standing balance-Leahy Scale: Poor                               Pertinent Vitals/Pain Pain Assessment Pain Assessment: Faces Faces Pain Scale: No hurt Pain Intervention(s): Limited activity within patient's tolerance, Monitored during session, Repositioned Vitals stable and WFL on supplemental O2 throughout treatment session.    Home Living Family/patient expects to be discharged to:: Private residence Living Arrangements: Children (pt's son) Available Help at Discharge: Family;Available 24 hours/day;Personal care attendant (pt's son available 24/7; has aide 5 days a week (3 hours each day)) Type of Home: Mobile home Home Access: Ramped entrance       Home Layout: One level Home Equipment: Standard Walker;Grab bars - tub/shower;Grab bars - toilet;Shower seat Additional Comments: PLOF and home information obtained via legal guardian Dennis Serrano (via phone call)    Prior Function Prior Level of Function : Needs assist;History of Falls (last six months)  Cognitive Assist : Mobility (cognitive);ADLs (cognitive)           Mobility Comments: No AD use for ambulation (uses walls for guidance--appears to furniture walk); uses w/c in community; h/o 4 falls in past 3 months.  Pt's granddaughter reports pt appears to have fallen getting OOB Saturday (at home prior to coming to hospital). ADLs Comments: PCA 3hrs/day 5 days/week - assists IADLs, sponge bathing, etc     Hand Dominance   Dominant Hand: Right    Extremity/Trunk Assessment   Upper Extremity Assessment Upper Extremity Assessment: Generalized weakness;Difficult to assess due to impaired cognition    Lower Extremity Assessment Lower Extremity Assessment: Generalized weakness;Difficult to assess due to impaired cognition    Cervical / Trunk Assessment Cervical / Trunk Assessment: Other exceptions Cervical / Trunk Exceptions: forward head/shoulders  Communication   Communication: HOH  Cognition  Arousal/Alertness: Awake/alert Behavior During Therapy: Flat affect Overall Cognitive Status: History of cognitive impairments - at baseline                                 General Comments: At baseline pt knows his name/DOB (also location on good days)        General Comments  Nursing cleared pt for participation in physical therapy.  Pt agreeable to PT session.  Sitter present during session.    Exercises     Assessment/Plan    PT Assessment Patient needs continued PT services  PT Problem List Decreased strength;Decreased activity tolerance;Decreased balance;Decreased mobility       PT Treatment Interventions DME instruction;Gait training;Functional mobility training;Therapeutic activities;Therapeutic exercise;Balance training;Patient/family education    PT Goals (Current goals can be found in the Care Plan section)  Acute Rehab PT Goals Patient Stated Goal: to improve mobility PT Goal Formulation: With patient Time For Goal Achievement: 05/17/22 Potential to Achieve Goals: Fair    Frequency Min 2X/week     Co-evaluation               AM-PAC PT "6 Clicks" Mobility  Outcome Measure Help needed turning from your back to your side while in a flat bed without using bedrails?: A Lot Help needed moving from lying on your back to sitting on the side of a flat bed without using bedrails?: A Lot Help needed moving to and from a  bed to a chair (including a wheelchair)?: A Lot Help needed standing up from a chair using your arms (e.g., wheelchair or bedside chair)?: A Lot Help needed to walk in hospital room?: Total Help needed climbing 3-5 steps with a railing? : Total 6 Click Score: 10    End of Session Equipment Utilized During Treatment: Gait belt Activity Tolerance: Patient limited by fatigue Patient left: in bed;with call bell/phone within reach;with bed alarm set;Other (comment) (sitter left for lunch (cleared by NT and nurse)--NT doing safety  rounds and nurse in view of pt) Nurse Communication: Mobility status;Precautions PT Visit Diagnosis: Other abnormalities of gait and mobility (R26.89);Muscle weakness (generalized) (M62.81);History of falling (Z91.81)    Time: 1165-7903 PT Time Calculation (min) (ACUTE ONLY): 22 min   Charges:   PT Evaluation $PT Eval Low Complexity: 1 Low         Demyan Fugate, PT 05/03/22, 5:26 PM

## 2022-05-03 NOTE — Inpatient Diabetes Management (Signed)
Inpatient Diabetes Program Recommendations  AACE/ADA: New Consensus Statement on Inpatient Glycemic Control   Target Ranges:  Prepandial:   less than 140 mg/dL      Peak postprandial:   less than 180 mg/dL (1-2 hours)      Critically ill patients:  140 - 180 mg/dL    Latest Reference Range & Units 05/02/22 07:32 05/02/22 11:09 05/02/22 16:03 05/02/22 21:12 05/03/22 07:45  Glucose-Capillary 70 - 99 mg/dL 185 (H) 179 (H) 212 (H) 228 (H) 198 (H)   Review of Glycemic Control  Diabetes history: DM2 Outpatient Diabetes medications: Metformin 1000 mg daily Current orders for Inpatient glycemic control: Novolog 0-6 units TID with meals, Novolog 0-5 units QHS; Prednisone 40 mg QAM  Inpatient Diabetes Program Recommendations:    Insulin: May want to consider increasing Novolog correction to 0-9 units TID with meals.  Thanks, Barnie Alderman, RN, MSN, [place] Diabetes Coordinator Inpatient Diabetes Program [phone] (Team Pager from 8am to [place])

## 2022-05-04 ENCOUNTER — Other Ambulatory Visit (HOSPITAL_COMMUNITY): Payer: Self-pay

## 2022-05-04 ENCOUNTER — Encounter: Payer: Self-pay | Admitting: Family Medicine

## 2022-05-04 DIAGNOSIS — A419 Sepsis, unspecified organism: Secondary | ICD-10-CM | POA: Diagnosis not present

## 2022-05-04 DIAGNOSIS — I2699 Other pulmonary embolism without acute cor pulmonale: Secondary | ICD-10-CM | POA: Diagnosis not present

## 2022-05-04 DIAGNOSIS — I5033 Acute on chronic diastolic (congestive) heart failure: Secondary | ICD-10-CM | POA: Diagnosis not present

## 2022-05-04 DIAGNOSIS — J9601 Acute respiratory failure with hypoxia: Secondary | ICD-10-CM | POA: Diagnosis not present

## 2022-05-04 LAB — CBC
HCT: 25.5 % — ABNORMAL LOW (ref 39.0–52.0)
Hemoglobin: 8.7 g/dL — ABNORMAL LOW (ref 13.0–17.0)
MCH: 28.5 pg (ref 26.0–34.0)
MCHC: 34.1 g/dL (ref 30.0–36.0)
MCV: 83.6 fL (ref 80.0–100.0)
Platelets: 276 10*3/uL (ref 150–400)
RBC: 3.05 MIL/uL — ABNORMAL LOW (ref 4.22–5.81)
RDW: 14.9 % (ref 11.5–15.5)
WBC: 9.2 10*3/uL (ref 4.0–10.5)
nRBC: 0 % (ref 0.0–0.2)

## 2022-05-04 LAB — BASIC METABOLIC PANEL
Anion gap: 9 (ref 5–15)
BUN: 93 mg/dL — ABNORMAL HIGH (ref 8–23)
CO2: 19 mmol/L — ABNORMAL LOW (ref 22–32)
Calcium: 8.3 mg/dL — ABNORMAL LOW (ref 8.9–10.3)
Chloride: 103 mmol/L (ref 98–111)
Creatinine, Ser: 2.8 mg/dL — ABNORMAL HIGH (ref 0.61–1.24)
GFR, Estimated: 21 mL/min — ABNORMAL LOW (ref >60)
Glucose, Bld: 200 mg/dL — ABNORMAL HIGH (ref 70–99)
Potassium: 4.6 mmol/L (ref 3.5–5.1)
Sodium: 131 mmol/L — ABNORMAL LOW (ref 135–145)

## 2022-05-04 LAB — GLUCOSE, CAPILLARY
Glucose-Capillary: 192 mg/dL — ABNORMAL HIGH (ref 70–99)
Glucose-Capillary: 202 mg/dL — ABNORMAL HIGH (ref 70–99)
Glucose-Capillary: 321 mg/dL — ABNORMAL HIGH (ref 70–99)
Glucose-Capillary: 189 mg/dL — ABNORMAL HIGH (ref 70–99)

## 2022-05-04 LAB — LIPID PANEL
Cholesterol: 160 mg/dL (ref 0–200)
HDL: 37 mg/dL — ABNORMAL LOW (ref >40)
LDL Cholesterol: 103 mg/dL — ABNORMAL HIGH (ref 0–99)
Total CHOL/HDL Ratio: 4.3 RATIO
Triglycerides: 99 mg/dL (ref <150)
VLDL: 20 mg/dL (ref 0–40)

## 2022-05-04 LAB — EXPECTORATED SPUTUM ASSESSMENT W GRAM STAIN, RFLX TO RESP C

## 2022-05-04 LAB — MAGNESIUM: Magnesium: 2.1 mg/dL (ref 1.7–2.4)

## 2022-05-04 LAB — HEPARIN LEVEL (UNFRACTIONATED): Heparin Unfractionated: 0.35 IU/mL (ref 0.30–0.70)

## 2022-05-04 MED ORDER — APIXABAN 5 MG PO TABS: 5.0000 mg | ORAL_TABLET | Freq: Two times a day (BID) | ORAL | Status: DC

## 2022-05-04 MED ORDER — METOPROLOL TARTRATE 25 MG/10 ML ORAL SUSPENSION
25.0000 mg | Freq: Two times a day (BID) | ORAL | Status: DC
  Administered 2022-05-04 – 2022-05-06 (×4): 25 mg via ORAL
  Filled 2022-05-04 (×6): qty 10

## 2022-05-04 MED ORDER — ASPIRIN 81 MG PO CHEW
81.0000 mg | CHEWABLE_TABLET | Freq: Every day | ORAL | Status: DC
  Administered 2022-05-04 – 2022-05-06 (×3): 81 mg via ORAL
  Filled 2022-05-04 (×3): qty 1

## 2022-05-04 MED ORDER — ATORVASTATIN CALCIUM 20 MG PO TABS
40.0000 mg | ORAL_TABLET | Freq: Every day | ORAL | Status: DC
  Administered 2022-05-04 – 2022-05-06 (×3): 40 mg via ORAL
  Filled 2022-05-04 (×3): qty 2

## 2022-05-04 MED ORDER — APIXABAN 5 MG PO TABS
10.0000 mg | ORAL_TABLET | Freq: Two times a day (BID) | ORAL | Status: DC
  Administered 2022-05-04 – 2022-05-06 (×4): 10 mg via ORAL
  Filled 2022-05-04 (×4): qty 2

## 2022-05-04 MED ORDER — FUROSEMIDE 10 MG/ML IJ SOLN
40.0000 mg | Freq: Once | INTRAMUSCULAR | Status: AC
  Administered 2022-05-04: 40 mg via INTRAVENOUS
  Filled 2022-05-04: qty 4

## 2022-05-04 NOTE — TOC Progression Note (Signed)
Transition of Care [facility]) - Progression Note    Patient Details  Name: Dennis Serrano MRN: [id number] Date of Birth: 1932/07/12  Transition [facility]) CM/SW [place], RN Phone Number: 05/04/2022, 1:24 PM  Clinical Narrative:    Spoke with the patient's daughter [name] his HCPOA She provided permission to do a bed search for STR SNF, I explained to her that once we get a bed list we will review and that she can make a bed choice I explained that he will then need Ins approval She stated understanding   Expected Discharge Plan: Skilled Nursing Facility Barriers to Discharge: Continued Medical Work up  Expected Discharge Plan and Services Expected Discharge Plan: [place]                                               Social Determinants of Health (SDOH) Interventions    Readmission Risk Interventions     No data to display

## 2022-05-04 NOTE — Progress Notes (Signed)
ANTICOAGULATION CONSULT NOTE  Pharmacy Consult for heparin infusion Indication: ACS/STEMI/ PE  No Known Allergies  Patient Measurements: Height: '5\' 9"'$  (175.3 cm) Weight: 69.9 kg (154 lb 1.6 oz) IBW/kg (Calculated) : 70.7 Heparin Dosing Weight: 69.8 kg  Vital Signs: Temp: 97.1 F (36.2 C) (11/08 0900) Temp Source: Axillary (11/08 0900) BP: 139/66 (11/08 0900) Pulse Rate: 80 (11/08 0900)  Labs: Recent Labs    05/02/22 0228 05/02/22 0624 05/02/22 0628 05/02/22 0628 05/03/22 0441 05/03/22 1620 05/04/22 0538  HGB  --   --  9.2*   < > 8.7*  --  8.7*  HCT  --   --  27.8*  --  25.9*  --  25.5*  PLT  --   --  262  --  268  --  276  HEPARINUNFRC 0.64  --   --   --  0.32  --  0.35  CREATININE 2.24*  --   --   --  2.59* 2.88* 2.80*  TROPONINIHS  --  10,765*  --   --   --   --   --    < > = values in this interval not displayed.     Estimated Creatinine Clearance: 18.4 mL/min (A) (by C-G formula based on SCr of 2.8 mg/dL (H)).   Medical History: Past Medical History:  Diagnosis Date   Anxiety    CAD (coronary artery disease)    Dementia ([place])    Diabetes mellitus without complication (HCC)    GERD (gastroesophageal reflux disease)    History of heart artery stent    HLD (hyperlipidemia)    HTN (hypertension)    Parkinson disease    Prostate cancer ([place])    over 5 years ago   Squamous cell carcinoma of skin 06/08/2021   L vertex, EDC    Assessment: Pt is a 88 yo male presenting to ED w/ respiratory distress found with elevated troponin I and diagnoses w/ nSTEMI.   On 11/6 pulmonary perfusion "Concern for RIGHT upper lobe pulmonary embolism. Intermediate probability. Limited exam due to arms at side." Planning for treatment dose anticoagulation for PE.  Goal of Therapy:  Heparin level 0.3-0.7 units/ml Monitor platelets by anticoagulation protocol: Yes   Plan: Transitioning IV heparin to PO Eliquis for pulmonary embolism Eliquis 10 mg BID x 7 days followed by  5 mg BID thereafter First Eliquis dose is ordered for 11/8 '@1800'$ . Heparin end time placed for 11/8 '@1800'$ . Give Eliquis 10 mg at time of heparin gtt discontinuation.    Glean Salvo, PharmD Clinical Pharmacist  05/04/2022 3:19 PM

## 2022-05-04 NOTE — Progress Notes (Addendum)
Progress Note    Kia Stavros  BDZ:[id number] DOB: 10-09-1932  DOA: 05/02/2022 PCP: Juline Patch, MD      Brief Narrative:    Medical records reviewed and are as summarized below:   Jaz Mallick is a 88 y.o. male with medical history significant for CAD, Alzheimer's disease, hypertension, hyperlipidemia, Parkinson's, persistent A-fib, type 2 diabetes, COPD, who presented to the ED around 11 pm on 05/06/2022 for evaluation of worsening shortness of breath.    Patient was admitted for acute respiratory failure with hypoxia due to multifocal pneumonia.  He was started on IV antibiotics for working diagnosis of pneumonia, and IV heparin for possible ACS, with hs-troponin elevated (peaked above 11k) in the absence of chest pain.  Cardiology consulted.   11/5: on 12 L/min HFNC oxygen, otherwise stable, no CP  11/6: O2 needs improving, weaned 6 >> 4 L/min HFNC O2 this morning.  Delirium noted overnight.    11/7: remains on 5 L/min O2.  Stable for transfer to floor.        Assessment/Plan:   Principal Problem:   Acute hypoxemic respiratory failure (HCC) Active Problems:   Severe sepsis (HCC)   CAP (community acquired pneumonia)   CAD (coronary artery disease)   COPD with acute exacerbation (HCC)   NSTEMI (non-ST elevated myocardial infarction) ([place])   AKI (acute kidney injury) ([place])   Known medical problems   Goals of care, counseling/discussion   Delirium due to multiple etiologies, acute, hyperactive   Acute pulmonary embolism (HCC)   Hyponatremia   Acute on chronic diastolic CHF (congestive heart failure) ([place])    Body mass index is 22.76 kg/m.   Severe sepsis community-acquired multifocal pneumonia: Continue Augmentin.  No growth on blood cultures.  Acute on chronic diastolic CHF: Continue IV Lasix.  Monitor BMP, daily weight and urine output   Probable acute pulmonary embolism: Discontinue IV heparin drip and start Eliquis.  Case was discussed  with [name], daughter, who is a Marine scientist.  She says she understands the risk and benefits of long-term anticoagulation.  No evidence of DVT on venous duplex of the lower extremities.  Acute hypoxic respiratory failure: He is requiring 5 L/min oxygen via nasal:.  Taper off oxygen as able.  He was not on any oxygen at home according to his daughter.  Delirium with underlying dementia: continue delirium precaution and supportive care  CAD with coronary stent, elevated troponins, demand ischemia: Continue aspirin.  Follow-up with cardiologist.  COPD with acute exacerbation: Continue prednisone and bronchodilators.  AKI on CKD stage III: Creatinine was 1.39 on 11/02/2021.  Monitor BMP.  Other comorbidities include hypertension, Parkinson's disease, hyperlipidemia    Diet Order             DIET - DYS 1 Room service appropriate? Yes with Assist; Fluid consistency: Thin  Diet effective now                            Consultants: Cardiologist  Procedures: None    Medications:    amLODipine  5 mg Oral Daily   amoxicillin-clavulanate  1 tablet Oral BID   aspirin  81 mg Oral Daily   carbidopa-levodopa  1 tablet Oral TID   Chlorhexidine Gluconate Cloth  6 each Topical Daily   feeding supplement (NEPRO CARB STEADY)  237 mL Oral TID BM   haloperidol lactate  2 mg Intravenous Once   hydrALAZINE  25 mg Oral TID  insulin aspart  0-5 Units Subcutaneous QHS   insulin aspart  0-6 Units Subcutaneous TID WC   ipratropium-albuterol  3 mL Nebulization TID   metoprolol succinate  50 mg Oral Daily   multivitamin with minerals  1 tablet Oral Daily   predniSONE  40 mg Oral Q breakfast   tamsulosin  0.4 mg Oral Daily   Continuous Infusions:  heparin 1,100 Units/hr (05/03/22 2147)     Anti-infectives (From admission, onward)    Start     Dose/Rate Route Frequency Ordered Stop   05/03/22 2200  amoxicillin-clavulanate (AUGMENTIN) 500-125 MG per tablet 1 tablet        1 tablet Oral 2  times daily 05/03/22 1231 05/05/22 0959   05/02/22 2200  azithromycin (ZITHROMAX) tablet 500 mg  Status:  Discontinued        500 mg Oral Daily at bedtime 05/02/22 0936 05/03/22 1230   05/01/22 0015  cefTRIAXone (ROCEPHIN) 2 g in sodium chloride 0.9 % 100 mL IVPB  Status:  Discontinued        2 g 200 mL/hr over 30 Minutes Intravenous Every 24 hours 05/01/22 0003 05/03/22 1230   05/01/22 0015  azithromycin (ZITHROMAX) 500 mg in sodium chloride 0.9 % 250 mL IVPB  Status:  Discontinued        500 mg 250 mL/hr over 60 Minutes Intravenous Every 24 hours 05/01/22 0003 05/02/22 0935              Family Communication/Anticipated D/C date and plan/Code Status   DVT prophylaxis:      Code Status: DNR  Family Communication: Plan discussed with Lucretia Field, daughter, over the phone Disposition Plan: Plan to discharge to SNF   Status is: Inpatient Remains inpatient appropriate because: Delirium, hypoxia       Subjective:   Interval events.  He is confused and cannot provide any history.  There is a sitter at the bedside.  Objective:    Vitals:   05/04/22 0350 05/04/22 0500 05/04/22 0819 05/04/22 0900  BP: (!) 134/56   139/66  Pulse: 80   80  Resp: (!) 23   16  Temp: 97.9 F (36.6 C)   (!) 97.1 F (36.2 C)  TempSrc: Axillary   Axillary  SpO2: 90%  90% 94%  Weight:  69.9 kg    Height:       No data found.   Intake/Output Summary (Last 24 hours) at 05/04/2022 1144 Last data filed at 05/04/2022 1038 Gross per 24 hour  Intake 88.02 ml  Output 775 ml  Net -686.98 ml   Filed Weights   05/01/22 0051 05/01/22 1209 05/04/22 0500  Weight: 69.8 kg 68.9 kg 69.9 kg    Exam:  GEN: NAD SKIN: Warm and dry EYES: EOMI ENT: MMM CV: RRR PULM: Bibasilar rales. No wheezing heard ABD: soft, ND, NT, +BS CNS: Alert to person only, confused EXT: No edema or tenderness PSYCH: Restless and confused. Soft wrist restraints on b/l hands        Data Reviewed:   I have  personally reviewed following labs and imaging studies:  Labs: Labs show the following:   Basic Metabolic Panel: Recent Labs  Lab 04/27/2022 2326 05/01/22 0223 05/02/22 0228 05/03/22 0441 05/03/22 1620 05/04/22 0538  NA 134* 135 133* 132* 131* 131*  K 5.3* 4.8 4.9 4.5 5.0 4.6  CL 101 103 104 103 103 103  CO2 20* 19* 17* 17* 17* 19*  GLUCOSE 223* 218* 199* 206* 253* 200*  BUN  42* 45* 58* 80* 89* 93*  CREATININE 2.20* 2.16* 2.24* 2.59* 2.88* 2.80*  CALCIUM 9.0 8.7* 8.5* 8.4* 8.7* 8.3*  MG 1.7  --  1.9 2.1  --  2.1  PHOS  --   --   --  4.3  --   --    GFR Estimated Creatinine Clearance: 18.4 mL/min (A) (by C-G formula based on SCr of 2.8 mg/dL (H)). Liver Function Tests: Recent Labs  Lab 05/08/2022 2326 05/01/22 0223  AST 91* 82*  ALT <5 8  ALKPHOS 117 106  BILITOT 0.4 0.4  PROT 7.5 7.0  ALBUMIN 3.8 3.3*   No results for input(s): "LIPASE", "AMYLASE" in the last 168 hours. No results for input(s): "AMMONIA" in the last 168 hours. Coagulation profile No results for input(s): "INR", "PROTIME" in the last 168 hours.  CBC: Recent Labs  Lab 05/14/2022 2326 05/01/22 0223 05/02/22 0628 05/03/22 0441 05/04/22 0538  WBC 13.6* 11.3* 13.0* 10.1 9.2  NEUTROABS 12.1*  --   --   --   --   HGB 11.1* 10.4* 9.2* 8.7* 8.7*  HCT 34.7* 32.5* 27.8* 25.9* 25.5*  MCV 87.6 87.4 85.0 83.5 83.6  PLT 263 285 262 268 276   Cardiac Enzymes: No results for input(s): "CKTOTAL", "CKMB", "CKMBINDEX", "TROPONINI" in the last 168 hours. BNP (last 3 results) No results for input(s): "PROBNP" in the last 8760 hours. CBG: Recent Labs  Lab 05/03/22 1121 05/03/22 1730 05/03/22 2112 05/04/22 0803 05/04/22 1125  GLUCAP 256* 258* 174* 192* 202*   D-Dimer: No results for input(s): "DDIMER" in the last 72 hours. Hgb A1c: No results for input(s): "HGBA1C" in the last 72 hours. Lipid Profile: Recent Labs    05/04/22 0538  CHOL 160  HDL 37*  LDLCALC 103*  TRIG 99  CHOLHDL 4.3   Thyroid  function studies: No results for input(s): "TSH", "T4TOTAL", "T3FREE", "THYROIDAB" in the last 72 hours.  Invalid input(s): "FREET3" Anemia work up: No results for input(s): "VITAMINB12", "FOLATE", "FERRITIN", "TIBC", "IRON", "RETICCTPCT" in the last 72 hours. Sepsis Labs: Recent Labs  Lab 05/05/2022 2326 05/01/22 0223 05/01/22 1437 05/02/22 0628 05/03/22 0441 05/03/22 1620 05/04/22 0538  PROCALCITON 0.38  --   --   --   --  0.16  --   WBC 13.6* 11.3*  --  13.0* 10.1  --  9.2  LATICACIDVEN 3.6*  --  1.8  --   --   --   --     Microbiology Recent Results (from the past 240 hour(s))  Culture, blood (routine x 2)     Status: None (Preliminary result)   Collection Time: 04/29/2022 11:26 PM   Specimen: BLOOD  Result Value Ref Range Status   Specimen Description BLOOD BLOOD LEFT HAND  Final   Special Requests   Final    BOTTLES DRAWN AEROBIC AND ANAEROBIC Blood Culture adequate volume   Culture   Final    NO GROWTH 4 DAYS Performed at [facility], [address] [place], [place] [address]    Report Status PENDING  Incomplete  Culture, blood (routine x 2)     Status: None (Preliminary result)   Collection Time: 05/03/2022 11:26 PM   Specimen: BLOOD  Result Value Ref Range Status   Specimen Description BLOOD BLOOD RIGHT ARM  Final   Special Requests   Final    BOTTLES DRAWN AEROBIC AND ANAEROBIC Blood Culture adequate volume   Culture   Final    NO GROWTH 4 DAYS Performed  at [facility], [address] [place], [place] [address]    Report Status PENDING  Incomplete  Resp Panel by RT-PCR (Flu A&B, Covid) Anterior Nasal Swab     Status: None   Collection Time: 05/23/2022 11:26 PM   Specimen: Anterior Nasal Swab  Result Value Ref Range Status   SARS Coronavirus 2 by RT PCR NEGATIVE NEGATIVE Final    Comment: (NOTE) SARS-CoV-2 target nucleic acids are NOT DETECTED.  The SARS-CoV-2 RNA is generally detectable in upper respiratory specimens during the acute  phase of infection. The lowest concentration of SARS-CoV-2 viral copies this assay can detect is 138 copies/mL. A negative result does not preclude SARS-Cov-2 infection and should not be used as the sole basis for treatment or other patient management decisions. A negative result may occur with  improper specimen collection/handling, submission of specimen other than nasopharyngeal swab, presence of viral mutation(s) within the areas targeted by this assay, and inadequate number of viral copies(<138 copies/mL). A negative result must be combined with clinical observations, patient history, and epidemiological information. The expected result is Negative.  Fact Sheet for Patients:  EntrepreneurPulse.com.au  Fact Sheet for Healthcare Providers:  IncredibleEmployment.be  This test is no t yet approved or cleared by the [place] FDA and  has been authorized for detection and/or diagnosis of SARS-CoV-2 by FDA under an Emergency Use Authorization (EUA). This EUA will remain  in effect (meaning this test can be used) for the duration of the COVID-19 declaration under Section 564(b)(1) of the Act, 21 U.S.C.section 360bbb-3(b)(1), unless the authorization is terminated  or revoked sooner.       Influenza A by PCR NEGATIVE NEGATIVE Final   Influenza B by PCR NEGATIVE NEGATIVE Final    Comment: (NOTE) The Xpert Xpress SARS-CoV-2/FLU/RSV plus assay is intended as an aid in the diagnosis of influenza from Nasopharyngeal swab specimens and should not be used as a sole basis for treatment. Nasal washings and aspirates are unacceptable for Xpert Xpress SARS-CoV-2/FLU/RSV testing.  Fact Sheet for Patients: EntrepreneurPulse.com.au  Fact Sheet for Healthcare Providers: IncredibleEmployment.be  This test is not yet approved or cleared by the [place] FDA and has been authorized for detection and/or diagnosis of  SARS-CoV-2 by FDA under an Emergency Use Authorization (EUA). This EUA will remain in effect (meaning this test can be used) for the duration of the COVID-19 declaration under Section 564(b)(1) of the Act, 21 U.S.C. section 360bbb-3(b)(1), unless the authorization is terminated or revoked.  Performed at [facility], [address] [place], [place] [address]   MRSA Next Gen by PCR, Nasal     Status: None   Collection Time: 05/01/22 12:07 PM   Specimen: Nasal Mucosa; Nasal Swab  Result Value Ref Range Status   MRSA by PCR Next Gen NOT DETECTED NOT DETECTED Final    Comment: (NOTE) The GeneXpert MRSA Assay (FDA approved for NASAL specimens only), is one component of a comprehensive MRSA colonization surveillance program. It is not intended to diagnose MRSA infection nor to guide or monitor treatment for MRSA infections. Test performance is not FDA approved in patients less than 22 years old. Performed at [facility], [place], [place] [address]   Respiratory (~20 pathogens) panel by PCR     Status: Abnormal   Collection Time: 05/02/22  1:53 AM   Specimen: Nasopharyngeal Swab; Respiratory  Result Value Ref Range Status   Adenovirus NOT DETECTED NOT DETECTED Final   Coronavirus 229E NOT DETECTED  NOT DETECTED Final    Comment: (NOTE) The Coronavirus on the Respiratory Panel, DOES NOT test for the novel  Coronavirus (2019 nCoV)    Coronavirus HKU1 NOT DETECTED NOT DETECTED Final   Coronavirus NL63 NOT DETECTED NOT DETECTED Final   Coronavirus OC43 NOT DETECTED NOT DETECTED Final   Metapneumovirus NOT DETECTED NOT DETECTED Final   Rhinovirus / Enterovirus DETECTED (A) NOT DETECTED Final   Influenza A NOT DETECTED NOT DETECTED Final   Influenza B NOT DETECTED NOT DETECTED Final   Parainfluenza Virus 1 NOT DETECTED NOT DETECTED Final   Parainfluenza Virus 2 NOT DETECTED NOT DETECTED Final   Parainfluenza Virus 3 NOT DETECTED NOT DETECTED Final    Parainfluenza Virus 4 NOT DETECTED NOT DETECTED Final   Respiratory Syncytial Virus NOT DETECTED NOT DETECTED Final   Bordetella pertussis NOT DETECTED NOT DETECTED Final   Bordetella Parapertussis NOT DETECTED NOT DETECTED Final   Chlamydophila pneumoniae NOT DETECTED NOT DETECTED Final   Mycoplasma pneumoniae NOT DETECTED NOT DETECTED Final    Comment: Performed at [facility], [place] [address] [place], [place] [address]  Expectorated Sputum Assessment w Gram Stain, Rflx to Resp Cult     Status: None   Collection Time: 05/03/22  9:45 PM   Specimen: Sputum  Result Value Ref Range Status   Specimen Description SPUTUM  Final   Special Requests NONE  Final   Sputum evaluation   Final    THIS SPECIMEN IS ACCEPTABLE FOR SPUTUM CULTURE Performed at [facility], [address], [place], [place] [address]    Report Status 05/04/2022 FINAL  Final  Culture, Respiratory w Gram Stain     Status: None (Preliminary result)   Collection Time: 05/03/22  9:45 PM   Specimen: SPU  Result Value Ref Range Status   Specimen Description   Final    SPUTUM Performed at [facility], [address], [place], [place] [address]    Special Requests   Final    NONE Reflexed from [id number] Performed at [facility], [address] [place], [place] [address]    Gram Stain   Final    RARE WBC PRESENT, PREDOMINANTLY PMN FEW BUDDING YEAST SEEN Performed at [facility], [place] [address], [place], [place] [address]    Culture PENDING  Incomplete   Report Status PENDING  Incomplete    Procedures and diagnostic studies:  US Venous Img Lower Bilateral (DVT)  Result Date: 05/04/2022 CLINICAL DATA:  History of pulmonary embolism, on anticoagulation. Evaluate for DVT. EXAM: BILATERAL LOWER EXTREMITY VENOUS DOPPLER ULTRASOUND TECHNIQUE: Gray-scale sonography with graded compression, as well as color Doppler and duplex ultrasound were performed to evaluate the lower extremity  deep venous systems from the level of the common femoral vein and including the common femoral, femoral, profunda femoral, popliteal and calf veins including the posterior tibial, peroneal and gastrocnemius veins when visible. The superficial great saphenous vein was also interrogated. Spectral Doppler was utilized to evaluate flow at rest and with distal augmentation maneuvers in the common femoral, femoral and popliteal veins. COMPARISON:  Nuclear medicine perfusion lung scan-05/04/2022. FINDINGS: RIGHT LOWER EXTREMITY Common Femoral Vein: No evidence of thrombus. Normal compressibility, respiratory phasicity and response to augmentation. Saphenofemoral Junction: No evidence of thrombus. Normal compressibility and flow on color Doppler imaging. Profunda Femoral Vein: No evidence of thrombus. Normal compressibility and flow on color Doppler imaging. Femoral Vein: No evidence of thrombus. Normal compressibility, respiratory phasicity and response to augmentation. Popliteal  Vein: No evidence of thrombus. Normal compressibility, respiratory phasicity and response to augmentation. Calf Veins: No evidence of thrombus. Normal compressibility and flow on color Doppler imaging. Superficial Great Saphenous Vein: No evidence of thrombus. Normal compressibility. Other Findings:  None. LEFT LOWER EXTREMITY Common Femoral Vein: No evidence of thrombus. Normal compressibility, respiratory phasicity and response to augmentation. Saphenofemoral Junction: No evidence of thrombus. Normal compressibility and flow on color Doppler imaging. Profunda Femoral Vein: No evidence of thrombus. Normal compressibility and flow on color Doppler imaging. Femoral Vein: No evidence of thrombus. Normal compressibility, respiratory phasicity and response to augmentation. Popliteal Vein: No evidence of thrombus. Normal compressibility, respiratory phasicity and response to augmentation. Calf Veins: No evidence of thrombus. Normal compressibility and  flow on color Doppler imaging. Superficial Great Saphenous Vein: No evidence of thrombus. Normal compressibility. Other Findings:  None. IMPRESSION: No evidence of DVT within either lower extremity. Electronically Signed   By: Sandi Mariscal M.D.   On: 05/04/2022 08:11   NM Pulmonary Perfusion  Result Date: 05/02/2022 CLINICAL DATA:  Short of breath. EXAM: NUCLEAR MEDICINE PERFUSION LUNG SCAN TECHNIQUE: Perfusion images were obtained in multiple projections after intravenous injection of radiopharmaceutical. RADIOPHARMACEUTICALS:  4.26 mCi Tc-29mMAA COMPARISON:  Chest radiograph 05/09/2022 FINDINGS: Exam is limited by patient's arms at side. Patient was unable to elevate arms for exam. Decreased region perfusion to the RIGHT lung apex on multiple projections. Perfusion defect seen on the RPO projection is favored related to arm attenuation. No wedge-shaped peripheral perfusion defect.s Chest radiograph demonstrates bibasilar airspace opacity and RIGHT effusion. RIGHT upper lobe relatively clear. IMPRESSION: Concern for RIGHT upper lobe pulmonary embolism. Intermediate probability. Limited exam due to arms at side. Electronically Signed   By: SSuzy BouchardM.D.   On: 05/02/2022 16:24   DG Chest Port 1 View  Result Date: 05/02/2022 CLINICAL DATA:  Acute respiratory failure, hypoxia EXAM: PORTABLE CHEST 1 VIEW COMPARISON:  Previous studies including the examination done on 05/15/2022 FINDINGS: Transverse diameter of heart is increased. There is interval worsening of pulmonary vascular congestion. Increased interstitial and alveolar markings are seen in parahilar regions and lower lung fields. Small to moderate bilateral pleural effusions are seen with interval increase. Evaluation of lower lung fields for focal infiltrates is limited by the bilateral effusions. There is no pneumothorax. IMPRESSION: There is interval worsening of pulmonary vascular congestion suggesting worsening CHF. There is interval increase  in opacification of both parahilar regions and lower lung fields suggesting increase in bilateral pleural effusions and worsening pulmonary edema. Electronically Signed   By: PElmer PickerM.D.   On: 05/02/2022 15:35               LOS: 3 days   Roshunda Keir  [facility]   Pager on [email or web address] If 7PM-7AM, please contact night-coverage at [email or web address]     05/04/2022, 11:44 AM

## 2022-05-04 NOTE — TOC Benefit Eligibility Note (Signed)
Patient Teacher, English as a foreign language completed.    The patient is currently admitted and upon discharge could be taking Eliquis Starter Pack.  The current 30 day co-pay is $45.00.   The patient is insured through [place], [place] Patient Advocate Specialist [place] Patient Advocate Team Direct Number: [phone]  Fax: [phone]

## 2022-05-04 NOTE — Progress Notes (Addendum)
Speech Language Pathology Treatment: Dysphagia  Patient Details Name: Dennis Serrano MRN: [id number] DOB: [birth year] Today's Date: 05/04/2022 Time: 0940-1030 SLP Time Calculation (min) (ACUTE ONLY): 50 min  Assessment / Plan / Recommendation Clinical Impression  Pt seen today for ongoing assessment of toleration of diet; he moved out of CCU to a floor room yesterday pm. Sitter present stating he did not want to eat breakfast earlier. He remains on a dysphagia level 1 w/ thin liquids w/ aspiration precautions. Pt awake and participated in po trials w/ this Clinician, few bites of breakfast meal then meds w/ NSG. No family present. Pt has Baseline Advanced Dementia. Pt on [place] O2 support. Afebrile. WBC 10.1. He was able to feed himself w/ setup support -- hold cup to drink. He appeared to benefit from reducing distractions during the session.  OF NOTE: Pt exhibited a Congested cough PRIOR to any po's given w/ moving about in bed. NSG reported upper airway congestion only.  Pt maintained calmness throughout session w/ 1 mitt removed for self-feeding. Sitter aware.   Pt appears to present w/ oral phase dysphagia in setting of declined Cognitive status; Baseline advanced Dementia. ANY oral phase deficits can impact the pharyngeal phase of swallowing thus increase risk for aspiration. Cognitive decline can impact overall awareness/timing of swallow and safety during po tasks which increases risk for aspiration, choking. Pt's risk for aspiration/aspiration pneumonia can be reduced when following general aspiration precautions, given support w/ feeding at meals, and using a modified diet consistency of blended foods d/t Edentulous status and ease of oral phase/clearing.  OF NOTE: pt also has declined Respiratory status w/ congested cough -- see most recent CXR indicating "interval worsening of pulmonary vascular congestion  suggesting worsening CHF. There is interval increase in  opacification of both parahilar  regions and lower lung fields  suggesting increase in bilateral pleural effusions and worsening  pulmonary edema.". Any Pulmonary decline can impact timing of swallowing thus increase risk for aspiration to occur. NSG reported pt is on Lasix currently.    Pt consumed trials of ice chips then thin liquids via CUP(~4-5 ozs) today w/ pt Holding Cup for best engagement w/ self-drinking/feeding, then tsps of puree w/ No immediate, overt clinical s/s of aspiration noted: no decline in vocal quality; no immediate cough, and no decline in respiratory status during/post trials. Pt consumed purees w/ Crushed meds w/ NSG. A Delayed cough was noted b/t trials of thin liquids x1; no other decline in status. This cough did not appears immediately related to the swallow. Oral phase was grossly adequate for bolus management and oral clearing of the boluses given -- min increased oral phase Time in A-P transfer w/ trials x3-4(puree).    Pt attempted self-feeding and was able to Hold Cup to drink which improves safety of swallowing.        In setting of baseline Dementia/Cognitive decline, Edentulous status, and declined Pulmonary status all which increase risk for dysphagia and aspiration, recommend continue the dysphagia level 1(PUREED foods moistened for ease of oral phase) w/ thin liquids -- monitor straw use and use CUP for more controlled sips; aspiration and REFLUX precautions; reduce Distractions during meals and engage pt during meals for self-feeding. Pills Crushed in Puree for safer swallowing. Support w/ feeding at meals and give Rest Breaks to lessen any WOB w/ po tasks. This diet consistency can best support safe nutrition d/t the oral phase deficits in setting of Dementia. Education provided to [place] and Sitter in room to best  support pt during meals. Precautions posted in room.   ST services recommends follow w/ Palliative Care for Dennis Serrano and education re: impact of Cognitive decline/Dementia and Pulmonary decline  on swallowing. Suspect pt is close to/at his baseline. ST services can be available if new/further needs and to provide education as needed. Attempted TC w/ Dtr, Dennis Serrano post session; left voicemail.     HPI HPI: Pt is a 88 y.o. male with medical history significant for CAD, NSTEMI, AKI, Alzheimer's Dementia disease, tension, hyperlipidemia, Parkinson's dis, persistent A-fib, type 2 diabetes, COPD, who presents with shortness of breath.  On interview in the ED, patient reports that he is feeling short of breath and has had a cough (unable to give a timeframe) but denies any other symptoms.  BIPAP had been placed by EMS.  Currently on [place] O2 support.  CXR on 05/02/22 revealed: "There is interval worsening of pulmonary vascular congestion  suggesting worsening CHF. There is interval increase in  opacification of both parahilar regions and lower lung fields  suggesting increase in bilateral pleural effusions and worsening  pulmonary edema.".      SLP Plan  All goals met      Recommendations for follow up therapy are one component of a multi-disciplinary discharge planning process, led by the attending physician.  Recommendations may be updated based on patient status, additional functional criteria and insurance authorization.    Recommendations  Diet recommendations: Dysphagia 1 (puree);Thin liquid Liquids provided via: Cup;Straw (monitor straw use) Medication Administration: Crushed with puree Supervision: Patient able to self feed;Staff to assist with self feeding;Full supervision/cueing for compensatory strategies Compensations: Minimize environmental distractions;Slow rate;Small sips/bites;Lingual sweep for clearance of pocketing;Multiple dry swallows after each bite/sip;Follow solids with liquid Postural Changes and/or Swallow Maneuvers: Out of bed for meals;Seated upright 90 degrees;Upright 30-60 min after meal                General recommendations:  (Palliative Care for [place]; Dietician  f/u) Oral Care Recommendations: Oral care BID;Oral care before and after PO;Staff/trained caregiver to provide oral care Follow Up Recommendations: Follow physician's recommendations for discharge plan and follow up therapies Assistance recommended at discharge: Frequent or constant Supervision/Assistance (feeding support w/ foods, cup support) SLP Visit Diagnosis: Dysphagia, oral phase (R13.11) (Baseline advanced Dementia) Plan: All goals met             Orinda Kenner, [place], [place]; [place] [phone] (ascom) Tishana Clinkenbeard  05/04/2022, 1:07 PM

## 2022-05-04 NOTE — NC FL2 (Signed)
[place] LEVEL OF CARE SCREENING TOOL     IDENTIFICATION  Patient Name: Dennis Serrano Birthdate: [birth year] Sex: male Admission Date (Current Location): 05/13/2022  [facility] [place] and [place] Number:  Engineering geologist and Address:  [facility], [address] [place], [place] [address]      Provider Number: [id number]  Attending Physician Name and Address:  Jennye Boroughs, MD  Relative Name and Phone Number:  [name]  [phone]    Current Level of Care: Hospital Recommended Level of Care: [place] Prior Approval Number:    Date Approved/Denied:   PASRR Number: [id number]  Discharge Plan: SNF    Current Diagnoses: Patient Active Problem List   Diagnosis Date Noted   Acute pulmonary embolism ([place]) 05/03/2022   Hyponatremia 05/03/2022   Acute on chronic diastolic CHF (congestive heart failure) ([place]) 05/03/2022   Delirium due to multiple etiologies, acute, hyperactive 05/02/2022   Acute hypoxemic respiratory failure ([place]) 05/01/2022   Alzheimer's disease ([place]) 05/01/2022   COPD with acute exacerbation ([place]) 05/01/2022   NSTEMI (non-ST elevated myocardial infarction) ([place]) 05/01/2022   AKI (acute kidney injury) ([place]) 05/01/2022   Known medical problems 05/01/2022   Goals of care, counseling/discussion 05/01/2022   Medication overdose 09/02/2018   TIA (transient ischemic attack) 10/20/2016   Parkinson's disease without dyskinesia, without mention of fluctuations 10/11/2016   Persistent atrial fibrillation ([place]) 10/11/2016   Controlled type 2 diabetes mellitus without complication, with long-term current use of insulin ([place]) 10/11/2016   Pressure injury of skin 10/07/2016   Severe sepsis ([place]) 10/02/2016   CAP (community acquired pneumonia) 10/02/2016   HTN (hypertension) 10/02/2016   HLD (hyperlipidemia) 10/02/2016   CAD (coronary artery disease) 10/02/2016   GERD (gastroesophageal reflux disease) 10/02/2016    Malignant neoplasm of prostate ([place]) 02/15/2011    Orientation RESPIRATION BLADDER Height & Weight     Self  O2 (5L nasal cannula) Incontinent, External catheter Weight: 154 lb 1.6 oz (69.9 kg) Height:  '5\' 9"'$  (175.3 cm)  BEHAVIORAL SYMPTOMS/MOOD NEUROLOGICAL BOWEL NUTRITION STATUS      Incontinent Diet (see dc summary, dysphagia 1, clear liquid)  AMBULATORY STATUS COMMUNICATION OF NEEDS Skin   Extensive Assist Verbally Other (Comment), Skin abrasions (knee abrasion)                       Personal Care Assistance Level of Assistance  Bathing, Feeding, Dressing, Total care Bathing Assistance: Maximum assistance Feeding assistance: Limited assistance Dressing Assistance: Maximum assistance Total Care Assistance: Maximum assistance   Functional Limitations Info  Sight, Hearing, Speech Sight Info: Adequate Hearing Info: Impaired Speech Info: Adequate    SPECIAL CARE FACTORS FREQUENCY  PT (By licensed PT), OT (By licensed OT)     PT Frequency: min 4x weekly OT Frequency: min 4x weekly            Contractures Contractures Info: Not present    Additional Factors Info  Code Status, Allergies Code Status Info: DNR Allergies Info: NKA           Current Medications (05/04/2022):  This is the current hospital active medication list Current Facility-Administered Medications  Medication Dose Route Frequency Provider Last Rate Last Admin   acetaminophen (TYLENOL) tablet 650 mg  650 mg Oral Q6H PRN Clarnce Flock, MD       Or   acetaminophen (TYLENOL) suppository 650 mg  650 mg Rectal Q6H PRN Clarnce Flock, MD       amLODipine (  NORVASC) tablet 5 mg  5 mg Oral Daily Clarnce Flock, MD   5 mg at 05/04/22 1023   amoxicillin-clavulanate (AUGMENTIN) 500-125 MG per tablet 1 tablet  1 tablet Oral BID Nicole Kindred A, DO   1 tablet at 05/04/22 1023   apixaban (ELIQUIS) tablet 10 mg  10 mg Oral BID Wynelle Cleveland, RPH       Followed by   Derrill Memo ON 05/05/2022]  apixaban (ELIQUIS) tablet 5 mg  5 mg Oral BID Wynelle Cleveland, RPH       aspirin chewable tablet 81 mg  81 mg Oral Daily Wynelle Cleveland, RPH   81 mg at 05/04/22 1027   atorvastatin (LIPITOR) tablet 40 mg  40 mg Oral Daily Jennye Boroughs, MD       carbidopa-levodopa (SINEMET IR) 25-100 MG per tablet immediate release 1 tablet  1 tablet Oral TID Clarnce Flock, MD   1 tablet at 05/04/22 1022   Chlorhexidine Gluconate Cloth 2 % PADS 6 each  6 each Topical Daily Nicole Kindred A, DO   6 each at 05/04/22 1023   feeding supplement (NEPRO CARB STEADY) liquid 237 mL  237 mL Oral TID BM Nicole Kindred A, DO   237 mL at 05/04/22 1023   haloperidol lactate (HALDOL) injection 1-2 mg  1-2 mg Intramuscular Q6H PRN Mansy, Jan A, MD   2 mg at 05/02/22 2241   haloperidol lactate (HALDOL) injection 2 mg  2 mg Intravenous Once Foust, Katy L, NP       heparin ADULT infusion 100 units/mL (25000 units/271m)  1,100 Units/hr Intravenous Continuous CWynelle Cleveland RPH 11 mL/hr at 05/03/22 2147 1,100 Units/hr at 05/03/22 2147   hydrALAZINE (APRESOLINE) tablet 25 mg  25 mg Oral TID GNicole KindredA, DO   25 mg at 05/04/22 1022   insulin aspart (novoLOG) injection 0-5 Units  0-5 Units Subcutaneous QHS EClarnce Flock MD   2 Units at 05/02/22 2145   insulin aspart (novoLOG) injection 0-6 Units  0-6 Units Subcutaneous TID WC EClarnce Flock MD   2 Units at 05/04/22 1223   ipratropium-albuterol (DUONEB) 0.5-2.5 (3) MG/3ML nebulizer solution 3 mL  3 mL Nebulization TID GNicole KindredA, DO   3 mL at 05/04/22 1405   labetalol (NORMODYNE) injection 10 mg  10 mg Intravenous Q4H PRN GNicole KindredA, DO   10 mg at 05/01/22 1019   metoprolol tartrate (LOPRESSOR) 25 mg/10 mL oral suspension 25 mg  25 mg Oral BID CMickeal SkinnerA, RPH       multivitamin with minerals tablet 1 tablet  1 tablet Oral Daily GNicole KindredA, DO   1 tablet at 05/04/22 1022   ondansetron (ZOFRAN) tablet 4 mg  4 mg Oral Q6H PRN  EClarnce Flock MD       Or   ondansetron ([facility] injection 4 mg  4 mg Intravenous Q6H PRN EClarnce Flock MD       oxyCODONE (Oxy IR/ROXICODONE) immediate release tablet 5 mg  5 mg Oral Q4H PRN EClarnce Flock MD   5 mg at 05/03/22 2111   polyethylene glycol (MIRALAX / GLYCOLAX) packet 17 g  17 g Oral Daily PRN EClarnce Flock MD       predniSONE (DELTASONE) tablet 40 mg  40 mg Oral Q breakfast EClarnce Flock MD   40 mg at 05/04/22 0850   tamsulosin (FLOMAX) capsule 0.4 mg  0.4 mg Oral Daily EClarnce Flock  MD   0.4 mg at 05/03/22 0818   traZODone (DESYREL) tablet 25 mg  25 mg Oral QHS PRN Clarnce Flock, MD   25 mg at 05/03/22 2111     Discharge Medications: Please see discharge summary for a list of discharge medications.  Relevant Imaging Results:  Relevant Lab Results:   Additional Information SSN: [id number]  Tiburcio Bash, LCSW

## 2022-05-04 NOTE — Plan of Care (Signed)
Consult noted for goals of care discussion.  In to see patient, no family at bedside, sitter present at bedside.  Patient is unable to have a goals of care conversation himself.  Called daughter [name] unsuccessfully, voicemail left.  We will reattempt tomorrow.

## 2022-05-04 NOTE — Progress Notes (Signed)
[place] NOTE       Patient ID: Dennis Serrano MRN: [id number] DOB/AGE: 07-26-1932 88 y.o.  Admit date: 04/28/2022 Referring Physician Dr. Clayton Lefort Primary Physician Dr. Izell [facility]) Primary Cardiologist [facility] Reason for Consultation NSTEMI  HPI: Dennis Serrano is an 88yoM with a PMH of CAD with remote h/o stents, ?paroxysmal AF not on AC, dementia, DM2, Parkinson's, Prostate cancer s/p XRT (2012), HTN, HLD who presented to [facility] ED 05/26/2022 with shortness of breath with a cough, he required BiPAP in the ED noted leukocytosis to 13,000 and lactic acidosis on admission.  His initial troponin was elevated to 9500 with peak of 11,500, echocardiogram showed preserved LVEF without significant WMA's, G1 DD and moderate MR.  He is being treated for community-acquired pneumonia and treated conservatively for NSTEMI.  Respiratory panel was positive for rhinovirus  Interval history: -laying in bed on 1C, Serrano in room. When asked if he has chest pain or shortness of breath he said "no, do you?" -renal function with slight improvement after lasix  -no acute events.   Review of systems complete and found to be negative unless listed above     Past Medical History:  Diagnosis Date   Anxiety    CAD (coronary artery disease)    Dementia ([place])    Diabetes mellitus without complication ([place])    GERD (gastroesophageal reflux disease)    History of heart artery stent    HLD (hyperlipidemia)    HTN (hypertension)    Parkinson disease    Prostate cancer ([place])    over 5 years ago   Squamous cell carcinoma of skin 06/08/2021   L vertex, EDC    Past Surgical History:  Procedure Laterality Date   APPENDECTOMY     CORONARY STENT PLACEMENT     EYE SURGERY     TRANSURETHRAL RESECTION OF PROSTATE      Medications Prior to Admission  Medication Sig Dispense Refill Last Dose   amLODipine (NORVASC) 5 MG tablet Take 5 mg by mouth daily.   05/13/2022   aspirin 325 MG  tablet Take 1 tablet (325 mg total) by mouth daily. (Patient taking differently: Take 81 mg by mouth daily.) 30 tablet 0 05/19/2022   carbidopa-levodopa (SINEMET IR) 25-100 MG tablet Take 1 tablet by mouth 3 (three) times daily.   05/08/2022   dilTIAZem HCl ER 420 MG TB24 Take 1 tablet by mouth daily.   05/09/2022   hydrALAZINE (APRESOLINE) 25 MG tablet Take 25 mg by mouth 3 (three) times daily.   05/25/2022   lisinopril (PRINIVIL,ZESTRIL) 40 MG tablet Take 40 mg by mouth daily.   05/26/2022   metFORMIN (GLUCOPHAGE) 1000 MG tablet Take 1,000 mg by mouth daily with breakfast.   05/17/2022   spironolactone (ALDACTONE) 25 MG tablet Take 25 mg by mouth daily.   05/23/2022   tamsulosin (FLOMAX) 0.4 MG CAPS capsule Take 0.4 mg by mouth daily.   05/21/2022   acetaminophen (TYLENOL) 500 MG tablet Take 500 mg by mouth every 8 (eight) hours.   prn at prn   celecoxib (CELEBREX) 200 MG capsule Take 200 mg by mouth daily. (Patient not taking: Reported on 05/01/2022)   Not Taking   cloNIDine (CATAPRES) 0.1 MG tablet Take 0.1 mg by mouth daily. (Patient not taking: Reported on 05/01/2022)   Not Taking   gemfibrozil (LOPID) 600 MG tablet  (Patient not taking: Reported on 05/01/2022)   Not Taking   mupirocin ointment (BACTROBAN) 2 % Apply once to twice  a day to affected area scalp until healed. (Patient not taking: Reported on 05/01/2022) 22 g 0 Not Taking   ondansetron (ZOFRAN ODT) 4 MG disintegrating tablet Take 1 tablet (4 mg total) by mouth every 8 (eight) hours as needed for nausea or vomiting. (Patient not taking: Reported on 05/01/2022) 20 tablet 0 Not Taking   Social History   Socioeconomic History   Marital status: Widowed    Spouse name: Not on file   Number of children: Not on file   Years of education: Not on file   Highest education level: Not on file  Occupational History   Not on file  Tobacco Use   Smoking status: Former    Types: Cigarettes    Quit date: 81    Years since quitting: 43.8    Smokeless tobacco: Never  Substance and Sexual Activity   Alcohol use: No   Drug use: No   Sexual activity: Not on file  Other Topics Concern   Not on file  Social History Narrative   Not on file   Social Determinants of Health   Financial Resource Strain: Not on file  Food Insecurity: Not on file  Transportation Needs: Not on file  Physical Activity: Not on file  Stress: Not on file  Social Connections: Not on file  Intimate Partner Violence: Not on file    Family History  Problem Relation Age of Onset   Heart disease Mother    Heart disease Father    Cancer Sister    Cancer Brother      Vitals:   05/04/22 0350 05/04/22 0500 05/04/22 0819 05/04/22 0900  BP: (!) 134/56   139/66  Pulse: 80   80  Resp: (!) 23   16  Temp: 97.9 F (36.6 C)   (!) 97.1 F (36.2 C)  TempSrc: Axillary   Axillary  SpO2: 90%  90% 94%  Weight:  69.9 kg    Height:        PHYSICAL EXAM General: Elderly and frail-appearing Caucasian male, in no acute distress. Laying in bed, Serrano at bedside.  HEENT:  Normocephalic and atraumatic.  Extremely hard of hearing. Neck:  No JVD.  Lungs: Normal respiratory effort on oxygen by nasal cannula.  Decreased breath sounds with out appreciable crackles or wheezes.  Occasional cough. Heart: HRRR . Normal S1 and S2 without gallops or murmurs.  Abdomen: Non-distended appearing.  Msk: Normal strength and tone for age. Extremities: Warm and well perfused. No clubbing, cyanosis.  No significant peripheral edema.  Neuro: Alert and oriented to self and year but not place or situation. Psych:  Answers simple questions  Labs: Basic Metabolic Panel: Recent Labs    05/03/22 0441 05/03/22 1620 05/04/22 0538  NA 132* 131* 131*  K 4.5 5.0 4.6  CL 103 103 103  CO2 17* 17* 19*  GLUCOSE 206* 253* 200*  BUN 80* 89* 93*  CREATININE 2.59* 2.88* 2.80*  CALCIUM 8.4* 8.7* 8.3*  MG 2.1  --  2.1  PHOS 4.3  --   --     Liver Function Tests: No results for  input(s): "AST", "ALT", "ALKPHOS", "BILITOT", "PROT", "ALBUMIN" in the last 72 hours.  No results for input(s): "LIPASE", "AMYLASE" in the last 72 hours. CBC: Recent Labs    05/03/22 0441 05/04/22 0538  WBC 10.1 9.2  HGB 8.7* 8.7*  HCT 25.9* 25.5*  MCV 83.5 83.6  PLT 268 276    Cardiac Enzymes: Recent Labs    05/02/22 0624  TROPONINIHS 10,765*    BNP: Recent Labs    05/03/22 1620  BNP 1,787.2*    D-Dimer: No results for input(s): "DDIMER" in the last 72 hours. Hemoglobin A1C: No results for input(s): "HGBA1C" in the last 72 hours. Fasting Lipid Panel: Recent Labs    05/04/22 0538  CHOL 160  HDL 37*  LDLCALC 103*  TRIG 99  CHOLHDL 4.3   Thyroid Function Tests: No results for input(s): "TSH", "T4TOTAL", "T3FREE", "THYROIDAB" in the last 72 hours.  Invalid input(s): "FREET3" Anemia Panel: No results for input(s): "VITAMINB12", "FOLATE", "FERRITIN", "TIBC", "IRON", "RETICCTPCT" in the last 72 hours.   Radiology: US Venous Img Lower Bilateral (DVT)  Result Date: 05/04/2022 CLINICAL DATA:  History of pulmonary embolism, on anticoagulation. Evaluate for DVT. EXAM: BILATERAL LOWER EXTREMITY VENOUS DOPPLER ULTRASOUND TECHNIQUE: Gray-scale sonography with graded compression, as well as color Doppler and duplex ultrasound were performed to evaluate the lower extremity deep venous systems from the level of the common femoral vein and including the common femoral, femoral, profunda femoral, popliteal and calf veins including the posterior tibial, peroneal and gastrocnemius veins when visible. The superficial great saphenous vein was also interrogated. Spectral Doppler was utilized to evaluate flow at rest and with distal augmentation maneuvers in the common femoral, femoral and popliteal veins. COMPARISON:  Nuclear medicine perfusion lung scan-05/04/2022. FINDINGS: RIGHT LOWER EXTREMITY Common Femoral Vein: No evidence of thrombus. Normal compressibility, respiratory phasicity  and response to augmentation. Saphenofemoral Junction: No evidence of thrombus. Normal compressibility and flow on color Doppler imaging. Profunda Femoral Vein: No evidence of thrombus. Normal compressibility and flow on color Doppler imaging. Femoral Vein: No evidence of thrombus. Normal compressibility, respiratory phasicity and response to augmentation. Popliteal Vein: No evidence of thrombus. Normal compressibility, respiratory phasicity and response to augmentation. Calf Veins: No evidence of thrombus. Normal compressibility and flow on color Doppler imaging. Superficial Great Saphenous Vein: No evidence of thrombus. Normal compressibility. Other Findings:  None. LEFT LOWER EXTREMITY Common Femoral Vein: No evidence of thrombus. Normal compressibility, respiratory phasicity and response to augmentation. Saphenofemoral Junction: No evidence of thrombus. Normal compressibility and flow on color Doppler imaging. Profunda Femoral Vein: No evidence of thrombus. Normal compressibility and flow on color Doppler imaging. Femoral Vein: No evidence of thrombus. Normal compressibility, respiratory phasicity and response to augmentation. Popliteal Vein: No evidence of thrombus. Normal compressibility, respiratory phasicity and response to augmentation. Calf Veins: No evidence of thrombus. Normal compressibility and flow on color Doppler imaging. Superficial Great Saphenous Vein: No evidence of thrombus. Normal compressibility. Other Findings:  None. IMPRESSION: No evidence of DVT within either lower extremity. Electronically Signed   By: Sandi Mariscal M.D.   On: 05/04/2022 08:11   NM Pulmonary Perfusion  Result Date: 05/02/2022 CLINICAL DATA:  Short of breath. EXAM: NUCLEAR MEDICINE PERFUSION LUNG SCAN TECHNIQUE: Perfusion images were obtained in multiple projections after intravenous injection of radiopharmaceutical. RADIOPHARMACEUTICALS:  4.26 mCi Tc-71mMAA COMPARISON:  Chest radiograph 05/20/2022 FINDINGS: Exam is  limited by patient's arms at side. Patient was unable to elevate arms for exam. Decreased region perfusion to the RIGHT lung apex on multiple projections. Perfusion defect seen on the RPO projection is favored related to arm attenuation. No wedge-shaped peripheral perfusion defect.s Chest radiograph demonstrates bibasilar airspace opacity and RIGHT effusion. RIGHT upper lobe relatively clear. IMPRESSION: Concern for RIGHT upper lobe pulmonary embolism. Intermediate probability. Limited exam due to arms at side. Electronically Signed   By: SSuzy BouchardM.D.   On: 05/02/2022 16:24   DG  Chest Port 1 View  Result Date: 05/02/2022 CLINICAL DATA:  Acute respiratory failure, hypoxia EXAM: PORTABLE CHEST 1 VIEW COMPARISON:  Previous studies including the examination done on 05/02/2022 FINDINGS: Transverse diameter of heart is increased. There is interval worsening of pulmonary vascular congestion. Increased interstitial and alveolar markings are seen in parahilar regions and lower lung fields. Small to moderate bilateral pleural effusions are seen with interval increase. Evaluation of lower lung fields for focal infiltrates is limited by the bilateral effusions. There is no pneumothorax. IMPRESSION: There is interval worsening of pulmonary vascular congestion suggesting worsening CHF. There is interval increase in opacification of both parahilar regions and lower lung fields suggesting increase in bilateral pleural effusions and worsening pulmonary edema. Electronically Signed   By: Elmer Picker M.D.   On: 05/02/2022 15:35   ECHOCARDIOGRAM COMPLETE  Result Date: 05/02/2022    ECHOCARDIOGRAM REPORT   Patient Name:   Dennis Serrano Date of Exam: 05/01/2022 Medical Rec #:  [id number]     Height:       69.0 in Accession #:    [id number]    Weight:       151.9 lb Date of Birth:  09/26/1932     BSA:          1.838 m Patient Age:    33 years      BP:           136/72 mmHg Patient Gender: M             HR:            75 bpm. Exam Location:  [facility] Procedure: 2D Echo Indications:     Elevated Troponin  History:         Patient has prior history of Echocardiogram examinations, most                  recent 10/04/2016.  Sonographer:     Kathlen Brunswick RDCS Referring Phys:  [id number] [place] M ECKSTAT Diagnosing Phys: Yolonda Kida MD  Sonographer Comments: Suboptimal parasternal window. Image acquisition challenging due to patient body habitus. IMPRESSIONS  1. Left ventricular ejection fraction, by estimation, is 50 to 55%. The left ventricle has low normal function. The left ventricle has no regional wall motion abnormalities. There is mild concentric left ventricular hypertrophy. Left ventricular diastolic parameters are consistent with Grade I diastolic dysfunction (impaired relaxation).  2. Right ventricular systolic function is normal. The right ventricular size is normal.  3. Left atrial size was mildly dilated.  4. Right atrial size was mildly dilated.  5. The mitral valve is normal in structure. Moderate mitral valve regurgitation.  6. Tricuspid valve regurgitation is mild to moderate.  7. The aortic valve is normal in structure. Aortic valve regurgitation is trivial. FINDINGS  Left Ventricle: Left ventricular ejection fraction, by estimation, is 50 to 55%. The left ventricle has low normal function. The left ventricle has no regional wall motion abnormalities. The left ventricular internal cavity size was normal in size. There is mild concentric left ventricular hypertrophy. Left ventricular diastolic parameters are consistent with Grade I diastolic dysfunction (impaired relaxation). Right Ventricle: The right ventricular size is normal. No increase in right ventricular wall thickness. Right ventricular systolic function is normal. Left Atrium: Left atrial size was mildly dilated. Right Atrium: Right atrial size was mildly dilated. Pericardium: There is no evidence of pericardial effusion. Mitral Valve: The mitral valve is  normal in structure. Moderate mitral valve regurgitation. Tricuspid Valve: The tricuspid valve  is normal in structure. Tricuspid valve regurgitation is mild to moderate. Aortic Valve: The aortic valve is normal in structure. Aortic valve regurgitation is trivial. Aortic regurgitation PHT measures 637 msec. Aortic valve peak gradient measures 5.2 mmHg. Pulmonic Valve: The pulmonic valve was normal in structure. Pulmonic valve regurgitation is not visualized. Aorta: The ascending aorta was not well visualized. IAS/Shunts: No atrial level shunt detected by color flow Doppler.  LEFT VENTRICLE PLAX 2D LVIDd:         4.90 cm     Diastology LVIDs:         3.60 cm     LV e' medial:    4.19 cm/s LV PW:         1.40 cm     LV E/e' medial:  19.4 LV IVS:        1.20 cm     LV e' lateral:   7.56 cm/s LVOT diam:     2.00 cm     LV E/e' lateral: 10.7 LV SV:         38 LV SV Index:   21 LVOT Area:     3.14 cm  LV Volumes (MOD) LV vol d, MOD A2C: 90.0 ml LV vol d, MOD A4C: 53.0 ml LV vol s, MOD A2C: 52.0 ml LV vol s, MOD A4C: 34.5 ml LV SV MOD A2C:     38.0 ml LV SV MOD A4C:     53.0 ml LV SV MOD BP:      27.1 ml RIGHT VENTRICLE RV Basal diam:  3.70 cm RV S prime:     7.56 cm/s TAPSE (M-mode): 1.4 cm LEFT ATRIUM             Index        RIGHT ATRIUM           Index LA diam:        4.40 cm 2.39 cm/m   RA Area:     14.20 cm LA Vol (A2C):   51.7 ml 28.13 ml/m  RA Volume:   35.60 ml  19.37 ml/m LA Vol (A4C):   40.0 ml 21.76 ml/m LA Biplane Vol: 47.7 ml 25.95 ml/m  AORTIC VALVE                 PULMONIC VALVE AV Area (Vmax): 1.76 cm     PV Vmax:       0.97 m/s AV Vmax:        114.00 cm/s  PV Peak grad:  3.8 mmHg AV Peak Grad:   5.2 mmHg LVOT Vmax:      63.95 cm/s LVOT Vmean:     40.350 cm/s LVOT VTI:       0.120 m AI PHT:         637 msec  AORTA Ao Root diam: 2.60 cm MITRAL VALVE                  TRICUSPID VALVE MV Area (PHT): 3.89 cm       TV Peak grad:   36.8 mmHg MV Decel Time: 195 msec       TV Vmax:        3.04 m/s MR Peak  grad:    110.2 mmHg   TR Peak grad:   41.7 mmHg MR Mean grad:    66.0 mmHg    TR Vmax:        323.00 cm/s MR Vmax:         525.00 cm/s  MR Vmean:        373.0 cm/s   SHUNTS MR PISA:         1.57 cm     Systemic VTI:  0.12 m MR PISA Eff ROA: 9 mm        Systemic Diam: 2.00 cm MR PISA Radius:  0.50 cm MV E velocity: 81.20 cm/s MV A velocity: 97.50 cm/s MV E/A ratio:  0.83 Dwayne D Callwood MD Electronically signed by Yolonda Kida MD Signature Date/Time: 05/02/2022/3:02:48 PM    Final    DG Chest Port 1 View  Result Date: 05/16/2022 CLINICAL DATA:  Respiratory distress EXAM: PORTABLE CHEST 1 VIEW COMPARISON:  Radiograph 08/24/2017 FINDINGS: Hazy airspace and interstitial opacities greatest in the right mid and lower lung. Possible small right pleural effusion. No pneumothorax. Stable cardiomediastinal silhouette. Aortic atherosclerotic calcification. No acute osseous abnormality. IMPRESSION: Nonspecific hazy opacities greatest in the right mid and lower lung can be seen with infection or edema. Electronically Signed   By: Placido Sou M.D.   On: 05/19/2022 23:38    TELEMETRY reviewed by me (LT) 05/04/2022 : Sinus rhythm 70s to 80s  EKG reviewed by me: Sinus rhythm 70s without acute ST or T wave abnormalities  Data reviewed by me (LT) 05/04/2022: Hospitalist progress note I& O vitals telemetry CBC BMP troponins BNP  Principal Problem:   Acute hypoxemic respiratory failure (HCC) Active Problems:   Severe sepsis (HCC)   CAP (community acquired pneumonia)   CAD (coronary artery disease)   COPD with acute exacerbation (HCC)   NSTEMI (non-ST elevated myocardial infarction) ([place])   AKI (acute kidney injury) ([place])   Known medical problems   Goals of care, counseling/discussion   Delirium due to multiple etiologies, acute, hyperactive   Acute pulmonary embolism (HCC)   Hyponatremia   Acute on chronic diastolic CHF (congestive heart failure) ([place])    ASSESSMENT AND PLAN:  Dennis Serrano is an  4yoM with a PMH of CAD with remote h/o stents, ?paroxysmal AF not on AC, dementia, DM2, Parkinson's, Prostate cancer s/p XRT (2012), HTN, HLD who presented to [facility] ED [facility]/11/[facility]22 with shortness of breath with a cough, he required BiPAP in the ED noted leukocytosis to 13,000 and lactic acidosis on admission.  His initial troponin was elevated to 9500 with peak of 11,500, echocardiogram showed preserved LVEF without significant WMA's, G1 DD and moderate MR.  He is being treated for community-acquired pneumonia and treated conservatively for NSTEMI.  Respiratory panel was positive for rhinovirus.  #Acute hypoxic respiratory failure 2/2 RUL pulmonary embolus #Multifocal pneumonia, rhinovirus -Remains on supplemental oxygen at 5 L HFNC, given empiric antibiotics.  VQ scan with concern for right upper lobe PE although indeterminate and limited exam due to arm positioning.  #elevated troponin c/w severe demand ischemia Troponin trended 9500-11500-10300-10700.  Without chest pain or concerning EKG changes. This is most consistent with severe demand ischemia from PE, and ACS. He is continued on IV heparin, also on this out of concern for pulmonary embolus as above.  Echo resulted with preserved LVEF 50-55%, G1 DD, and moderate MR. No WMAs.  -Continue aspirin 81 mg daily. Defer plavix.  -Heparin drip was started at [facility] on 11/5, continue this for now. Anticipate need for DOAC for treatment of PE as above.  -His home antihypertensives appear to include diltiazem 420 mg daily, hydralazine 25 mg 3 times daily -continue metoprolol XL '50mg'$  once daily. Stop diltiazem. Hold ACE inhibitor/ARB, MRA, SGLT2 with AKI as below.  -Continue  conservative management from a cardiac standpoint in the setting of the patient's advanced age, absence of chest pain, and worsening renal function as below  #Chronic HFpEF EF 50-55% BNP on admission 3000, does not appear significantly volume overloaded on exam.  Received sepsis fluids on  admission, these have been stopped out of concern for hypervolemia.  Still has a significant oxygen requirement of 5 L [place] today -s/p Lasix 40 mg IV x1, will dose another '40mg'$  IV x 2 and monitor renal function  #AKI Progressively worsened since admission with current BUN/creatinine 80/2.59 and GFR 23.  This patient's plan of care was discussed and created with Dr. Saralyn Pilar and he is in agreement.  Signed: Tristan Schroeder , PA-C 05/04/2022, 1:30 PM [facility] Cardiology

## 2022-05-04 NOTE — TOC Progression Note (Signed)
Transition of Care [facility]) - Progression Note    Patient Details  Name: Dennis Serrano MRN: [id number] Date of Birth: [birth year]  Transition of Care [facility]) CM/SW [place], RN Phone Number: 05/04/2022, 11:07 AM  Clinical Narrative:     Patient is confused and wearing mitts and has a sitter, I called the [facility] at [phone]   And left  general Voice mail asking for a return call, provided my contact information  Expected Discharge Plan: [place] Barriers to Discharge: Continued Medical Work up  Expected Discharge Plan and Services Expected Discharge Plan: [place]                                               Social Determinants of Health (SDOH) Interventions    Readmission Risk Interventions     No data to display

## 2022-05-04 NOTE — Plan of Care (Signed)
Patient sleeping between care. Oriented to self only. Oxygen on 5L. Heparin gtt infusing as ordered. Safety mittens on and sitter at bedside.   Problem: Education: Goal: Knowledge of disease or condition will improve Outcome: Progressing Goal: Knowledge of the prescribed therapeutic regimen will improve Outcome: Progressing Goal: Individualized Educational Video(s) Outcome: Progressing   Problem: Activity: Goal: Ability to tolerate increased activity will improve Outcome: Progressing Goal: Will verbalize the importance of balancing activity with adequate rest periods Outcome: Progressing   Problem: Respiratory: Goal: Ability to maintain a clear airway will improve Outcome: Progressing Goal: Levels of oxygenation will improve Outcome: Progressing Goal: Ability to maintain adequate ventilation will improve Outcome: Progressing   Problem: Activity: Goal: Ability to tolerate increased activity will improve Outcome: Progressing   Problem: Clinical Measurements: Goal: Ability to maintain a body temperature in the normal range will improve Outcome: Progressing   Problem: Respiratory: Goal: Ability to maintain adequate ventilation will improve Outcome: Progressing Goal: Ability to maintain a clear airway will improve Outcome: Progressing   Problem: Education: Goal: Knowledge of General Education information will improve Description: Including pain rating scale, medication(s)/side effects and non-pharmacologic comfort measures Outcome: Progressing   Problem: Health Behavior/Discharge Planning: Goal: Ability to manage health-related needs will improve Outcome: Progressing   Problem: Clinical Measurements: Goal: Ability to maintain clinical measurements within normal limits will improve Outcome: Progressing Goal: Will remain free from infection Outcome: Progressing Goal: Diagnostic test results will improve Outcome: Progressing Goal: Respiratory complications will  improve Outcome: Progressing Goal: Cardiovascular complication will be avoided Outcome: Progressing   Problem: Activity: Goal: Risk for activity intolerance will decrease Outcome: Progressing   Problem: Nutrition: Goal: Adequate nutrition will be maintained Outcome: Progressing   Problem: Coping: Goal: Level of anxiety will decrease Outcome: Progressing   Problem: Elimination: Goal: Will not experience complications related to bowel motility Outcome: Progressing Goal: Will not experience complications related to urinary retention Outcome: Progressing   Problem: Pain Managment: Goal: General experience of comfort will improve Outcome: Progressing   Problem: Safety: Goal: Ability to remain free from injury will improve Outcome: Progressing   Problem: Skin Integrity: Goal: Risk for impaired skin integrity will decrease Outcome: Progressing

## 2022-05-04 NOTE — Progress Notes (Signed)
ANTICOAGULATION CONSULT NOTE  Pharmacy Consult for heparin infusion Indication: ACS/STEMI  No Known Allergies  Patient Measurements: Height: '5\' 9"'$  (175.3 cm) Weight: 69.9 kg (154 lb 1.6 oz) IBW/kg (Calculated) : 70.7 Heparin Dosing Weight: 69.8 kg  Vital Signs: Temp: 97.9 F (36.6 C) (11/08 0350) Temp Source: Axillary (11/08 0350) BP: 134/56 (11/08 0350) Pulse Rate: 80 (11/08 0350)  Labs: Recent Labs    05/01/22 0649 05/01/22 1759 05/02/22 0228 05/02/22 0947 05/02/22 0628 05/02/22 0628 05/03/22 0441 05/03/22 1620 05/04/22 0538  HGB  --   --   --   --  9.2*   < > 8.7*  --  8.7*  HCT  --   --   --   --  27.8*  --  25.9*  --  25.5*  PLT  --   --   --   --  262  --  268  --  276  HEPARINUNFRC 0.19*   < > 0.64  --   --   --  0.32  --  0.35  CREATININE  --    < > 2.24*  --   --   --  2.59* 2.88* 2.80*  TROPONINIHS 10,343*  --   --  09,628*  --   --   --   --   --    < > = values in this interval not displayed.     Estimated Creatinine Clearance: 18.4 mL/min (A) (by C-G formula based on SCr of 2.8 mg/dL (H)).   Medical History: Past Medical History:  Diagnosis Date   Anxiety    CAD (coronary artery disease)    Dementia ([place])    Diabetes mellitus without complication (HCC)    GERD (gastroesophageal reflux disease)    History of heart artery stent    HLD (hyperlipidemia)    HTN (hypertension)    Parkinson disease    Prostate cancer ([place])    over 5 years ago   Squamous cell carcinoma of skin 06/08/2021   L vertex, EDC    Assessment: Pt is a 88 yo male presenting to ED w/ respiratory distress found with elevated troponin I and diagnoses w/ nSTEMI.  11/5  0649  HL=0.19 Subtherapeutic  inc drip 900 to 1100 u/hr 11/5 1759   HL=0.66 Therapeutic x1; 1100 un/hr 11/6 0228 HL 64, therapeutic x 2 11/7 0441 HL = 0.32, therapeutic X 3  11/8 0538 HL = 0.35, therapeutic X 4  Goal of Therapy:  Heparin level 0.3-0.7 units/ml Monitor platelets by anticoagulation  protocol: Yes   Plan:  11/8:  HL @ 0538 = 0.35, therapeutic X 4 Will continue pt on current rate and recheck HL on 11/9 with AM labs.   Mckenzye Cutright D 05/04/2022 6:35 AM

## 2022-05-05 DIAGNOSIS — J9601 Acute respiratory failure with hypoxia: Secondary | ICD-10-CM | POA: Diagnosis not present

## 2022-05-05 DIAGNOSIS — Z7189 Other specified counseling: Secondary | ICD-10-CM | POA: Diagnosis not present

## 2022-05-05 DIAGNOSIS — J189 Pneumonia, unspecified organism: Secondary | ICD-10-CM | POA: Diagnosis not present

## 2022-05-05 DIAGNOSIS — I2699 Other pulmonary embolism without acute cor pulmonale: Secondary | ICD-10-CM | POA: Diagnosis not present

## 2022-05-05 DIAGNOSIS — A419 Sepsis, unspecified organism: Secondary | ICD-10-CM | POA: Diagnosis not present

## 2022-05-05 LAB — BASIC METABOLIC PANEL
Anion gap: 8 (ref 5–15)
BUN: 95 mg/dL — ABNORMAL HIGH (ref 8–23)
CO2: 20 mmol/L — ABNORMAL LOW (ref 22–32)
Calcium: 8.5 mg/dL — ABNORMAL LOW (ref 8.9–10.3)
Chloride: 108 mmol/L (ref 98–111)
Creatinine, Ser: 2.6 mg/dL — ABNORMAL HIGH (ref 0.61–1.24)
GFR, Estimated: 23 mL/min — ABNORMAL LOW (ref >60)
Glucose, Bld: 186 mg/dL — ABNORMAL HIGH (ref 70–99)
Potassium: 4.5 mmol/L (ref 3.5–5.1)
Sodium: 136 mmol/L (ref 135–145)

## 2022-05-05 LAB — CULTURE, BLOOD (ROUTINE X 2)
Culture: NO GROWTH
Culture: NO GROWTH
Special Requests: ADEQUATE
Special Requests: ADEQUATE

## 2022-05-05 LAB — CBC
HCT: 26.5 % — ABNORMAL LOW (ref 39.0–52.0)
Hemoglobin: 8.9 g/dL — ABNORMAL LOW (ref 13.0–17.0)
MCH: 28.2 pg (ref 26.0–34.0)
MCHC: 33.6 g/dL (ref 30.0–36.0)
MCV: 83.9 fL (ref 80.0–100.0)
Platelets: 302 10*3/uL (ref 150–400)
RBC: 3.16 MIL/uL — ABNORMAL LOW (ref 4.22–5.81)
RDW: 14.6 % (ref 11.5–15.5)
WBC: 9.2 10*3/uL (ref 4.0–10.5)
nRBC: 0 % (ref 0.0–0.2)

## 2022-05-05 LAB — GLUCOSE, CAPILLARY
Glucose-Capillary: 160 mg/dL — ABNORMAL HIGH (ref 70–99)
Glucose-Capillary: 177 mg/dL — ABNORMAL HIGH (ref 70–99)
Glucose-Capillary: 242 mg/dL — ABNORMAL HIGH (ref 70–99)
Glucose-Capillary: 222 mg/dL — ABNORMAL HIGH (ref 70–99)

## 2022-05-05 LAB — MAGNESIUM: Magnesium: 2.2 mg/dL (ref 1.7–2.4)

## 2022-05-05 MED ORDER — SALINE SPRAY 0.65 % NA SOLN: 1.0000 | NASAL | Status: DC | PRN

## 2022-05-05 MED ORDER — AMOXICILLIN-POT CLAVULANATE 500-125 MG PO TABS
1.0000 | ORAL_TABLET | Freq: Two times a day (BID) | ORAL | Status: AC
  Administered 2022-05-05 – 2022-05-06 (×3): 1 via ORAL
  Filled 2022-05-05 (×3): qty 1

## 2022-05-05 MED ORDER — IPRATROPIUM-ALBUTEROL 0.5-2.5 (3) MG/3ML IN SOLN
3.0000 mL | Freq: Two times a day (BID) | RESPIRATORY_TRACT | Status: DC
  Administered 2022-05-05 – 2022-05-06 (×3): 3 mL via RESPIRATORY_TRACT
  Filled 2022-05-05 (×3): qty 3

## 2022-05-05 NOTE — Consult Note (Signed)
Consultation Note Date: 05/05/2022   Patient Name: Dennis Serrano  DOB: 12-28-1932  MRN: [id number]  Age / Sex: 88 y.o., male  PCP: Juline Patch, MD Referring Physician: Jennye Boroughs, MD  Reason for Consultation: Establishing goals of care  HPI/Patient Profile:  Dennis Serrano is an 85yoM with a PMH of CAD with remote h/o stents, ?paroxysmal AF not on AC, dementia, DM2, Parkinson's, Prostate cancer s/p XRT (2012), HTN, HLD who presented to [facility] ED [facility]/12/2022 with shortness of breath with a cough, he required BiPAP in the ED noted leukocytosis to 13,000 and lactic acidosis on admission.  His initial troponin was elevated to 9500 with peak of 11,500, echocardiogram showed preserved LVEF without significant WMA's, G1 DD and moderate MR.  He is being treated for community-acquired pneumonia and treated conservatively for NSTEMI.  Respiratory panel was positive for rhinovirus.  Clinical Assessment and Goals of Care: Notes and labs reviewed. In to see patient. He has a Actuary at bedside and is sleeping.   Called to speak with daughter Dennis Serrano. Dennis Serrano states she works for [employer].   She states she and her niece are co-legal surrogate decision makers. She states her brother has health issues of his own, and will be okay with whatever decisions they make. She states her other brother died a couple of years ago.   She states that at baseline, her father feeds himself. He sometimes has incontinence as he can't get to the bathroom before relieving himself. She states sometimes he does not recognize her, and sometimes cannot think of her name. When they talk, he asks the same questions.   We discussed his diagnoses, prognosis, GOC, EOL wishes disposition and options.  Created space and opportunity for patient  to explore thoughts and feelings regarding current medical information.   A detailed discussion  was had today regarding advanced directives.  Concepts specific to code status, artifical feeding and hydration, IV antibiotics and rehospitalization were discussed.  The difference between an aggressive medical intervention path and a comfort care path was discussed.  Values and goals of care important to patient and family were attempted to be elicited.  Discussed limitations of medical interventions to prolong quality of life in some situations and discussed the concept of human mortality.  Daughter states he was always a strong and independent man. She states he would not want a situation where people are having to care for him. She states he has stated multiple times he is ready to go since losing his wife and his son.  We discussed planning. She states their mother, the patient's wife died at home with hospice. She states her brother was diverting narcotics and so going home with hospice where son will need to be a caretaker will not be an option. Discussed meeting tomorrow for further discussion.          SUMMARY OF RECOMMENDATIONS   Meet family tomorrow at 10:00. Leaning towards comfort care.      Primary Diagnoses: Present on Admission:  Acute hypoxemic  respiratory failure (HCC)  Severe sepsis (HCC)  CAP (community acquired pneumonia)  CAD (coronary artery disease)  NSTEMI (non-ST elevated myocardial infarction) ([place])  Known medical problems  Acute on chronic diastolic CHF (congestive heart failure) ([place])   I have reviewed the medical record, interviewed the patient and family, and examined the patient. The following aspects are pertinent.  Past Medical History:  Diagnosis Date   Anxiety    CAD (coronary artery disease)    Dementia ([place])    Diabetes mellitus without complication (HCC)    GERD (gastroesophageal reflux disease)    History of heart artery stent    HLD (hyperlipidemia)    HTN (hypertension)    Parkinson disease    Prostate cancer ([place])    over 5 years  ago   Squamous cell carcinoma of skin 06/08/2021   L vertex, EDC   Social History   Socioeconomic History   Marital status: Widowed    Spouse name: Not on file   Number of children: Not on file   Years of education: Not on file   Highest education level: Not on file  Occupational History   Not on file  Tobacco Use   Smoking status: Former    Types: Cigarettes    Quit date: 25    Years since quitting: 43.8   Smokeless tobacco: Never  Substance and Sexual Activity   Alcohol use: No   Drug use: No   Sexual activity: Not on file  Other Topics Concern   Not on file  Social History Narrative   Not on file   Social Determinants of Health   Financial Resource Strain: Not on file  Food Insecurity: Not on file  Transportation Needs: Not on file  Physical Activity: Not on file  Stress: Not on file  Social Connections: Not on file   Family History  Problem Relation Age of Onset   Heart disease Mother    Heart disease Father    Cancer Sister    Cancer Brother    Scheduled Meds:  amLODipine  5 mg Oral Daily   amoxicillin-clavulanate  1 tablet Oral BID   apixaban  10 mg Oral BID   Followed by   Derrill Memo ON 04/27/2022] apixaban  5 mg Oral BID   aspirin  81 mg Oral Daily   atorvastatin  40 mg Oral Daily   carbidopa-levodopa  1 tablet Oral TID   Chlorhexidine Gluconate Cloth  6 each Topical Daily   feeding supplement (NEPRO CARB STEADY)  237 mL Oral TID BM   haloperidol lactate  2 mg Intravenous Once   hydrALAZINE  25 mg Oral TID   insulin aspart  0-5 Units Subcutaneous QHS   insulin aspart  0-6 Units Subcutaneous TID WC   ipratropium-albuterol  3 mL Nebulization TID   metoprolol tartrate  25 mg Oral BID   multivitamin with minerals  1 tablet Oral Daily   tamsulosin  0.4 mg Oral Daily   Continuous Infusions: PRN Meds:.acetaminophen **OR** acetaminophen, haloperidol lactate, labetalol, ondansetron **OR** ondansetron (ZOFRAN) IV, oxyCODONE, polyethylene glycol,  traZODone Medications Prior to Admission:  Prior to Admission medications   Medication Sig Start Date End Date Taking? Authorizing Provider  amLODipine (NORVASC) 5 MG tablet Take 5 mg by mouth daily.   Yes [provider]  aspirin 325 MG tablet Take 1 tablet (325 mg total) by mouth daily. Patient taking differently: Take 81 mg by mouth daily. 10/08/16  Yes [name], MD  carbidopa-levodopa (SINEMET IR) 25-100 MG  tablet Take 1 tablet by mouth 3 (three) times daily.   Yes [provider]  dilTIAZem HCl ER 420 MG TB24 Take 1 tablet by mouth daily.   Yes [provider]  hydrALAZINE (APRESOLINE) 25 MG tablet Take 25 mg by mouth 3 (three) times daily.   Yes [provider]  lisinopril (PRINIVIL,ZESTRIL) 40 MG tablet Take 40 mg by mouth daily.   Yes [provider]  metFORMIN (GLUCOPHAGE) 1000 MG tablet Take 1,000 mg by mouth daily with breakfast.   Yes [provider]  spironolactone (ALDACTONE) 25 MG tablet Take 25 mg by mouth daily. 11/10/19  Yes [provider]  tamsulosin (FLOMAX) 0.4 MG CAPS capsule Take 0.4 mg by mouth daily. 12/31/19  Yes [provider]  acetaminophen (TYLENOL) 500 MG tablet Take 500 mg by mouth every 8 (eight) hours.    [provider]  celecoxib (CELEBREX) 200 MG capsule Take 200 mg by mouth daily. Patient not taking: Reported on 05/01/2022    [provider]  cloNIDine (CATAPRES) 0.1 MG tablet Take 0.1 mg by mouth daily. Patient not taking: Reported on 05/01/2022    [provider]  gemfibrozil (LOPID) 600 MG tablet  01/06/22   [provider]  mupirocin ointment (BACTROBAN) 2 % Apply once to twice a day to affected area scalp until healed. Patient not taking: Reported on 05/01/2022 06/08/21   Brendolyn Patty, MD  ondansetron (ZOFRAN ODT) 4 MG disintegrating tablet Take 1 tablet (4 mg total) by mouth every 8 (eight) hours as needed for nausea or vomiting. Patient  not taking: Reported on 05/01/2022 11/21/20   [name], MD   No Known Allergies Review of Systems  Unable to perform ROS   Physical Exam Constitutional:      Comments: Eyes closed.   Pulmonary:     Effort: Pulmonary effort is normal.     Vital Signs: BP (!) 160/64 (BP Location: Right Arm)   Pulse 68   Temp 98 F (36.7 C)   Resp 18   Ht '5\' 9"'$  (1.753 m)   Wt 69.9 kg   SpO2 98%   BMI 22.76 kg/m  Pain Scale: PAINAD   Pain Score: 0-No pain   SpO2: SpO2: 98 % O2 Device:SpO2: 98 % O2 Flow Rate: .O2 Flow Rate (L/min): 5 L/min  IO: Intake/output summary:  Intake/Output Summary (Last 24 hours) at 05/05/2022 1336 Last data filed at 05/05/2022 1303 Gross per 24 hour  Intake 360 ml  Output 1150 ml  Net -790 ml    LBM: Last BM Date : 05/04/2022 Baseline Weight: Weight: 69.8 kg Most recent weight: Weight: 69.9 kg       Signed by: Asencion Gowda, NP   Please contact Palliative Medicine Team phone at [phone] for questions and concerns.  For individual provider: See Shea Evans

## 2022-05-05 NOTE — TOC Progression Note (Signed)
Transition of Care [facility]) - Progression Note    Patient Details  Name: Dennis Serrano MRN: [id number] Date of Birth: February 17, 1933  Transition of Care [facility]) CM/SW [place], RN Phone Number: 05/05/2022, 3:31 PM  Clinical Narrative:     Spoke with the patient's niece and reviewed the bed options with her, she took it down and will review with [name], They have a Hospice meeting scheduled as well, I reviewed with her that the patient  may not be able to get Ins approval due to dementia, I explained that Insurance doe snot cover Memory care, He would have to have long term insurance or medicaid to pay for that otherwise it is out of pocket She stated understanding I explained I would be happy to submit for approval but wanted them to be aware it would have to be approved by insurance to go to [facility]  Expected Discharge Plan: Skilled Nursing Facility Barriers to Discharge: Continued Medical Work up  Expected Discharge Plan and Services Expected Discharge Plan: [place]                                               Social Determinants of Health (SDOH) Interventions    Readmission Risk Interventions     No data to display

## 2022-05-05 NOTE — Plan of Care (Signed)
  Problem: Respiratory: Goal: Ability to maintain adequate ventilation will improve Outcome: Progressing   Problem: Education: Goal: Knowledge of General Education information will improve Description: Including pain rating scale, medication(s)/side effects and non-pharmacologic comfort measures Outcome: Progressing   Problem: Health Behavior/Discharge Planning: Goal: Ability to manage health-related needs will improve Outcome: Progressing   Problem: Clinical Measurements: Goal: Respiratory complications will improve Outcome: Progressing   Problem: Coping: Goal: Level of anxiety will decrease Outcome: Progressing   Problem: Skin Integrity: Goal: Risk for impaired skin integrity will decrease Outcome: Progressing

## 2022-05-05 NOTE — Progress Notes (Signed)
Physical Therapy Treatment Patient Details Name: Dennis Serrano MRN: [id number] DOB: 10/24/1932 Today's Date: 05/05/2022   History of Present Illness Pt is an 88 y.o. male presenting to hospital 04/29/2022 with c/o cough and respiratory distress.  Pt admitted with acute hypoxemic respiratory failure--(+) rhinovirus, CAP, severe sepsis, AKI, NSTEMI, and acute COPD exacerbation.  Imaging showing concern for R upper lobe PE 11/6.  PMH includes Parkinson's disease, CAD, DM, anxiety, htn, prostate CA, Parkinson's disease, a-fib, and dementia.    PT Comments    Pt resting in bed upon PT arrival; pt sleeping but woken with vc's and tactile cues; pt's daughter present.  During session pt mod assist with bed mobility and max assist stand step turn bed to recliner.  Intermittent min assist for sitting balance d/t posterior lean.  No c/o pain.  Will continue to focus on strengthening and progressive functional mobility during hospitalization.    Recommendations for follow up therapy are one component of a multi-disciplinary discharge planning process, led by the attending physician.  Recommendations may be updated based on patient status, additional functional criteria and insurance authorization.  Follow Up Recommendations  Skilled nursing-short term rehab (<3 hours/day) Can patient physically be transported by private vehicle: No   Assistance Recommended at Discharge Frequent or constant Supervision/Assistance  Patient can return home with the following Two people to help with walking and/or transfers;Two people to help with bathing/dressing/bathroom;Assistance with cooking/housework;Direct supervision/assist for medications management;Direct supervision/assist for financial management;Assist for transportation;Help with stairs or ramp for entrance   Equipment Recommendations  BSC/3in1;Wheelchair (measurements PT);Wheelchair cushion (measurements PT);Hospital bed;Other (comment) (hoyer lift)     Recommendations for Other Services       Precautions / Restrictions Precautions Precautions: Fall Precaution Comments: Aspiration Restrictions Weight Bearing Restrictions: No     Mobility  Bed Mobility Overal bed mobility: Needs Assistance Bed Mobility: Supine to Sit     Supine to sit: Mod assist, HOB elevated     General bed mobility comments: assist for trunk and B LE's; vc's for technique    Transfers Overall transfer level: Needs assistance Equipment used: None Transfers: Sit to/from Stand, Bed to chair/wheelchair/BSC Sit to Stand: Max assist   Step pivot transfers: Max assist, +2 safety/equipment       General transfer comment: pt unable to come to full stand x2 trials but able to come to almost upright stand 3rd trial and then take a couple steps bed to recliner with B UE support on therapists arms; 2nd assist for safety; vc's for technique    Ambulation/Gait               General Gait Details: not appropriate at this time   Stairs             Wheelchair Mobility    Modified Rankin (Stroke Patients Only)       Balance Overall balance assessment: Needs assistance Sitting-balance support: Feet supported, Bilateral upper extremity supported Sitting balance-Leahy Scale: Fair Sitting balance - Comments: steady static sitting but intermittently leaning posterior requiring min assist and cueing for sitting upright   Standing balance support: Bilateral upper extremity supported, During functional activity Standing balance-Leahy Scale: Poor                              Cognition Arousal/Alertness: Awake/alert Behavior During Therapy: Flat affect Overall Cognitive Status: History of cognitive impairments - at baseline  General Comments: Oriented to name        Exercises      General Comments  Nursing cleared pt for participation in physical therapy.  Pt's daughter present and  agreeable to PT session.      Pertinent Vitals/Pain Pain Assessment Pain Assessment: Faces Faces Pain Scale: No hurt Pain Intervention(s): Limited activity within patient's tolerance, Monitored during session, Repositioned O2 sats 89-90% on 5 L O2 via nasal cannula with mobility but 94% or greater at rest; HR WFL.    Home Living                          Prior Function            PT Goals (current goals can now be found in the care plan section) Acute Rehab PT Goals Patient Stated Goal: to improve mobility PT Goal Formulation: With patient Time For Goal Achievement: 05/17/22 Potential to Achieve Goals: Fair Progress towards PT goals: Progressing toward goals    Frequency    Min 2X/week      PT Plan Current plan remains appropriate    Co-evaluation              AM-PAC PT "6 Clicks" Mobility   Outcome Measure  Help needed turning from your back to your side while in a flat bed without using bedrails?: A Lot Help needed moving from lying on your back to sitting on the side of a flat bed without using bedrails?: A Lot Help needed moving to and from a bed to a chair (including a wheelchair)?: A Lot Help needed standing up from a chair using your arms (e.g., wheelchair or bedside chair)?: A Lot Help needed to walk in hospital room?: Total Help needed climbing 3-5 steps with a railing? : Total 6 Click Score: 10    End of Session Equipment Utilized During Treatment: Gait belt Activity Tolerance: Patient limited by fatigue Patient left: in chair;with call bell/phone within reach;with chair alarm set;with nursing/sitter in room;with family/visitor present Nurse Communication: Mobility status;Precautions PT Visit Diagnosis: Other abnormalities of gait and mobility (R26.89);Muscle weakness (generalized) (M62.81);History of falling (Z91.81)     Time: 1130-1156 PT Time Calculation (min) (ACUTE ONLY): 26 min  Charges:  $Therapeutic Activity: 23-37 mins                     Leitha Bleak, PT 05/05/22, 1:23 PM

## 2022-05-05 NOTE — TOC Progression Note (Signed)
Transition of Care [facility]) - Progression Note    Patient Details  Name: Dennis Serrano MRN: [id number] Date of Birth: 1932-07-02  Transition of Care [facility]) CM/SW [place], RN Phone Number: 05/05/2022, 2:08 PM  Clinical Narrative:     Called the patient's daughter [name] and left a VM asking for a return call to review bed offers  Expected Discharge Plan: [place] Barriers to Discharge: Continued Medical Work up  Expected Discharge Plan and Services Expected Discharge Plan: [place]                                               Social Determinants of Health (SDOH) Interventions    Readmission Risk Interventions     No data to display

## 2022-05-05 NOTE — Progress Notes (Signed)
Progress Note    Dennis Serrano  HGD:[id number] DOB: June 22, 1933  DOA: 05/13/2022 PCP: Juline Patch, MD      Brief Narrative:    Medical records reviewed and are as summarized below:   Dennis Serrano is a 88 y.o. male with medical history significant for CAD, Alzheimer's disease, hypertension, hyperlipidemia, Parkinson's, persistent A-fib, type 2 diabetes, COPD, who presented to the ED around 11 pm on 05/09/2022 for evaluation of worsening shortness of breath.    Patient was admitted for acute respiratory failure with hypoxia due to multifocal pneumonia.  He was started on IV antibiotics for working diagnosis of pneumonia, and IV heparin for possible ACS, with hs-troponin elevated (peaked above 11k) in the absence of chest pain.  Cardiology consulted.   11/5: on 12 L/min HFNC oxygen, otherwise stable, no CP  11/6: O2 needs improving, weaned 6 >> 4 L/min HFNC O2 this morning.  Delirium noted overnight.    11/7: remains on 5 L/min O2.  Stable for transfer to floor.        Assessment/Plan:   Principal Problem:   Acute hypoxemic respiratory failure (HCC) Active Problems:   Severe sepsis (HCC)   CAP (community acquired pneumonia)   CAD (coronary artery disease)   COPD with acute exacerbation (HCC)   NSTEMI (non-ST elevated myocardial infarction) ([place])   AKI (acute kidney injury) ([place])   Known medical problems   Goals of care, counseling/discussion   Delirium due to multiple etiologies, acute, hyperactive   Acute pulmonary embolism (HCC)   Hyponatremia   Acute on chronic diastolic CHF (congestive heart failure) ([place])    Body mass index is 22.76 kg/m.   Severe sepsis community-acquired multifocal pneumonia: Continue Augmentin.  No growth on blood cultures.  Acute on chronic diastolic CHF: Hold Lasix.  Monitor BMP 2D echo showed EF estimated at 50 to 55%, mild concentric LVH, grade 1 diastolic dysfunction, moderate MR, mild to moderate TR.  Probable acute  pulmonary embolism: Continue Eliquis. No evidence of DVT on venous duplex of the lower extremities.  Acute hypoxic respiratory failure: He is requiring 5 L/min oxygen via nasal:.  Taper off oxygen as able.  He was not on any oxygen at home according to his daughter.  Delirium with underlying dementia: continue delirium precaution and supportive care  CAD with coronary stent, elevated troponins, demand ischemia: Continue aspirin.   COPD with acute exacerbation: Completed prednisone 05/05/2022.  Continue bronchodilators.  AKI on CKD stage IIIa: Creatinine was 1.39 on 11/02/2021.  Monitor BMP.  Other comorbidities include hypertension, Parkinson's disease, hyperlipidemia  Plan of care was discussed with Dennis Serrano, daughter, over the phone.  Goals of care were discussed.  She is contemplating comfort measures but she wants to discuss this with her brother.  She is agreeable to hospice consult.   Diet Order             DIET - DYS 1 Room service appropriate? Yes with Assist; Fluid consistency: Thin  Diet effective now                            Consultants: Cardiologist  Procedures: None    Medications:    amLODipine  5 mg Oral Daily   apixaban  10 mg Oral BID   Followed by   Derrill Memo ON 05/17/2022] apixaban  5 mg Oral BID   aspirin  81 mg Oral Daily   atorvastatin  40 mg Oral Daily  carbidopa-levodopa  1 tablet Oral TID   Chlorhexidine Gluconate Cloth  6 each Topical Daily   feeding supplement (NEPRO CARB STEADY)  237 mL Oral TID BM   haloperidol lactate  2 mg Intravenous Once   hydrALAZINE  25 mg Oral TID   insulin aspart  0-5 Units Subcutaneous QHS   insulin aspart  0-6 Units Subcutaneous TID WC   ipratropium-albuterol  3 mL Nebulization TID   metoprolol tartrate  25 mg Oral BID   multivitamin with minerals  1 tablet Oral Daily   tamsulosin  0.4 mg Oral Daily   Continuous Infusions:     Anti-infectives (From admission, onward)    Start     Dose/Rate Route  Frequency Ordered Stop   05/03/22 2200  amoxicillin-clavulanate (AUGMENTIN) 500-125 MG per tablet 1 tablet        1 tablet Oral 2 times daily 05/03/22 1231 05/04/22 2220   05/02/22 2200  azithromycin (ZITHROMAX) tablet 500 mg  Status:  Discontinued        500 mg Oral Daily at bedtime 05/02/22 0936 05/03/22 1230   05/01/22 0015  cefTRIAXone (ROCEPHIN) 2 g in sodium chloride 0.9 % 100 mL IVPB  Status:  Discontinued        2 g 200 mL/hr over 30 Minutes Intravenous Every 24 hours 05/01/22 0003 05/03/22 1230   05/01/22 0015  azithromycin (ZITHROMAX) 500 mg in sodium chloride 0.9 % 250 mL IVPB  Status:  Discontinued        500 mg 250 mL/hr over 60 Minutes Intravenous Every 24 hours 05/01/22 0003 05/02/22 0935              Family Communication/Anticipated D/C date and plan/Code Status   DVT prophylaxis:  apixaban (ELIQUIS) tablet 10 mg  apixaban (ELIQUIS) tablet 5 mg     Code Status: DNR  Family Communication: Plan discussed with Dennis Serrano, daughter, over the phone Disposition Plan: Plan to discharge to SNF   Status is: Inpatient Remains inpatient appropriate because: Delirium, hypoxia       Subjective:   Interval events.  He is confused and cannot provide any history.  There is a sitter at the bedside.  Objective:    Vitals:   05/04/22 2153 05/05/22 0738 05/05/22 0745 05/05/22 1245  BP: 138/63  (!) 179/69 (!) 160/64  Pulse: 80  98 68  Resp: 18  18   Temp: 98.5 F (36.9 C)  98 F (36.7 C)   TempSrc:      SpO2: 97% 95% 99% 98%  Weight:      Height:       No data found.   Intake/Output Summary (Last 24 hours) at 05/05/2022 1257 Last data filed at 05/05/2022 1036 Gross per 24 hour  Intake 240 ml  Output 1150 ml  Net -910 ml   Filed Weights   05/01/22 0051 05/01/22 1209 05/04/22 0500  Weight: 69.8 kg 68.9 kg 69.9 kg    Exam:    GEN: NAD SKIN: Warm and dry EYES: No pallor or icterus ENT: MMM CV: RRR PULM: Bibasilar rales, no wheezing ABD:  soft, ND, NT, +BS CNS: Alert, disoriented EXT: No edema or tenderness      Data Reviewed:   I have personally reviewed following labs and imaging studies:  Labs: Labs show the following:   Basic Metabolic Panel: Recent Labs  Lab 05/06/2022 2326 05/01/22 0223 05/02/22 0228 05/03/22 0441 05/03/22 1620 05/04/22 0538 05/05/22 0512  NA 134*   < > 133* 132*  131* 131* 136  K 5.3*   < > 4.9 4.5 5.0 4.6 4.5  CL 101   < > 104 103 103 103 108  CO2 20*   < > 17* 17* 17* 19* 20*  GLUCOSE 223*   < > 199* 206* 253* 200* 186*  BUN 42*   < > 58* 80* 89* 93* 95*  CREATININE 2.20*   < > 2.24* 2.59* 2.88* 2.80* 2.60*  CALCIUM 9.0   < > 8.5* 8.4* 8.7* 8.3* 8.5*  MG 1.7  --  1.9 2.1  --  2.1 2.2  PHOS  --   --   --  4.3  --   --   --    < > = values in this interval not displayed.   GFR Estimated Creatinine Clearance: 19.8 mL/min (A) (by C-G formula based on SCr of 2.6 mg/dL (H)). Liver Function Tests: Recent Labs  Lab 05/12/2022 2326 05/01/22 0223  AST 91* 82*  ALT <5 8  ALKPHOS 117 106  BILITOT 0.4 0.4  PROT 7.5 7.0  ALBUMIN 3.8 3.3*   No results for input(s): "LIPASE", "AMYLASE" in the last 168 hours. No results for input(s): "AMMONIA" in the last 168 hours. Coagulation profile No results for input(s): "INR", "PROTIME" in the last 168 hours.  CBC: Recent Labs  Lab 05/15/2022 2326 05/01/22 0223 05/02/22 0628 05/03/22 0441 05/04/22 0538 05/05/22 0512  WBC 13.6* 11.3* 13.0* 10.1 9.2 9.2  NEUTROABS 12.1*  --   --   --   --   --   HGB 11.1* 10.4* 9.2* 8.7* 8.7* 8.9*  HCT 34.7* 32.5* 27.8* 25.9* 25.5* 26.5*  MCV 87.6 87.4 85.0 83.5 83.6 83.9  PLT 263 285 262 268 276 302   Cardiac Enzymes: No results for input(s): "CKTOTAL", "CKMB", "CKMBINDEX", "TROPONINI" in the last 168 hours. BNP (last 3 results) No results for input(s): "PROBNP" in the last 8760 hours. CBG: Recent Labs  Lab 05/04/22 1125 05/04/22 1640 05/04/22 2158 05/05/22 0748 05/05/22 1124  GLUCAP 202* 321*  189* 160* 177*   D-Dimer: No results for input(s): "DDIMER" in the last 72 hours. Hgb A1c: No results for input(s): "HGBA1C" in the last 72 hours. Lipid Profile: Recent Labs    05/04/22 0538  CHOL 160  HDL 37*  LDLCALC 103*  TRIG 99  CHOLHDL 4.3   Thyroid function studies: No results for input(s): "TSH", "T4TOTAL", "T3FREE", "THYROIDAB" in the last 72 hours.  Invalid input(s): "FREET3" Anemia work up: No results for input(s): "VITAMINB12", "FOLATE", "FERRITIN", "TIBC", "IRON", "RETICCTPCT" in the last 72 hours. Sepsis Labs: Recent Labs  Lab 05/17/2022 2326 05/01/22 0223 05/01/22 1437 05/02/22 0628 05/03/22 0441 05/03/22 1620 05/04/22 0538 05/05/22 0512  PROCALCITON 0.38  --   --   --   --  0.16  --   --   WBC 13.6*   < >  --  13.0* 10.1  --  9.2 9.2  LATICACIDVEN 3.6*  --  1.8  --   --   --   --   --    < > = values in this interval not displayed.    Microbiology Recent Results (from the past 240 hour(s))  Culture, blood (routine x 2)     Status: None   Collection Time: 05/08/2022 11:26 PM   Specimen: BLOOD  Result Value Ref Range Status   Specimen Description BLOOD BLOOD LEFT HAND  Final   Special Requests   Final    BOTTLES DRAWN AEROBIC AND ANAEROBIC  Blood Culture adequate volume   Culture   Final    NO GROWTH 5 DAYS Performed at [facility], [address] [place], [place] [address]    Report Status 05/05/2022 FINAL  Final  Culture, blood (routine x 2)     Status: None   Collection Time: 05/20/2022 11:26 PM   Specimen: BLOOD  Result Value Ref Range Status   Specimen Description BLOOD BLOOD RIGHT ARM  Final   Special Requests   Final    BOTTLES DRAWN AEROBIC AND ANAEROBIC Blood Culture adequate volume   Culture   Final    NO GROWTH 5 DAYS Performed at [facility], [address] [place], [place] [address]    Report Status 05/05/2022 FINAL  Final  Resp Panel by RT-PCR (Flu A&B, Covid) Anterior Nasal Swab     Status: None    Collection Time: 05/18/2022 11:26 PM   Specimen: Anterior Nasal Swab  Result Value Ref Range Status   SARS Coronavirus 2 by RT PCR NEGATIVE NEGATIVE Final    Comment: (NOTE) SARS-CoV-2 target nucleic acids are NOT DETECTED.  The SARS-CoV-2 RNA is generally detectable in upper respiratory specimens during the acute phase of infection. The lowest concentration of SARS-CoV-2 viral copies this assay can detect is 138 copies/mL. A negative result does not preclude SARS-Cov-2 infection and should not be used as the sole basis for treatment or other patient management decisions. A negative result may occur with  improper specimen collection/handling, submission of specimen other than nasopharyngeal swab, presence of viral mutation(s) within the areas targeted by this assay, and inadequate number of viral copies(<138 copies/mL). A negative result must be combined with clinical observations, patient history, and epidemiological information. The expected result is Negative.  Fact Sheet for Patients:  EntrepreneurPulse.com.au  Fact Sheet for Healthcare Providers:  IncredibleEmployment.be  This test is no t yet approved or cleared by the [place] FDA and  has been authorized for detection and/or diagnosis of SARS-CoV-2 by FDA under an Emergency Use Authorization (EUA). This EUA will remain  in effect (meaning this test can be used) for the duration of the COVID-19 declaration under Section 564(b)(1) of the Act, 21 U.S.C.section 360bbb-3(b)(1), unless the authorization is terminated  or revoked sooner.       Influenza A by PCR NEGATIVE NEGATIVE Final   Influenza B by PCR NEGATIVE NEGATIVE Final    Comment: (NOTE) The Xpert Xpress SARS-CoV-2/FLU/RSV plus assay is intended as an aid in the diagnosis of influenza from Nasopharyngeal swab specimens and should not be used as a sole basis for treatment. Nasal washings and aspirates are unacceptable for  Xpert Xpress SARS-CoV-2/FLU/RSV testing.  Fact Sheet for Patients: EntrepreneurPulse.com.au  Fact Sheet for Healthcare Providers: IncredibleEmployment.be  This test is not yet approved or cleared by the [place] FDA and has been authorized for detection and/or diagnosis of SARS-CoV-2 by FDA under an Emergency Use Authorization (EUA). This EUA will remain in effect (meaning this test can be used) for the duration of the COVID-19 declaration under Section 564(b)(1) of the Act, 21 U.S.C. section 360bbb-3(b)(1), unless the authorization is terminated or revoked.  Performed at [facility], [address] [place], [place] [address]   MRSA Next Gen by PCR, Nasal     Status: None   Collection Time: 05/01/22 12:07 PM   Specimen: Nasal Mucosa; Nasal Swab  Result Value Ref Range Status   MRSA by PCR Next Gen NOT DETECTED NOT DETECTED Final    Comment: (  NOTE) The GeneXpert MRSA Assay (FDA approved for NASAL specimens only), is one component of a comprehensive MRSA colonization surveillance program. It is not intended to diagnose MRSA infection nor to guide or monitor treatment for MRSA infections. Test performance is not FDA approved in patients less than 86 years old. Performed at [facility], [place], [place] [address]   Respiratory (~20 pathogens) panel by PCR     Status: Abnormal   Collection Time: 05/02/22  1:53 AM   Specimen: Nasopharyngeal Swab; Respiratory  Result Value Ref Range Status   Adenovirus NOT DETECTED NOT DETECTED Final   Coronavirus 229E NOT DETECTED NOT DETECTED Final    Comment: (NOTE) The Coronavirus on the Respiratory Panel, DOES NOT test for the novel  Coronavirus (2019 nCoV)    Coronavirus HKU1 NOT DETECTED NOT DETECTED Final   Coronavirus NL63 NOT DETECTED NOT DETECTED Final   Coronavirus OC43 NOT DETECTED NOT DETECTED Final   Metapneumovirus NOT DETECTED NOT DETECTED Final    Rhinovirus / Enterovirus DETECTED (A) NOT DETECTED Final   Influenza A NOT DETECTED NOT DETECTED Final   Influenza B NOT DETECTED NOT DETECTED Final   Parainfluenza Virus 1 NOT DETECTED NOT DETECTED Final   Parainfluenza Virus 2 NOT DETECTED NOT DETECTED Final   Parainfluenza Virus 3 NOT DETECTED NOT DETECTED Final   Parainfluenza Virus 4 NOT DETECTED NOT DETECTED Final   Respiratory Syncytial Virus NOT DETECTED NOT DETECTED Final   Bordetella pertussis NOT DETECTED NOT DETECTED Final   Bordetella Parapertussis NOT DETECTED NOT DETECTED Final   Chlamydophila pneumoniae NOT DETECTED NOT DETECTED Final   Mycoplasma pneumoniae NOT DETECTED NOT DETECTED Final    Comment: Performed at [facility], [place] [place], [place] [address]  Expectorated Sputum Assessment w Gram Stain, Rflx to Resp Cult     Status: None   Collection Time: 05/03/22  9:45 PM   Specimen: Sputum  Result Value Ref Range Status   Specimen Description SPUTUM  Final   Special Requests NONE  Final   Sputum evaluation   Final    THIS SPECIMEN IS ACCEPTABLE FOR SPUTUM CULTURE Performed at [facility], [address], [place], [place] [address]    Report Status 05/04/2022 FINAL  Final  Culture, Respiratory w Gram Stain     Status: None (Preliminary result)   Collection Time: 05/03/22  9:45 PM   Specimen: SPU  Result Value Ref Range Status   Specimen Description   Final    SPUTUM Performed at [facility], [address] [place], [place] [address]    Special Requests   Final    NONE Reflexed from [id number] Performed at [facility], [address] [place], [place] [address]    Gram Stain   Final    RARE WBC PRESENT, PREDOMINANTLY PMN FEW BUDDING YEAST SEEN    Culture   Final    CULTURE REINCUBATED FOR BETTER GROWTH Performed at [facility], [place] [address] [place], [place] [address]    Report Status PENDING  Incomplete    Procedures and diagnostic studies:  US  Venous Img Lower Bilateral (DVT)  Result Date: 05/04/2022 CLINICAL DATA:  History of pulmonary embolism, on anticoagulation. Evaluate for DVT. EXAM: BILATERAL LOWER EXTREMITY VENOUS DOPPLER ULTRASOUND TECHNIQUE: Gray-scale sonography with graded compression, as well as color Doppler and duplex ultrasound were performed to evaluate the lower extremity deep venous systems from the level of the common femoral vein and including  the common femoral, femoral, profunda femoral, popliteal and calf veins including the posterior tibial, peroneal and gastrocnemius veins when visible. The superficial great saphenous vein was also interrogated. Spectral Doppler was utilized to evaluate flow at rest and with distal augmentation maneuvers in the common femoral, femoral and popliteal veins. COMPARISON:  Nuclear medicine perfusion lung scan-05/04/2022. FINDINGS: RIGHT LOWER EXTREMITY Common Femoral Vein: No evidence of thrombus. Normal compressibility, respiratory phasicity and response to augmentation. Saphenofemoral Junction: No evidence of thrombus. Normal compressibility and flow on color Doppler imaging. Profunda Femoral Vein: No evidence of thrombus. Normal compressibility and flow on color Doppler imaging. Femoral Vein: No evidence of thrombus. Normal compressibility, respiratory phasicity and response to augmentation. Popliteal Vein: No evidence of thrombus. Normal compressibility, respiratory phasicity and response to augmentation. Calf Veins: No evidence of thrombus. Normal compressibility and flow on color Doppler imaging. Superficial Great Saphenous Vein: No evidence of thrombus. Normal compressibility. Other Findings:  None. LEFT LOWER EXTREMITY Common Femoral Vein: No evidence of thrombus. Normal compressibility, respiratory phasicity and response to augmentation. Saphenofemoral Junction: No evidence of thrombus. Normal compressibility and flow on color Doppler imaging. Profunda Femoral Vein: No evidence of thrombus.  Normal compressibility and flow on color Doppler imaging. Femoral Vein: No evidence of thrombus. Normal compressibility, respiratory phasicity and response to augmentation. Popliteal Vein: No evidence of thrombus. Normal compressibility, respiratory phasicity and response to augmentation. Calf Veins: No evidence of thrombus. Normal compressibility and flow on color Doppler imaging. Superficial Great Saphenous Vein: No evidence of thrombus. Normal compressibility. Other Findings:  None. IMPRESSION: No evidence of DVT within either lower extremity. Electronically Signed   By: Sandi Mariscal M.D.   On: 05/04/2022 08:11               LOS: 4 days   Dreshawn Hendershott  [facility]   Pager on [email or web address]. If 7PM-7AM, please contact night-coverage at [email or web address]     05/05/2022, 12:57 PM

## 2022-05-06 DIAGNOSIS — J9601 Acute respiratory failure with hypoxia: Secondary | ICD-10-CM | POA: Diagnosis not present

## 2022-05-06 DIAGNOSIS — Z7189 Other specified counseling: Secondary | ICD-10-CM | POA: Diagnosis not present

## 2022-05-06 DIAGNOSIS — I5033 Acute on chronic diastolic (congestive) heart failure: Secondary | ICD-10-CM | POA: Diagnosis not present

## 2022-05-06 DIAGNOSIS — J189 Pneumonia, unspecified organism: Secondary | ICD-10-CM | POA: Diagnosis not present

## 2022-05-06 DIAGNOSIS — A419 Sepsis, unspecified organism: Secondary | ICD-10-CM | POA: Diagnosis not present

## 2022-05-06 LAB — GLUCOSE, CAPILLARY
Glucose-Capillary: 159 mg/dL — ABNORMAL HIGH (ref 70–99)
Glucose-Capillary: 197 mg/dL — ABNORMAL HIGH (ref 70–99)
Glucose-Capillary: 224 mg/dL — ABNORMAL HIGH (ref 70–99)
Glucose-Capillary: 196 mg/dL — ABNORMAL HIGH (ref 70–99)

## 2022-05-06 MED ORDER — SODIUM CHLORIDE 0.9% FLUSH: 3.0000 mL | INTRAVENOUS | Status: DC | PRN

## 2022-05-06 MED ORDER — BISACODYL 10 MG RE SUPP
10.0000 mg | Freq: Once | RECTAL | Status: AC
  Administered 2022-05-06: 10 mg via RECTAL
  Filled 2022-05-06: qty 1

## 2022-05-06 MED ORDER — HALOPERIDOL LACTATE 2 MG/ML PO CONC: 0.5000 mg | ORAL | Status: DC | PRN

## 2022-05-06 MED ORDER — ACETAMINOPHEN 325 MG PO TABS: 650.0000 mg | ORAL_TABLET | Freq: Four times a day (QID) | ORAL | Status: DC | PRN

## 2022-05-06 MED ORDER — HALOPERIDOL 0.5 MG PO TABS: 0.5000 mg | ORAL_TABLET | ORAL | Status: DC | PRN

## 2022-05-06 MED ORDER — HALOPERIDOL LACTATE 5 MG/ML IJ SOLN: 0.5000 mg | INTRAMUSCULAR | Status: DC | PRN

## 2022-05-06 MED ORDER — SODIUM CHLORIDE 0.9 % IV SOLN: 250.0000 mL | INTRAVENOUS | Status: DC | PRN

## 2022-05-06 MED ORDER — ONDANSETRON HCL 4 MG/2ML IJ SOLN: 4.0000 mg | Freq: Four times a day (QID) | INTRAMUSCULAR | Status: DC | PRN

## 2022-05-06 MED ORDER — LORAZEPAM 2 MG/ML PO CONC: 1.0000 mg | ORAL | Status: DC | PRN

## 2022-05-06 MED ORDER — GLYCOPYRROLATE 0.2 MG/ML IJ SOLN: 0.2000 mg | INTRAMUSCULAR | Status: DC | PRN

## 2022-05-06 MED ORDER — BIOTENE DRY MOUTH MT LIQD: 15.0000 mL | OROMUCOSAL | Status: DC | PRN

## 2022-05-06 MED ORDER — LORAZEPAM 1 MG PO TABS: 1.0000 mg | ORAL_TABLET | ORAL | Status: DC | PRN

## 2022-05-06 MED ORDER — POLYVINYL ALCOHOL 1.4 % OP SOLN: 1.0000 [drp] | Freq: Four times a day (QID) | OPHTHALMIC | Status: DC | PRN

## 2022-05-06 MED ORDER — MORPHINE SULFATE (CONCENTRATE) 10 MG/0.5ML PO SOLN
5.0000 mg | ORAL | Status: DC | PRN
  Filled 2022-05-06: qty 0.5

## 2022-05-06 MED ORDER — MORPHINE SULFATE (PF) 2 MG/ML IV SOLN
1.0000 mg | INTRAVENOUS | Status: DC | PRN
  Administered 2022-05-10: 1 mg via INTRAVENOUS
  Filled 2022-05-06: qty 1

## 2022-05-06 MED ORDER — ACETAMINOPHEN 650 MG RE SUPP: 650.0000 mg | Freq: Four times a day (QID) | RECTAL | Status: DC | PRN

## 2022-05-06 MED ORDER — GLYCOPYRROLATE 1 MG PO TABS: 1.0000 mg | ORAL_TABLET | ORAL | Status: DC | PRN

## 2022-05-06 MED ORDER — LORAZEPAM 2 MG/ML IJ SOLN
1.0000 mg | INTRAMUSCULAR | Status: DC | PRN
  Filled 2022-05-06: qty 1

## 2022-05-06 MED ORDER — SODIUM CHLORIDE 0.9% FLUSH
3.0000 mL | Freq: Two times a day (BID) | INTRAVENOUS | Status: DC
  Administered 2022-05-06 – 2022-05-10 (×10): 3 mL via INTRAVENOUS

## 2022-05-06 MED ORDER — MORPHINE SULFATE (CONCENTRATE) 10 MG/0.5ML PO SOLN: 5.0000 mg | ORAL | Status: DC | PRN

## 2022-05-06 MED ORDER — ONDANSETRON 4 MG PO TBDP: 4.0000 mg | ORAL_TABLET | Freq: Four times a day (QID) | ORAL | Status: DC | PRN

## 2022-05-06 NOTE — Progress Notes (Signed)
OT Cancellation Note  Patient Details Name: Dennis Serrano MRN: [id number] DOB: 08-19-1932   Cancelled Treatment:    Reason Eval/Treat Not Completed: Other (comment). Per palliative NP, therapy to complete orders at this time, pt going comfort care.   Ardeth Perfect., MPH, MS, OTR/L ascom [phone] 05/06/22, 10:54 AM

## 2022-05-06 NOTE — Progress Notes (Signed)
SLP Cancellation Note  Patient Details Name: Dennis Serrano MRN: [id number] DOB: 12/08/1932   Cancelled treatment:       Reason Eval/Treat Not Completed:  (chart reviewed.) Palliative Care consult obtained; completed. Pt is now moving to comfort care per [facility] note today. ST services can be available if any further needs.      Orinda Kenner, [place], CCC-SLP Speech Language Pathologist Rehab Services; [place] [phone] (ascom) Makeda Peeks 05/06/2022, 10:57 AM

## 2022-05-06 NOTE — TOC Progression Note (Signed)
Transition of Care [facility]) - Progression Note    Patient Details  Name: Dennis Serrano MRN: [id number] Date of Birth: September 02, 1932  Transition of Care [facility]) CM/SW [place], RN Phone Number: 05/06/2022, 3:10 PM  Clinical Narrative:    Spoke with the patient's daughter Dennis Serrano She had spoke to Henry from [facility] and understands that the patient is not meeting Hospice facility Criteria, She stated that they did also talk about Hospice following at home She stated that the patient lives with his son and he has his own health issues I explained that the patient is not rehab appropriate being comfort care, I explained to go to long term care that he would need a payor source. I explained Medicare does not cover Long term care I explained if they want him to go to a facility for long term care it would be private pay, she stated understanding, she stated that she will talk to [facility] and they will try to come up with a plan, I provided her with the weekend [facility] number   Expected Discharge Plan: [place] Barriers to Discharge: Continued Medical Work up  Expected Discharge Plan and Services Expected Discharge Plan: [place]                                               Social Determinants of Health (SDOH) Interventions    Readmission Risk Interventions     No data to display

## 2022-05-06 NOTE — Plan of Care (Signed)
  Problem: Activity: Goal: Ability to tolerate increased activity will improve Outcome: Progressing   Problem: Respiratory: Goal: Ability to maintain a clear airway will improve Outcome: Progressing   Problem: Clinical Measurements: Goal: Ability to maintain a body temperature in the normal range will improve Outcome: Progressing   Problem: Activity: Goal: Risk for activity intolerance will decrease Outcome: Progressing   Problem: Nutrition: Goal: Adequate nutrition will be maintained Outcome: Progressing   Problem: Pain Managment: Goal: General experience of comfort will improve Outcome: Progressing   Problem: Safety: Goal: Ability to remain free from injury will improve Outcome: Progressing   Problem: Skin Integrity: Goal: Risk for impaired skin integrity will decrease Outcome: Progressing

## 2022-05-06 NOTE — TOC Progression Note (Signed)
Transition of Care [facility]) - Progression Note    Patient Details  Name: Dennis Serrano MRN: [id number] Date of Birth: 09/28/1932  Transition of Care [facility]) CM/SW [place], RN Phone Number: 05/06/2022, 10:55 AM  Clinical Narrative:   TOC to continue to monitor for new needs, patient is now Comfort care     Expected Discharge Plan: [place] Barriers to Discharge: Continued Medical Work up  Expected Discharge Plan and Services Expected Discharge Plan: [place]                                               Social Determinants of Health (SDOH) Interventions    Readmission Risk Interventions     No data to display

## 2022-05-06 NOTE — Progress Notes (Signed)
PT Cancellation Note  Patient Details Name: Dennis Serrano MRN: [id number] DOB: 1933/04/22   Cancelled Treatment:    Reason Eval/Treat Not Completed: Other (comment).  PT order cancelled.  Pt now comfort care.  Leitha Bleak, PT 05/06/22, 1:13 PM

## 2022-05-06 NOTE — TOC Progression Note (Signed)
Transition of Care [facility]) - Progression Note    Patient Details  Name: Dennis Serrano MRN: [id number] Date of Birth: [birth year]  Transition of Care [facility]) CM/SW [place], RN Phone Number: 05/06/2022, 3:04 PM  Clinical Narrative:     Called the patients daughter [name], after Speaking to Hospice, they have determined that the patient does not meet criteria for the Hospice facility I left a general VM asking for a call back  Expected Discharge Plan: [place] Barriers to Discharge: Continued Medical Work up  Expected Discharge Plan and Services Expected Discharge Plan: [place]                                               Social Determinants of Health (SDOH) Interventions    Readmission Risk Interventions     No data to display

## 2022-05-06 NOTE — Progress Notes (Addendum)
Daily Progress Note   Patient Name: Dennis Serrano       Date: 05/06/2022 DOB: 1933-04-06  Age: 88 y.o. MRN#: [id number] Attending Physician: Jennye Boroughs, MD Primary Care Physician: Juline Patch, MD Admit Date: 05/16/2022  Reason for Consultation/Follow-up: Establishing goals of care  Subjective: Notes and labs reviewed.  In to see patient who is resting in bed with his eyes closed.  Met with patient's daughter and son as well as granddaughter in a family meeting.  Patient lives with son at baseline.  He discusses the events leading up to the hospitalization.  He discusses that his father has been speaking with people who are already gone for weeks now.  He states that he has discussed all of those to have already died in the family.  They discussed that he has voiced wondering when he would be able to leave this earth to be with family already in heaven.   We discussed his diagnoses, prognosis, GOC, EOL wishes disposition and options.  Created space and opportunity for patient  to explore thoughts and feelings regarding current medical information.   A detailed discussion was had today regarding advanced directives.  Concepts specific to code status, artifical feeding and hydration, IV antibiotics and rehospitalization were discussed.  The difference between an aggressive medical intervention path and a comfort care path was discussed.  Values and goals of care important to patient and family were attempted to be elicited.  Discussed limitations of medical interventions to prolong quality of life in some situations and discussed the concept of human mortality.  They discussed that patient has always done for others, and would not want a situation where others are having to provide  care for him.  They state he has not been happy with his quality of life since having his stroke, and will in no way be happy with his quality of life on the other side of this hospitalization.  The 3 together agreed they would like to shift patient to full comfort care and stop all life prolonging care.  Granddaughter who is the daughter of patient's son presented a legal guardianship form from the clerk of court's office listing both she and her aunt Dennis Serrano as patient's legal guardian.  Son agrees with this form.  The form was scanned to medical records and placed  in the chart.  They discussed concern for patient going home given the patient's son's health conditions, and previous experience with patient's son when their mother had died under hospice care.  They discussed that they would like [facility] hospice as this is where the daughter works, and we discussed that [facility] can speak further about arrangements.  Questions answered about comfort care and about providing oxygen for comfort and medications for the purpose of comfort.  Family would like to stop all life prolonging care.  Discussed that with stopping life-prolonging care, patient will likely soon need symptom management.  ADDENDUM: Message received to call daughter. Spoke with daughter on phone. Questions answered regarding symptom management, and medications are in place on PRN basis. PMT will follow up Monday if patient is still admitted.   Length of Stay: 5  Current Medications: Scheduled Meds:   amLODipine  5 mg Oral Daily   apixaban  10 mg Oral BID   Followed by   Derrill Memo ON 05/05/2022] apixaban  5 mg Oral BID   aspirin  81 mg Oral Daily   atorvastatin  40 mg Oral Daily   carbidopa-levodopa  1 tablet Oral TID   Chlorhexidine Gluconate Cloth  6 each Topical Daily   feeding supplement (NEPRO CARB STEADY)  237 mL Oral TID BM   haloperidol lactate  2 mg Intravenous Once   hydrALAZINE  25 mg Oral TID   insulin aspart  0-5  Units Subcutaneous QHS   insulin aspart  0-6 Units Subcutaneous TID WC   ipratropium-albuterol  3 mL Nebulization BID   metoprolol tartrate  25 mg Oral BID   multivitamin with minerals  1 tablet Oral Daily   tamsulosin  0.4 mg Oral Daily    Continuous Infusions:   PRN Meds: acetaminophen **OR** acetaminophen, haloperidol lactate, labetalol, ondansetron **OR** ondansetron (ZOFRAN) IV, oxyCODONE, polyethylene glycol, sodium chloride, traZODone  Physical Exam Pulmonary:     Effort: Pulmonary effort is normal.  Neurological:     Mental Status: He is alert.             Vital Signs: BP (!) 188/83 (BP Location: Left Arm)   Pulse 78   Temp 99 F (37.2 C) (Oral)   Resp 18   Ht _0  (1.753 m)   Wt 69.5 kg   SpO2 97%   BMI 22.63 kg/m  SpO2: SpO2: 97 % O2 Device: O2 Device: Nasal Cannula O2 Flow Rate: O2 Flow Rate (L/min): 2 L/min  Intake/output summary:  Intake/Output Summary (Last 24 hours) at 05/06/2022 1206 Last data filed at 05/06/2022 0900 Gross per 24 hour  Intake 480 ml  Output 1900 ml  Net -1420 ml   LBM: Last BM Date : 05/07/2022 Baseline Weight: Weight: 69.8 kg Most recent weight: Weight: 69.5 kg    Patient Active Problem List   Diagnosis Date Noted   Acute pulmonary embolism ([place]) 05/03/2022   Hyponatremia 05/03/2022   Acute on chronic diastolic CHF (congestive heart failure) ([place]) 05/03/2022   Delirium due to multiple etiologies, acute, hyperactive 05/02/2022   Acute hypoxemic respiratory failure ([place]) 05/01/2022   Alzheimer's disease ([place]) 05/01/2022   COPD with acute exacerbation ([place]) 05/01/2022   NSTEMI (non-ST elevated myocardial infarction) ([place]) 05/01/2022   AKI (acute kidney injury) ([place]) 05/01/2022   Known medical problems 05/01/2022   Goals of care, counseling/discussion 05/01/2022   Medication overdose 09/02/2018   TIA (transient ischemic attack) 10/20/2016   Parkinson's disease without dyskinesia, without mention of fluctuations 10/11/2016  Persistent atrial fibrillation ([place]) 10/11/2016   Controlled type 2 diabetes mellitus without complication, with long-term current use of insulin ([place]) 10/11/2016   Pressure injury of skin 10/07/2016   Severe sepsis ([place]) 10/02/2016   CAP (community acquired pneumonia) 10/02/2016   HTN (hypertension) 10/02/2016   HLD (hyperlipidemia) 10/02/2016   CAD (coronary artery disease) 10/02/2016   GERD (gastroesophageal reflux disease) 10/02/2016   Malignant neoplasm of prostate ([place]) 02/15/2011    Palliative Care Assessment & Plan     Recommendations/Plan: Family would like to shift to comfort focused care. Prognosis is less than 2 weeks. With stopping life-prolonging care, patient will likely soon need symptom management.   Code Status:    Code Status Orders  (From admission, onward)           Start     Ordered   05/01/22 1342  Do not attempt resuscitation (DNR)  Continuous       Question Answer Comment  In the event of cardiac or respiratory ARREST Do not call a "code blue"   In the event of cardiac or respiratory ARREST Do not perform Intubation, CPR, defibrillation or ACLS   In the event of cardiac or respiratory ARREST Use medication by any route, position, wound care, and other measures to relive pain and suffering. May use oxygen, suction and manual treatment of airway obstruction as needed for comfort.      05/01/22 1341           Code Status History     Date Active Date Inactive Code Status Order ID Comments User Context   05/01/2022 0102 05/01/2022 1341 Full Code [id number]  Clarnce Flock, MD ED   09/02/2018 2213 09/06/2018 2202 Full Code [id number]  Saundra Shelling, MD ED   10/20/2016 2336 10/21/2016 2132 Full Code [id number]  Nicholes Mango, MD Inpatient   10/02/2016 2324 10/08/2016 1631 Full Code [id number]  Lance Coon, MD Inpatient       Prognosis:  < 2 weeks  Thank you for allowing the Palliative Medicine Team to assist in the care of this  patient.  Asencion Gowda, NP  Please contact Palliative Medicine Team phone at [phone] for questions and concerns.

## 2022-05-06 NOTE — Progress Notes (Signed)
Progress Note    Dennis Serrano  NWG:[id number] DOB: [birth year]  DOA: 05/23/2022 PCP: Dennis Patch, MD      Brief Narrative:    Medical records reviewed and are as summarized below:   Dennis Serrano is a 88 y.o. male with medical history significant for CAD, Alzheimer's disease, hypertension, hyperlipidemia, Parkinson's, persistent A-fib, type 2 diabetes, COPD, who presented to the ED around 11 pm on 05/09/2022 for evaluation of worsening shortness of breath.    Patient was admitted for acute respiratory failure with hypoxia due to multifocal pneumonia.  He was started on IV antibiotics for working diagnosis of pneumonia, and IV heparin for possible ACS, with hs-troponin elevated (peaked above 11k) in the absence of chest pain.  Cardiology consulted.   11/5: on 12 L/min HFNC oxygen, otherwise stable, no CP  11/6: O2 needs improving, weaned 6 >> 4 L/min HFNC O2 this morning.  Delirium noted overnight.    11/7: remains on 5 L/min O2.  Stable for transfer to floor.        Assessment/Plan:   Principal Problem:   Acute hypoxemic respiratory failure (HCC) Active Problems:   Severe sepsis (HCC)   CAP (community acquired pneumonia)   CAD (coronary artery disease)   COPD with acute exacerbation (HCC)   NSTEMI (non-ST elevated myocardial infarction) ([place])   AKI (acute kidney injury) ([place])   Known medical problems   Goals of care, counseling/discussion   Delirium due to multiple etiologies, acute, hyperactive   Acute pulmonary embolism (HCC)   Hyponatremia   Acute on chronic diastolic CHF (congestive heart failure) ([place])    Body mass index is 22.63 kg/m.   Severe sepsis secondary to community-acquired multifocal Acute on chronic diastolic CHF Probable acute pulmonary embolism Acute hypoxic respiratory failure Delirium with underlying dementia CAD with coronary stenting COPD with acute exacerbation AKI on CKD stage IIIa Hypertension Parkinson's  disease Hyperlipidemia   PLAN  He completed Augmentin for severe sepsis and pneumonia Dennis Serrano, daughter who is a Merchandiser, retail met with Dennis Stade, NP with palliative care.  She has opted for comfort measures and wants patient to go to hospice house.  I also spoke with Dennis Serrano and she confirmed that she has decided on comfort measures with hospice.  All other medications except medications for comfort will be discontinued. Follow-up with social worker and hospice team to assist with disposition.    Diet Order             DIET - DYS 1 Room service appropriate? Yes with Assist; Fluid consistency: Thin  Diet effective now                            Consultants: Cardiologist  Procedures: None    Medications:    amLODipine  5 mg Oral Daily   carbidopa-levodopa  1 tablet Oral TID   Chlorhexidine Gluconate Cloth  6 each Topical Daily   ipratropium-albuterol  3 mL Nebulization BID   metoprolol tartrate  25 mg Oral BID   sodium chloride flush  3 mL Intravenous Q12H   tamsulosin  0.4 mg Oral Daily   Continuous Infusions:  sodium chloride        Anti-infectives (From admission, onward)    Start     Dose/Rate Route Frequency Ordered Stop   05/05/22 1315  amoxicillin-clavulanate (AUGMENTIN) 500-125 MG per tablet 1 tablet        1 tablet Oral 2 times daily 05/05/22  1303 05/06/22 1054   05/03/22 2200  amoxicillin-clavulanate (AUGMENTIN) 500-125 MG per tablet 1 tablet        1 tablet Oral 2 times daily 05/03/22 1231 05/04/22 2220   05/02/22 2200  azithromycin (ZITHROMAX) tablet 500 mg  Status:  Discontinued        500 mg Oral Daily at bedtime 05/02/22 0936 05/03/22 1230   05/01/22 0015  cefTRIAXone (ROCEPHIN) 2 g in sodium chloride 0.9 % 100 mL IVPB  Status:  Discontinued        2 g 200 mL/hr over 30 Minutes Intravenous Every 24 hours 05/01/22 0003 05/03/22 1230   05/01/22 0015  azithromycin (ZITHROMAX) 500 mg in sodium chloride 0.9 % 250 mL IVPB  Status:  Discontinued         500 mg 250 mL/hr over 60 Minutes Intravenous Every 24 hours 05/01/22 0003 05/02/22 0935              Family Communication/Anticipated D/C date and plan/Code Status   DVT prophylaxis:      Code Status: DNR  Family Communication: Plan discussed with Dennis Serrano, daughter, over the phone Disposition Plan: Plan to discharge to SNF   Status is: Inpatient Remains inpatient appropriate because: Comfort measures awaiting disposition to hospice house       Subjective:   Interval events noted.  He is confused and cannot provide any history.  He said he feels fine.  He has a sitter at the bedside  Objective:    Vitals:   05/05/22 2358 05/06/22 0431 05/06/22 0637 05/06/22 0743  BP:   (!) 185/81 (!) 188/83  Pulse:   76 78  Resp:   18 18  Temp:   98.1 F (36.7 C) 99 F (37.2 C)  TempSrc:   Oral Oral  SpO2: 96%  96% 97%  Weight:  69.5 kg    Height:       No data found.   Intake/Output Summary (Last 24 hours) at 05/06/2022 1311 Last data filed at 05/06/2022 0900 Gross per 24 hour  Intake 240 ml  Output 1900 ml  Net -1660 ml   Filed Weights   05/01/22 1209 05/04/22 0500 05/06/22 0431  Weight: 68.9 kg 69.9 kg 69.5 kg    Exam:  GEN: NAD SKIN: Warm and dry EYES: No pallor or icterus ENT: MMM CV: RRR PULM: Coarse breath sounds bilaterally, no wheezing ABD: soft, ND, NT, +BS CNS: Alert but disoriented, non focal EXT: No edema or tenderness       Data Reviewed:   I have personally reviewed following labs and imaging studies:  Labs: Labs show the following:   Basic Metabolic Panel: Recent Labs  Lab 05/15/2022 2326 05/01/22 0223 05/02/22 0228 05/03/22 0441 05/03/22 1620 05/04/22 0538 05/05/22 0512  NA 134*   < > 133* 132* 131* 131* 136  K 5.3*   < > 4.9 4.5 5.0 4.6 4.5  CL 101   < > 104 103 103 103 108  CO2 20*   < > 17* 17* 17* 19* 20*  GLUCOSE 223*   < > 199* 206* 253* 200* 186*  BUN 42*   < > 58* 80* 89* 93* 95*  CREATININE 2.20*    < > 2.24* 2.59* 2.88* 2.80* 2.60*  CALCIUM 9.0   < > 8.5* 8.4* 8.7* 8.3* 8.5*  MG 1.7  --  1.9 2.1  --  2.1 2.2  PHOS  --   --   --  4.3  --   --   --    < > =  values in this interval not displayed.   GFR Estimated Creatinine Clearance: 19.7 mL/min (A) (by C-G formula based on SCr of 2.6 mg/dL (H)). Liver Function Tests: Recent Labs  Lab 05/22/2022 2326 05/01/22 0223  AST 91* 82*  ALT <5 8  ALKPHOS 117 106  BILITOT 0.4 0.4  PROT 7.5 7.0  ALBUMIN 3.8 3.3*   No results for input(s): "LIPASE", "AMYLASE" in the last 168 hours. No results for input(s): "AMMONIA" in the last 168 hours. Coagulation profile No results for input(s): "INR", "PROTIME" in the last 168 hours.  CBC: Recent Labs  Lab 04/27/2022 2326 05/01/22 0223 05/02/22 0628 05/03/22 0441 05/04/22 0538 05/05/22 0512  WBC 13.6* 11.3* 13.0* 10.1 9.2 9.2  NEUTROABS 12.1*  --   --   --   --   --   HGB 11.1* 10.4* 9.2* 8.7* 8.7* 8.9*  HCT 34.7* 32.5* 27.8* 25.9* 25.5* 26.5*  MCV 87.6 87.4 85.0 83.5 83.6 83.9  PLT 263 285 262 268 276 302   Cardiac Enzymes: No results for input(s): "CKTOTAL", "CKMB", "CKMBINDEX", "TROPONINI" in the last 168 hours. BNP (last 3 results) No results for input(s): "PROBNP" in the last 8760 hours. CBG: Recent Labs  Lab 05/05/22 1124 05/05/22 1613 05/05/22 2120 05/06/22 0739 05/06/22 1154  GLUCAP 177* 242* 222* 159* 197*   D-Dimer: No results for input(s): "DDIMER" in the last 72 hours. Hgb A1c: No results for input(s): "HGBA1C" in the last 72 hours. Lipid Profile: Recent Labs    05/04/22 0538  CHOL 160  HDL 37*  LDLCALC 103*  TRIG 99  CHOLHDL 4.3   Thyroid function studies: No results for input(s): "TSH", "T4TOTAL", "T3FREE", "THYROIDAB" in the last 72 hours.  Invalid input(s): "FREET3" Anemia work up: No results for input(s): "VITAMINB12", "FOLATE", "FERRITIN", "TIBC", "IRON", "RETICCTPCT" in the last 72 hours. Sepsis Labs: Recent Labs  Lab 05/16/2022 2326  05/01/22 0223 05/01/22 1437 05/02/22 0628 05/03/22 0441 05/03/22 1620 05/04/22 0538 05/05/22 0512  PROCALCITON 0.38  --   --   --   --  0.16  --   --   WBC 13.6*   < >  --  13.0* 10.1  --  9.2 9.2  LATICACIDVEN 3.6*  --  1.8  --   --   --   --   --    < > = values in this interval not displayed.    Microbiology Recent Results (from the past 240 hour(s))  Culture, blood (routine x 2)     Status: None   Collection Time: 05/25/2022 11:26 PM   Specimen: BLOOD  Result Value Ref Range Status   Specimen Description BLOOD BLOOD LEFT HAND  Final   Special Requests   Final    BOTTLES DRAWN AEROBIC AND ANAEROBIC Blood Culture adequate volume   Culture   Final    NO GROWTH 5 DAYS Performed at [facility], [address], [place], [place] [address]    Report Status 05/05/2022 FINAL  Final  Culture, blood (routine x 2)     Status: None   Collection Time: 05/09/2022 11:26 PM   Specimen: BLOOD  Result Value Ref Range Status   Specimen Description BLOOD BLOOD RIGHT ARM  Final   Special Requests   Final    BOTTLES DRAWN AEROBIC AND ANAEROBIC Blood Culture adequate volume   Culture   Final    NO GROWTH 5 DAYS Performed at [facility], [address], [place],  [address]    Report Status  05/05/2022 FINAL  Final  Resp Panel by RT-PCR (Flu A&B, Covid) Anterior Nasal Swab     Status: None   Collection Time: 05/12/2022 11:26 PM   Specimen: Anterior Nasal Swab  Result Value Ref Range Status   SARS Coronavirus 2 by RT PCR NEGATIVE NEGATIVE Final    Comment: (NOTE) SARS-CoV-2 target nucleic acids are NOT DETECTED.  The SARS-CoV-2 RNA is generally detectable in upper respiratory specimens during the acute phase of infection. The lowest concentration of SARS-CoV-2 viral copies this assay can detect is 138 copies/mL. A negative result does not preclude SARS-Cov-2 infection and should not be used as the sole basis for treatment or other patient management decisions. A  negative result may occur with  improper specimen collection/handling, submission of specimen other than nasopharyngeal swab, presence of viral mutation(s) within the areas targeted by this assay, and inadequate number of viral copies(<138 copies/mL). A negative result must be combined with clinical observations, patient history, and epidemiological information. The expected result is Negative.  Fact Sheet for Patients:  EntrepreneurPulse.com.au  Fact Sheet for Healthcare Providers:  IncredibleEmployment.be  This test is no t yet approved or cleared by the [place] FDA and  has been authorized for detection and/or diagnosis of SARS-CoV-2 by FDA under an Emergency Use Authorization (EUA). This EUA will remain  in effect (meaning this test can be used) for the duration of the COVID-19 declaration under Section 564(b)(1) of the Act, 21 U.S.C.section 360bbb-3(b)(1), unless the authorization is terminated  or revoked sooner.       Influenza A by PCR NEGATIVE NEGATIVE Final   Influenza B by PCR NEGATIVE NEGATIVE Final    Comment: (NOTE) The Xpert Xpress SARS-CoV-2/FLU/RSV plus assay is intended as an aid in the diagnosis of influenza from Nasopharyngeal swab specimens and should not be used as a sole basis for treatment. Nasal washings and aspirates are unacceptable for Xpert Xpress SARS-CoV-2/FLU/RSV testing.  Fact Sheet for Patients: EntrepreneurPulse.com.au  Fact Sheet for Healthcare Providers: IncredibleEmployment.be  This test is not yet approved or cleared by the [place] FDA and has been authorized for detection and/or diagnosis of SARS-CoV-2 by FDA under an Emergency Use Authorization (EUA). This EUA will remain in effect (meaning this test can be used) for the duration of the COVID-19 declaration under Section 564(b)(1) of the Act, 21 U.S.C. section 360bbb-3(b)(1), unless the authorization  is terminated or revoked.  Performed at [facility], [address] [place], [place] [address]   MRSA Next Gen by PCR, Nasal     Status: None   Collection Time: 05/01/22 12:07 PM   Specimen: Nasal Mucosa; Nasal Swab  Result Value Ref Range Status   MRSA by PCR Next Gen NOT DETECTED NOT DETECTED Final    Comment: (NOTE) The GeneXpert MRSA Assay (FDA approved for NASAL specimens only), is one component of a comprehensive MRSA colonization surveillance program. It is not intended to diagnose MRSA infection nor to guide or monitor treatment for MRSA infections. Test performance is not FDA approved in patients less than 4 years old. Performed at [facility], [place], [place] [address]   Respiratory (~20 pathogens) panel by PCR     Status: Abnormal   Collection Time: 05/02/22  1:53 AM   Specimen: Nasopharyngeal Swab; Respiratory  Result Value Ref Range Status   Adenovirus NOT DETECTED NOT DETECTED Final   Coronavirus 229E NOT DETECTED NOT DETECTED Final    Comment: (NOTE) The Coronavirus on the Respiratory Panel, DOES  NOT test for the novel  Coronavirus (2019 nCoV)    Coronavirus HKU1 NOT DETECTED NOT DETECTED Final   Coronavirus NL63 NOT DETECTED NOT DETECTED Final   Coronavirus OC43 NOT DETECTED NOT DETECTED Final   Metapneumovirus NOT DETECTED NOT DETECTED Final   Rhinovirus / Enterovirus DETECTED (A) NOT DETECTED Final   Influenza A NOT DETECTED NOT DETECTED Final   Influenza B NOT DETECTED NOT DETECTED Final   Parainfluenza Virus 1 NOT DETECTED NOT DETECTED Final   Parainfluenza Virus 2 NOT DETECTED NOT DETECTED Final   Parainfluenza Virus 3 NOT DETECTED NOT DETECTED Final   Parainfluenza Virus 4 NOT DETECTED NOT DETECTED Final   Respiratory Syncytial Virus NOT DETECTED NOT DETECTED Final   Bordetella pertussis NOT DETECTED NOT DETECTED Final   Bordetella Parapertussis NOT DETECTED NOT DETECTED Final   Chlamydophila pneumoniae NOT  DETECTED NOT DETECTED Final   Mycoplasma pneumoniae NOT DETECTED NOT DETECTED Final    Comment: Performed at [facility], [place] [address] [place], [place] [address]  Expectorated Sputum Assessment w Gram Stain, Rflx to Resp Cult     Status: None   Collection Time: 05/03/22  9:45 PM   Specimen: Sputum  Result Value Ref Range Status   Specimen Description SPUTUM  Final   Special Requests NONE  Final   Sputum evaluation   Final    THIS SPECIMEN IS ACCEPTABLE FOR SPUTUM CULTURE Performed at [facility], [address] [place], [place] [address]    Report Status 05/04/2022 FINAL  Final  Culture, Respiratory w Gram Stain     Status: None (Preliminary result)   Collection Time: 05/03/22  9:45 PM   Specimen: SPU  Result Value Ref Range Status   Specimen Description   Final    SPUTUM Performed at [facility], [address], [place], [place] [address]    Special Requests   Final    NONE Reflexed from [id number] Performed at [facility], [address] [place], [place] [address]    Gram Stain   Final    RARE WBC PRESENT, PREDOMINANTLY PMN FEW BUDDING YEAST SEEN    Culture   Final    FEW YEAST IDENTIFICATION TO FOLLOW Performed at [facility], [place] [address] [place], [place] [address]    Report Status PENDING  Incomplete    Procedures and diagnostic studies:  No results found.             LOS: 5 days   Elizibeth Breau  [facility]   Pager on [email or web address]. If 7PM-7AM, please contact night-coverage at [email or web address]     05/06/2022, 1:11 PM

## 2022-05-06 NOTE — Progress Notes (Signed)
[facility]) Hospital Liaison Referral Note:   Referral received from [place] RN for Hospice services. Met patient at bedside and called daughter [name] and granddaughter Rojelio Brenner to explain hospice philosophy and services.  Per [facility] MD, Dr. Karie Georges, patient is currently not eligible for [place].    Family and hospital TOC updated.    [facility] will continue to follow for discharge disposition.    Thank you   Kenna Gilbert BSN RN  [facility] Liaison  [phone]

## 2022-05-07 DIAGNOSIS — I2699 Other pulmonary embolism without acute cor pulmonale: Secondary | ICD-10-CM | POA: Diagnosis not present

## 2022-05-07 DIAGNOSIS — I5033 Acute on chronic diastolic (congestive) heart failure: Secondary | ICD-10-CM | POA: Diagnosis not present

## 2022-05-07 DIAGNOSIS — J9601 Acute respiratory failure with hypoxia: Secondary | ICD-10-CM | POA: Diagnosis not present

## 2022-05-07 LAB — CULTURE, RESPIRATORY W GRAM STAIN

## 2022-05-07 LAB — GLUCOSE, CAPILLARY
Glucose-Capillary: 128 mg/dL — ABNORMAL HIGH (ref 70–99)
Glucose-Capillary: 206 mg/dL — ABNORMAL HIGH (ref 70–99)

## 2022-05-07 MED ORDER — IPRATROPIUM-ALBUTEROL 0.5-2.5 (3) MG/3ML IN SOLN: 3.0000 mL | RESPIRATORY_TRACT | Status: DC | PRN

## 2022-05-07 NOTE — Progress Notes (Signed)
Progress Note    Dennis Serrano  RJJ:[id number] DOB: June 25, 1933  DOA: 05/20/2022 PCP: Juline Patch, MD      Brief Narrative:    Medical records reviewed and are as summarized below:   Dennis Serrano is a 88 y.o. male with medical history significant for CAD, Alzheimer's disease, hypertension, hyperlipidemia, Parkinson's, persistent A-fib, type 2 diabetes, COPD, who presented to the ED around 11 pm on 04/27/2022 for evaluation of worsening shortness of breath.    Patient was admitted for acute respiratory failure with hypoxia due to multifocal pneumonia.  He was started on IV antibiotics for working diagnosis of pneumonia, and IV heparin for possible ACS, with hs-troponin elevated (peaked above 11k) in the absence of chest pain.  Cardiology consulted.   11/5: on 12 L/min HFNC oxygen, otherwise stable, no CP  11/6: O2 needs improving, weaned 6 >> 4 L/min HFNC O2 this morning.  Delirium noted overnight.    11/7: remains on 5 L/min O2.  Stable for transfer to floor.        Assessment/Plan:   Principal Problem:   Acute hypoxemic respiratory failure (HCC) Active Problems:   Severe sepsis (HCC)   CAP (community acquired pneumonia)   CAD (coronary artery disease)   COPD with acute exacerbation (HCC)   NSTEMI (non-ST elevated myocardial infarction) ([place])   AKI (acute kidney injury) ([place])   Known medical problems   Goals of care, counseling/discussion   Delirium due to multiple etiologies, acute, hyperactive   Acute pulmonary embolism (HCC)   Hyponatremia   Acute on chronic diastolic CHF (congestive heart failure) ([place])    Body mass index is 22.63 kg/m.   Severe sepsis secondary to community-acquired multifocal Acute on chronic diastolic CHF Probable acute pulmonary embolism Acute hypoxic respiratory failure Delirium with underlying dementia CAD with coronary stenting COPD with acute exacerbation AKI on CKD stage IIIa Hypertension Parkinson's  disease Hyperlipidemia   PLAN  Continue comfort measures Patient is not a candidate for discharge to the hospice facility per hospice team. Follow-up with social worker to assist with disposition    Diet Order             DIET - DYS 1 Room service appropriate? Yes with Assist; Fluid consistency: Thin  Diet effective now                            Consultants: Cardiologist  Procedures: None    Medications:    Chlorhexidine Gluconate Cloth  6 each Topical Daily   sodium chloride flush  3 mL Intravenous Q12H   Continuous Infusions:  sodium chloride        Anti-infectives (From admission, onward)    Start     Dose/Rate Route Frequency Ordered Stop   05/05/22 1315  amoxicillin-clavulanate (AUGMENTIN) 500-125 MG per tablet 1 tablet        1 tablet Oral 2 times daily 05/05/22 1303 05/06/22 1054   05/03/22 2200  amoxicillin-clavulanate (AUGMENTIN) 500-125 MG per tablet 1 tablet        1 tablet Oral 2 times daily 05/03/22 1231 05/04/22 2220   05/02/22 2200  azithromycin (ZITHROMAX) tablet 500 mg  Status:  Discontinued        500 mg Oral Daily at bedtime 05/02/22 0936 05/03/22 1230   05/01/22 0015  cefTRIAXone (ROCEPHIN) 2 g in sodium chloride 0.9 % 100 mL IVPB  Status:  Discontinued        2  g 200 mL/hr over 30 Minutes Intravenous Every 24 hours 05/01/22 0003 05/03/22 1230   05/01/22 0015  azithromycin (ZITHROMAX) 500 mg in sodium chloride 0.9 % 250 mL IVPB  Status:  Discontinued        500 mg 250 mL/hr over 60 Minutes Intravenous Every 24 hours 05/01/22 0003 05/02/22 0935              Family Communication/Anticipated D/C date and plan/Code Status   DVT prophylaxis:      Code Status: DNR  Family Communication: None Disposition Plan: Plan to discharge to SNF   Status is: Inpatient Remains inpatient appropriate because: Comfort measures awaiting disposition to hospice house       Subjective:   Interval events noted. He is  confused and cannot provide any history  Objective:    Vitals:   05/06/22 1547 05/06/22 2021 05/06/22 2145 05/07/22 0700  BP: (!) 180/68  (!) 169/66 (!) 194/79  Pulse: 76  87 91  Resp: '18  20 19  '$ Temp: 98.2 F (36.8 C)  98.9 F (37.2 C) 99.3 F (37.4 C)  TempSrc:      SpO2: 99% 95% 94% 97%  Weight:      Height:       No data found.   Intake/Output Summary (Last 24 hours) at 05/07/2022 1301 Last data filed at 05/07/2022 0803 Gross per 24 hour  Intake --  Output 2700 ml  Net -2700 ml   Filed Weights   05/01/22 1209 05/04/22 0500 05/06/22 0431  Weight: 68.9 kg 69.9 kg 69.5 kg    Exam:   GEN: NAD SKIN: Warm and dry EYES: EOMI ENT: MMM CV: RRR PULM: Coarse breath sounds, ABD: soft, ND, NT, +BS CNS: Alert, disoriented, non focal EXT: No edema or tenderness    Data Reviewed:   I have personally reviewed following labs and imaging studies:  Labs: Labs show the following:   Basic Metabolic Panel: Recent Labs  Lab 05/18/2022 2326 05/01/22 0223 05/02/22 0228 05/03/22 0441 05/03/22 1620 05/04/22 0538 05/05/22 0512  NA 134*   < > 133* 132* 131* 131* 136  K 5.3*   < > 4.9 4.5 5.0 4.6 4.5  CL 101   < > 104 103 103 103 108  CO2 20*   < > 17* 17* 17* 19* 20*  GLUCOSE 223*   < > 199* 206* 253* 200* 186*  BUN 42*   < > 58* 80* 89* 93* 95*  CREATININE 2.20*   < > 2.24* 2.59* 2.88* 2.80* 2.60*  CALCIUM 9.0   < > 8.5* 8.4* 8.7* 8.3* 8.5*  MG 1.7  --  1.9 2.1  --  2.1 2.2  PHOS  --   --   --  4.3  --   --   --    < > = values in this interval not displayed.   GFR Estimated Creatinine Clearance: 19.7 mL/min (A) (by C-G formula based on SCr of 2.6 mg/dL (H)). Liver Function Tests: Recent Labs  Lab 05/05/2022 2326 05/01/22 0223  AST 91* 82*  ALT <5 8  ALKPHOS 117 106  BILITOT 0.4 0.4  PROT 7.5 7.0  ALBUMIN 3.8 3.3*   No results for input(s): "LIPASE", "AMYLASE" in the last 168 hours. No results for input(s): "AMMONIA" in the last 168 hours. Coagulation  profile No results for input(s): "INR", "PROTIME" in the last 168 hours.  CBC: Recent Labs  Lab 05/21/2022 2326 05/01/22 0223 05/02/22 1610 05/03/22 0441 05/04/22 0538 05/05/22  0512  WBC 13.6* 11.3* 13.0* 10.1 9.2 9.2  NEUTROABS 12.1*  --   --   --   --   --   HGB 11.1* 10.4* 9.2* 8.7* 8.7* 8.9*  HCT 34.7* 32.5* 27.8* 25.9* 25.5* 26.5*  MCV 87.6 87.4 85.0 83.5 83.6 83.9  PLT 263 285 262 268 276 302   Cardiac Enzymes: No results for input(s): "CKTOTAL", "CKMB", "CKMBINDEX", "TROPONINI" in the last 168 hours. BNP (last 3 results) No results for input(s): "PROBNP" in the last 8760 hours. CBG: Recent Labs  Lab 05/06/22 1154 05/06/22 1543 05/06/22 2142 05/07/22 0746 05/07/22 1234  GLUCAP 197* 224* 196* 128* 206*   D-Dimer: No results for input(s): "DDIMER" in the last 72 hours. Hgb A1c: No results for input(s): "HGBA1C" in the last 72 hours. Lipid Profile: No results for input(s): "CHOL", "HDL", "LDLCALC", "TRIG", "CHOLHDL", "LDLDIRECT" in the last 72 hours.  Thyroid function studies: No results for input(s): "TSH", "T4TOTAL", "T3FREE", "THYROIDAB" in the last 72 hours.  Invalid input(s): "FREET3" Anemia work up: No results for input(s): "VITAMINB12", "FOLATE", "FERRITIN", "TIBC", "IRON", "RETICCTPCT" in the last 72 hours. Sepsis Labs: Recent Labs  Lab 05/15/2022 2326 05/01/22 0223 05/01/22 1437 05/02/22 0628 05/03/22 0441 05/03/22 1620 05/04/22 0538 05/05/22 0512  PROCALCITON 0.38  --   --   --   --  0.16  --   --   WBC 13.6*   < >  --  13.0* 10.1  --  9.2 9.2  LATICACIDVEN 3.6*  --  1.8  --   --   --   --   --    < > = values in this interval not displayed.    Microbiology Recent Results (from the past 240 hour(s))  Culture, blood (routine x 2)     Status: None   Collection Time: 05/07/2022 11:26 PM   Specimen: BLOOD  Result Value Ref Range Status   Specimen Description BLOOD BLOOD LEFT HAND  Final   Special Requests   Final    BOTTLES DRAWN AEROBIC AND  ANAEROBIC Blood Culture adequate volume   Culture   Final    NO GROWTH 5 DAYS Performed at [facility], [address] [place], [place] [address]    Report Status 05/05/2022 FINAL  Final  Culture, blood (routine x 2)     Status: None   Collection Time: 05/25/2022 11:26 PM   Specimen: BLOOD  Result Value Ref Range Status   Specimen Description BLOOD BLOOD RIGHT ARM  Final   Special Requests   Final    BOTTLES DRAWN AEROBIC AND ANAEROBIC Blood Culture adequate volume   Culture   Final    NO GROWTH 5 DAYS Performed at [facility], [address], [place], [place] [address]    Report Status 05/05/2022 FINAL  Final  Resp Panel by RT-PCR (Flu A&B, Covid) Anterior Nasal Swab     Status: None   Collection Time: 05/09/2022 11:26 PM   Specimen: Anterior Nasal Swab  Result Value Ref Range Status   SARS Coronavirus 2 by RT PCR NEGATIVE NEGATIVE Final    Comment: (NOTE) SARS-CoV-2 target nucleic acids are NOT DETECTED.  The SARS-CoV-2 RNA is generally detectable in upper respiratory specimens during the acute phase of infection. The lowest concentration of SARS-CoV-2 viral copies this assay can detect is 138 copies/mL. A negative result does not preclude SARS-Cov-2 infection and should not be used as the sole basis for treatment or other patient management decisions. A negative result  may occur with  improper specimen collection/handling, submission of specimen other than nasopharyngeal swab, presence of viral mutation(s) within the areas targeted by this assay, and inadequate number of viral copies(<138 copies/mL). A negative result must be combined with clinical observations, patient history, and epidemiological information. The expected result is Negative.  Fact Sheet for Patients:  EntrepreneurPulse.com.au  Fact Sheet for Healthcare Providers:  IncredibleEmployment.be  This test is no t yet approved or cleared by the [place] FDA and  has been authorized for detection and/or diagnosis of SARS-CoV-2 by FDA under an Emergency Use Authorization (EUA). This EUA will remain  in effect (meaning this test can be used) for the duration of the COVID-19 declaration under Section 564(b)(1) of the Act, 21 U.S.C.section 360bbb-3(b)(1), unless the authorization is terminated  or revoked sooner.       Influenza A by PCR NEGATIVE NEGATIVE Final   Influenza B by PCR NEGATIVE NEGATIVE Final    Comment: (NOTE) The Xpert Xpress SARS-CoV-2/FLU/RSV plus assay is intended as an aid in the diagnosis of influenza from Nasopharyngeal swab specimens and should not be used as a sole basis for treatment. Nasal washings and aspirates are unacceptable for Xpert Xpress SARS-CoV-2/FLU/RSV testing.  Fact Sheet for Patients: EntrepreneurPulse.com.au  Fact Sheet for Healthcare Providers: IncredibleEmployment.be  This test is not yet approved or cleared by the [place] FDA and has been authorized for detection and/or diagnosis of SARS-CoV-2 by FDA under an Emergency Use Authorization (EUA). This EUA will remain in effect (meaning this test can be used) for the duration of the COVID-19 declaration under Section 564(b)(1) of the Act, 21 U.S.C. section 360bbb-3(b)(1), unless the authorization is terminated or revoked.  Performed at [facility], [address] [place], [place] [address]   MRSA Next Gen by PCR, Nasal     Status: None   Collection Time: 05/01/22 12:07 PM   Specimen: Nasal Mucosa; Nasal Swab  Result Value Ref Range Status   MRSA by PCR Next Gen NOT DETECTED NOT DETECTED Final    Comment: (NOTE) The GeneXpert MRSA Assay (FDA approved for NASAL specimens only), is one component of a comprehensive MRSA colonization surveillance program. It is not intended to diagnose MRSA infection nor to guide or monitor treatment for MRSA infections. Test performance is not FDA  approved in patients less than 60 years old. Performed at [facility], [place], [place] [address]   Respiratory (~20 pathogens) panel by PCR     Status: Abnormal   Collection Time: 05/02/22  1:53 AM   Specimen: Nasopharyngeal Swab; Respiratory  Result Value Ref Range Status   Adenovirus NOT DETECTED NOT DETECTED Final   Coronavirus 229E NOT DETECTED NOT DETECTED Final    Comment: (NOTE) The Coronavirus on the Respiratory Panel, DOES NOT test for the novel  Coronavirus (2019 nCoV)    Coronavirus HKU1 NOT DETECTED NOT DETECTED Final   Coronavirus NL63 NOT DETECTED NOT DETECTED Final   Coronavirus OC43 NOT DETECTED NOT DETECTED Final   Metapneumovirus NOT DETECTED NOT DETECTED Final   Rhinovirus / Enterovirus DETECTED (A) NOT DETECTED Final   Influenza A NOT DETECTED NOT DETECTED Final   Influenza B NOT DETECTED NOT DETECTED Final   Parainfluenza Virus 1 NOT DETECTED NOT DETECTED Final   Parainfluenza Virus 2 NOT DETECTED NOT DETECTED Final   Parainfluenza Virus 3 NOT DETECTED NOT DETECTED Final   Parainfluenza Virus 4 NOT DETECTED NOT DETECTED Final   Respiratory Syncytial Virus NOT DETECTED NOT  DETECTED Final   Bordetella pertussis NOT DETECTED NOT DETECTED Final   Bordetella Parapertussis NOT DETECTED NOT DETECTED Final   Chlamydophila pneumoniae NOT DETECTED NOT DETECTED Final   Mycoplasma pneumoniae NOT DETECTED NOT DETECTED Final    Comment: Performed at [facility], [place] [address] [place], [place] [address]  Expectorated Sputum Assessment w Gram Stain, Rflx to Resp Cult     Status: None   Collection Time: 05/03/22  9:45 PM   Specimen: Sputum  Result Value Ref Range Status   Specimen Description SPUTUM  Final   Special Requests NONE  Final   Sputum evaluation   Final    THIS SPECIMEN IS ACCEPTABLE FOR SPUTUM CULTURE Performed at [facility], [address] [place], [place] [address]    Report Status 05/04/2022 FINAL  Final   Culture, Respiratory w Gram Stain     Status: None   Collection Time: 05/03/22  9:45 PM   Specimen: SPU  Result Value Ref Range Status   Specimen Description   Final    SPUTUM Performed at [facility], [address] [place], [place] [address]    Special Requests   Final    NONE Reflexed from [id number] Performed at [facility], [address] [place], [place] [address]    Gram Stain   Final    RARE WBC PRESENT, PREDOMINANTLY PMN FEW BUDDING YEAST SEEN    Culture   Final    FEW CANDIDA ALBICANS NO STAPHYLOCOCCUS AUREUS ISOLATED No Pseudomonas species isolated Performed at [facility], [place] [address], [place], [place] [address]    Report Status 05/07/2022 FINAL  Final    Procedures and diagnostic studies:  No results found.             LOS: 6 days   Dennis Serrano  [facility]   Pager on [email or web address]. If 7PM-7AM, please contact night-coverage at [email or web address]     05/07/2022, 1:01 PM

## 2022-05-08 DIAGNOSIS — I2699 Other pulmonary embolism without acute cor pulmonale: Secondary | ICD-10-CM | POA: Diagnosis not present

## 2022-05-08 DIAGNOSIS — R6520 Severe sepsis without septic shock: Secondary | ICD-10-CM | POA: Diagnosis not present

## 2022-05-08 DIAGNOSIS — A419 Sepsis, unspecified organism: Secondary | ICD-10-CM | POA: Diagnosis not present

## 2022-05-08 DIAGNOSIS — J9601 Acute respiratory failure with hypoxia: Secondary | ICD-10-CM | POA: Diagnosis not present

## 2022-05-08 NOTE — Plan of Care (Signed)
  Problem: Respiratory: Goal: Ability to maintain a clear airway will improve Outcome: Progressing   Problem: Clinical Measurements: Goal: Will remain free from infection Outcome: Progressing   Problem: Nutrition: Goal: Adequate nutrition will be maintained Outcome: Progressing   Problem: Coping: Goal: Level of anxiety will decrease Outcome: Progressing   Problem: Pain Managment: Goal: General experience of comfort will improve Outcome: Progressing   Problem: Safety: Goal: Ability to remain free from injury will improve Outcome: Progressing

## 2022-05-08 NOTE — Progress Notes (Signed)
Progress Note    [name]  EFE:[id number] DOB: August 08, 1932  DOA: 05/02/2022 PCP: Juline Patch, MD      Brief Narrative:    Medical records reviewed and are as summarized below:   Dennis Serrano is a 88 y.o. male with medical history significant for CAD, Alzheimer's disease, hypertension, hyperlipidemia, Parkinson's, persistent A-fib, type 2 diabetes, COPD, who presented to the ED around 11 pm on 05/04/2022 for evaluation of worsening shortness of breath.    Patient was admitted for acute respiratory failure with hypoxia due to multifocal pneumonia.  He was started on IV antibiotics for working diagnosis of pneumonia, and IV heparin for possible ACS, with hs-troponin elevated (peaked above 11k) in the absence of chest pain.  Cardiology consulted.   11/5: on 12 L/min HFNC oxygen, otherwise stable, no CP  11/6: O2 needs improving, weaned 6 >> 4 L/min HFNC O2 this morning.  Delirium noted overnight.    11/7: remains on 5 L/min O2.  Stable for transfer to floor.        Assessment/Plan:   Principal Problem:   Acute hypoxemic respiratory failure (HCC) Active Problems:   Severe sepsis (HCC)   CAP (community acquired pneumonia)   CAD (coronary artery disease)   COPD with acute exacerbation (HCC)   NSTEMI (non-ST elevated myocardial infarction) ([place])   AKI (acute kidney injury) ([place])   Known medical problems   Goals of care, counseling/discussion   Delirium due to multiple etiologies, acute, hyperactive   Acute pulmonary embolism (HCC)   Hyponatremia   Acute on chronic diastolic CHF (congestive heart failure) ([place])    Body mass index is 22.63 kg/m.   Severe sepsis secondary to community-acquired multifocal Acute on chronic diastolic CHF Probable acute pulmonary embolism Acute hypoxic respiratory failure Delirium with underlying dementia CAD with coronary stenting COPD with acute exacerbation AKI on CKD stage IIIa Hypertension Parkinson's  disease Hyperlipidemia   PLAN  Continue comfort measures Patient is not a candidate for discharge to the hospice facility per hospice team. Follow-up with social worker/case management to assist with disposition    Diet Order             DIET - DYS 1 Room service appropriate? Yes with Assist; Fluid consistency: Thin  Diet effective now                            Consultants: Cardiologist  Procedures: None    Medications:    Chlorhexidine Gluconate Cloth  6 each Topical Daily   sodium chloride flush  3 mL Intravenous Q12H   Continuous Infusions:  sodium chloride        Anti-infectives (From admission, onward)    Start     Dose/Rate Route Frequency Ordered Stop   05/05/22 1315  amoxicillin-clavulanate (AUGMENTIN) 500-125 MG per tablet 1 tablet        1 tablet Oral 2 times daily 05/05/22 1303 05/06/22 1054   05/03/22 2200  amoxicillin-clavulanate (AUGMENTIN) 500-125 MG per tablet 1 tablet        1 tablet Oral 2 times daily 05/03/22 1231 05/04/22 2220   05/02/22 2200  azithromycin (ZITHROMAX) tablet 500 mg  Status:  Discontinued        500 mg Oral Daily at bedtime 05/02/22 0936 05/03/22 1230   05/01/22 0015  cefTRIAXone (ROCEPHIN) 2 g in sodium chloride 0.9 % 100 mL IVPB  Status:  Discontinued  2 g 200 mL/hr over 30 Minutes Intravenous Every 24 hours 05/01/22 0003 05/03/22 1230   05/01/22 0015  azithromycin (ZITHROMAX) 500 mg in sodium chloride 0.9 % 250 mL IVPB  Status:  Discontinued        500 mg 250 mL/hr over 60 Minutes Intravenous Every 24 hours 05/01/22 0003 05/02/22 0935              Family Communication/Anticipated D/C date and plan/Code Status   DVT prophylaxis:      Code Status: DNR  Family Communication: None Disposition Plan: Plan to discharge to SNF   Status is: Inpatient Remains inpatient appropriate because: Comfort measures awaiting disposition to hospice house       Subjective:   Interval events  noted.  He is confused and cannot provide any history.  2 CNA's were at the bedside changing him because he had just had a bowel movement.  Objective:    Vitals:   05/06/22 2145 05/07/22 0700 05/08/22 0851 05/08/22 0947  BP: (!) 169/66 (!) 194/79 (!) 167/70   Pulse: 87 91 88   Resp: '20 19 16   '$ Temp: 98.9 F (37.2 C) 99.3 F (37.4 C) 98.1 F (36.7 C)   TempSrc:      SpO2: 94% 97% 96% 96%  Weight:      Height:       No data found.   Intake/Output Summary (Last 24 hours) at 05/08/2022 1432 Last data filed at 05/08/2022 1140 Gross per 24 hour  Intake --  Output 1800 ml  Net -1800 ml   Filed Weights   05/01/22 1209 05/04/22 0500 05/06/22 0431  Weight: 68.9 kg 69.9 kg 69.5 kg    Exam:  GEN: NAD SKIN: Warm and dry EYES: EOMI ENT: MMM CV: RRR PULM: Coarse breath sounds ABD: soft, ND, NT, +BS CNS: Alert but disoriented, non focal EXT: No edema or tenderness     Data Reviewed:   I have personally reviewed following labs and imaging studies:  Labs: Labs show the following:   Basic Metabolic Panel: Recent Labs  Lab 05/02/22 0228 05/03/22 0441 05/03/22 1620 05/04/22 0538 05/05/22 0512  NA 133* 132* 131* 131* 136  K 4.9 4.5 5.0 4.6 4.5  CL 104 103 103 103 108  CO2 17* 17* 17* 19* 20*  GLUCOSE 199* 206* 253* 200* 186*  BUN 58* 80* 89* 93* 95*  CREATININE 2.24* 2.59* 2.88* 2.80* 2.60*  CALCIUM 8.5* 8.4* 8.7* 8.3* 8.5*  MG 1.9 2.1  --  2.1 2.2  PHOS  --  4.3  --   --   --    GFR Estimated Creatinine Clearance: 19.7 mL/min (A) (by C-G formula based on SCr of 2.6 mg/dL (H)). Liver Function Tests: No results for input(s): "AST", "ALT", "ALKPHOS", "BILITOT", "PROT", "ALBUMIN" in the last 168 hours.  No results for input(s): "LIPASE", "AMYLASE" in the last 168 hours. No results for input(s): "AMMONIA" in the last 168 hours. Coagulation profile No results for input(s): "INR", "PROTIME" in the last 168 hours.  CBC: Recent Labs  Lab 05/02/22 0628  05/03/22 0441 05/04/22 0538 05/05/22 0512  WBC 13.0* 10.1 9.2 9.2  HGB 9.2* 8.7* 8.7* 8.9*  HCT 27.8* 25.9* 25.5* 26.5*  MCV 85.0 83.5 83.6 83.9  PLT 262 268 276 302   Cardiac Enzymes: No results for input(s): "CKTOTAL", "CKMB", "CKMBINDEX", "TROPONINI" in the last 168 hours. BNP (last 3 results) No results for input(s): "PROBNP" in the last 8760 hours. CBG: Recent Labs  Lab  05/06/22 1154 05/06/22 1543 05/06/22 2142 05/07/22 0746 05/07/22 1234  GLUCAP 197* 224* 196* 128* 206*   D-Dimer: No results for input(s): "DDIMER" in the last 72 hours. Hgb A1c: No results for input(s): "HGBA1C" in the last 72 hours. Lipid Profile: No results for input(s): "CHOL", "HDL", "LDLCALC", "TRIG", "CHOLHDL", "LDLDIRECT" in the last 72 hours.  Thyroid function studies: No results for input(s): "TSH", "T4TOTAL", "T3FREE", "THYROIDAB" in the last 72 hours.  Invalid input(s): "FREET3" Anemia work up: No results for input(s): "VITAMINB12", "FOLATE", "FERRITIN", "TIBC", "IRON", "RETICCTPCT" in the last 72 hours. Sepsis Labs: Recent Labs  Lab 05/01/22 1437 05/02/22 0628 05/03/22 0441 05/03/22 1620 05/04/22 0538 05/05/22 0512  PROCALCITON  --   --   --  0.16  --   --   WBC  --  13.0* 10.1  --  9.2 9.2  LATICACIDVEN 1.8  --   --   --   --   --     Microbiology Recent Results (from the past 240 hour(s))  Culture, blood (routine x 2)     Status: None   Collection Time: 05/17/2022 11:26 PM   Specimen: BLOOD  Result Value Ref Range Status   Specimen Description BLOOD BLOOD LEFT HAND  Final   Special Requests   Final    BOTTLES DRAWN AEROBIC AND ANAEROBIC Blood Culture adequate volume   Culture   Final    NO GROWTH 5 DAYS Performed at [facility], [address] [place], [place] [address]    Report Status 05/05/2022 FINAL  Final  Culture, blood (routine x 2)     Status: None   Collection Time: 04/28/2022 11:26 PM   Specimen: BLOOD  Result Value Ref Range Status   Specimen  Description BLOOD BLOOD RIGHT ARM  Final   Special Requests   Final    BOTTLES DRAWN AEROBIC AND ANAEROBIC Blood Culture adequate volume   Culture   Final    NO GROWTH 5 DAYS Performed at [facility], [address], [place], [place] [address]    Report Status 05/05/2022 FINAL  Final  Resp Panel by RT-PCR (Flu A&B, Covid) Anterior Nasal Swab     Status: None   Collection Time: 05/23/2022 11:26 PM   Specimen: Anterior Nasal Swab  Result Value Ref Range Status   SARS Coronavirus 2 by RT PCR NEGATIVE NEGATIVE Final    Comment: (NOTE) SARS-CoV-2 target nucleic acids are NOT DETECTED.  The SARS-CoV-2 RNA is generally detectable in upper respiratory specimens during the acute phase of infection. The lowest concentration of SARS-CoV-2 viral copies this assay can detect is 138 copies/mL. A negative result does not preclude SARS-Cov-2 infection and should not be used as the sole basis for treatment or other patient management decisions. A negative result may occur with  improper specimen collection/handling, submission of specimen other than nasopharyngeal swab, presence of viral mutation(s) within the areas targeted by this assay, and inadequate number of viral copies(<138 copies/mL). A negative result must be combined with clinical observations, patient history, and epidemiological information. The expected result is Negative.  Fact Sheet for Patients:  EntrepreneurPulse.com.au  Fact Sheet for Healthcare Providers:  IncredibleEmployment.be  This test is no t yet approved or cleared by the [place] FDA and  has been authorized for detection and/or diagnosis of SARS-CoV-2 by FDA under an Emergency Use Authorization (EUA). This EUA will remain  in effect (meaning this test can be used) for the duration of the COVID-19 declaration under Section 564(b)(1)  of the Act, 21 U.S.C.section 360bbb-3(b)(1), unless the authorization is terminated   or revoked sooner.       Influenza A by PCR NEGATIVE NEGATIVE Final   Influenza B by PCR NEGATIVE NEGATIVE Final    Comment: (NOTE) The Xpert Xpress SARS-CoV-2/FLU/RSV plus assay is intended as an aid in the diagnosis of influenza from Nasopharyngeal swab specimens and should not be used as a sole basis for treatment. Nasal washings and aspirates are unacceptable for Xpert Xpress SARS-CoV-2/FLU/RSV testing.  Fact Sheet for Patients: EntrepreneurPulse.com.au  Fact Sheet for Healthcare Providers: IncredibleEmployment.be  This test is not yet approved or cleared by the [place] FDA and has been authorized for detection and/or diagnosis of SARS-CoV-2 by FDA under an Emergency Use Authorization (EUA). This EUA will remain in effect (meaning this test can be used) for the duration of the COVID-19 declaration under Section 564(b)(1) of the Act, 21 U.S.C. section 360bbb-3(b)(1), unless the authorization is terminated or revoked.  Performed at [facility], [address] [place], [place] [address]   MRSA Next Gen by PCR, Nasal     Status: None   Collection Time: 05/01/22 12:07 PM   Specimen: Nasal Mucosa; Nasal Swab  Result Value Ref Range Status   MRSA by PCR Next Gen NOT DETECTED NOT DETECTED Final    Comment: (NOTE) The GeneXpert MRSA Assay (FDA approved for NASAL specimens only), is one component of a comprehensive MRSA colonization surveillance program. It is not intended to diagnose MRSA infection nor to guide or monitor treatment for MRSA infections. Test performance is not FDA approved in patients less than 59 years old. Performed at [facility], [place], [place] [address]   Respiratory (~20 pathogens) panel by PCR     Status: Abnormal   Collection Time: 05/02/22  1:53 AM   Specimen: Nasopharyngeal Swab; Respiratory  Result Value Ref Range Status   Adenovirus NOT DETECTED NOT DETECTED Final    Coronavirus 229E NOT DETECTED NOT DETECTED Final    Comment: (NOTE) The Coronavirus on the Respiratory Panel, DOES NOT test for the novel  Coronavirus (2019 nCoV)    Coronavirus HKU1 NOT DETECTED NOT DETECTED Final   Coronavirus NL63 NOT DETECTED NOT DETECTED Final   Coronavirus OC43 NOT DETECTED NOT DETECTED Final   Metapneumovirus NOT DETECTED NOT DETECTED Final   Rhinovirus / Enterovirus DETECTED (A) NOT DETECTED Final   Influenza A NOT DETECTED NOT DETECTED Final   Influenza B NOT DETECTED NOT DETECTED Final   Parainfluenza Virus 1 NOT DETECTED NOT DETECTED Final   Parainfluenza Virus 2 NOT DETECTED NOT DETECTED Final   Parainfluenza Virus 3 NOT DETECTED NOT DETECTED Final   Parainfluenza Virus 4 NOT DETECTED NOT DETECTED Final   Respiratory Syncytial Virus NOT DETECTED NOT DETECTED Final   Bordetella pertussis NOT DETECTED NOT DETECTED Final   Bordetella Parapertussis NOT DETECTED NOT DETECTED Final   Chlamydophila pneumoniae NOT DETECTED NOT DETECTED Final   Mycoplasma pneumoniae NOT DETECTED NOT DETECTED Final    Comment: Performed at [facility], [place] [place], [place] [address]  Expectorated Sputum Assessment w Gram Stain, Rflx to Resp Cult     Status: None   Collection Time: 05/03/22  9:45 PM   Specimen: Sputum  Result Value Ref Range Status   Specimen Description SPUTUM  Final   Special Requests NONE  Final   Sputum evaluation   Final    THIS SPECIMEN IS ACCEPTABLE FOR SPUTUM CULTURE Performed at  [facility], [address], [place], [place] [address]    Report Status 05/04/2022 FINAL  Final  Culture, Respiratory w Gram Stain     Status: None   Collection Time: 05/03/22  9:45 PM   Specimen: SPU  Result Value Ref Range Status   Specimen Description   Final    SPUTUM Performed at [facility], [address] [place], [place] [address]    Special Requests   Final    NONE Reflexed from [id number] Performed at [facility], [address] [place], [place] [address]    Gram Stain   Final    RARE WBC PRESENT, PREDOMINANTLY PMN FEW BUDDING YEAST SEEN    Culture   Final    FEW CANDIDA ALBICANS NO STAPHYLOCOCCUS AUREUS ISOLATED No Pseudomonas species isolated Performed at [facility], [place] [address] [place], [place] [address]    Report Status 05/07/2022 FINAL  Final    Procedures and diagnostic studies:  No results found.             LOS: 7 days   Alajia Schmelzer  [facility]   Pager on [email or web address]. If 7PM-7AM, please contact night-coverage at [email or web address]     05/08/2022, 2:32 PM

## 2022-05-09 DIAGNOSIS — R6520 Severe sepsis without septic shock: Secondary | ICD-10-CM | POA: Diagnosis not present

## 2022-05-09 DIAGNOSIS — I2699 Other pulmonary embolism without acute cor pulmonale: Secondary | ICD-10-CM | POA: Diagnosis not present

## 2022-05-09 DIAGNOSIS — A419 Sepsis, unspecified organism: Secondary | ICD-10-CM | POA: Diagnosis not present

## 2022-05-09 DIAGNOSIS — J9601 Acute respiratory failure with hypoxia: Secondary | ICD-10-CM | POA: Diagnosis not present

## 2022-05-09 DIAGNOSIS — Z7189 Other specified counseling: Secondary | ICD-10-CM | POA: Diagnosis not present

## 2022-05-09 NOTE — Progress Notes (Signed)
Daily Progress Note   Patient Name: Dennis Serrano       Date: 05/09/2022 DOB: 28-Sep-1932  Age: 88 y.o. MRN#: [id number] Attending Physician: Jennye Boroughs, MD Primary Care Physician: Juline Patch, MD Admit Date: 04/29/2022  Reason for Consultation/Follow-up: Establishing goals of care  Subjective: Notes reviewed.  In to see patient.  No family at bedside.  He has eaten less than half of his breakfast.  He is not able to articulate if this is because he is not hungry or if he does not like the food.  He denies complaint at this time.  He appears comfortable.  Currently plans are in place for care focused on comfort with hospice to follow at discharge.  PMT will shadow.  Length of Stay: 8  Current Medications: Scheduled Meds:   Chlorhexidine Gluconate Cloth  6 each Topical Daily   sodium chloride flush  3 mL Intravenous Q12H    Continuous Infusions:  sodium chloride      PRN Meds: sodium chloride, acetaminophen **OR** acetaminophen, antiseptic oral rinse, glycopyrrolate **OR** glycopyrrolate **OR** glycopyrrolate, haloperidol **OR** haloperidol **OR** haloperidol lactate, ipratropium-albuterol, LORazepam **OR** LORazepam **OR** LORazepam, morphine injection, morphine CONCENTRATE **OR** morphine CONCENTRATE, ondansetron **OR** ondansetron (ZOFRAN) IV, polyethylene glycol, polyvinyl alcohol, sodium chloride, sodium chloride flush, traZODone  Physical Exam Pulmonary:     Effort: Pulmonary effort is normal.  Neurological:     Mental Status: He is alert.             Vital Signs: BP (!) 163/86 (BP Location: Left Arm)   Pulse 86   Temp 98 F (36.7 C)   Resp 18   Ht '5\' 9"'$  (1.753 m)   Wt 69.5 kg   SpO2 97%   BMI 22.63 kg/m  SpO2: SpO2: 97 % O2 Device: O2 Device: Room  Air O2 Flow Rate: O2 Flow Rate (L/min): 2 L/min  Intake/output summary:  Intake/Output Summary (Last 24 hours) at 05/09/2022 1321 Last data filed at 05/09/2022 0518 Gross per 24 hour  Intake --  Output 550 ml  Net -550 ml   LBM: Last BM Date : 05/08/22 Baseline Weight: Weight: 69.8 kg Most recent weight: Weight: 69.5 kg    Patient Active Problem List   Diagnosis Date Noted   Acute pulmonary embolism ([place]) 05/03/2022   Hyponatremia 05/03/2022   Acute  on chronic diastolic CHF (congestive heart failure) ([place]) 05/03/2022   Delirium due to multiple etiologies, acute, hyperactive 05/02/2022   Acute hypoxemic respiratory failure ([place]) 05/01/2022   Alzheimer's disease ([place]) 05/01/2022   COPD with acute exacerbation ([place]) 05/01/2022   NSTEMI (non-ST elevated myocardial infarction) ([place]) 05/01/2022   AKI (acute kidney injury) ([place]) 05/01/2022   Known medical problems 05/01/2022   Goals of care, counseling/discussion 05/01/2022   Medication overdose 09/02/2018   TIA (transient ischemic attack) 10/20/2016   Parkinson's disease without dyskinesia, without mention of fluctuations 10/11/2016   Persistent atrial fibrillation ([place]) 10/11/2016   Controlled type 2 diabetes mellitus without complication, with long-term current use of insulin ([place]) 10/11/2016   Pressure injury of skin 10/07/2016   Severe sepsis ([place]) 10/02/2016   CAP (community acquired pneumonia) 10/02/2016   HTN (hypertension) 10/02/2016   HLD (hyperlipidemia) 10/02/2016   CAD (coronary artery disease) 10/02/2016   GERD (gastroesophageal reflux disease) 10/02/2016   Malignant neoplasm of prostate ([place]) 02/15/2011    Palliative Care Assessment & Plan    Recommendations/Plan: Patient appears comfortable at this time.  Current plan in place for care focused on comfort.  Hospice to follow at discharge.  PMT will shadow.   Code Status:    Code Status Orders  (From admission, onward)           Start     Ordered    05/06/22 1225  Do not attempt resuscitation (DNR)  Continuous       Question Answer Comment  In the event of cardiac or respiratory ARREST Do not call a "code blue"   In the event of cardiac or respiratory ARREST Do not perform Intubation, CPR, defibrillation or ACLS   In the event of cardiac or respiratory ARREST Use medication by any route, position, wound care, and other measures to relive pain and suffering. May use oxygen, suction and manual treatment of airway obstruction as needed for comfort.   Comments MOST form in chart      05/06/22 1226           Code Status History     Date Active Date Inactive Code Status Order ID Comments User Context   05/01/2022 1341 05/06/2022 1226 DNR [id number]  Ezekiel Slocumb, DO Inpatient   05/01/2022 0102 05/01/2022 1341 Full Code [id number]  Clarnce Flock, MD ED   09/02/2018 2213 09/06/2018 2202 Full Code [id number]  Saundra Shelling, MD ED   10/20/2016 2336 10/21/2016 2132 Full Code [id number]  Nicholes Mango, MD Inpatient   10/02/2016 2324 10/08/2016 1631 Full Code [id number]  Lance Coon, MD Inpatient       Prognosis: Poor    Thank you for allowing the Palliative Medicine Team to assist in the care of this patient.    Asencion Gowda, NP  Please contact Palliative Medicine Team phone at [phone] for questions and concerns.

## 2022-05-09 NOTE — Progress Notes (Signed)
[facility]) Hospital liaison note  [facility] liaison continues to follow for discharge disposition and will assist in coordinating future care as appropriate once this is determined.   Thank you for the opportunity to participate in this patient's care Jhonnie Garner, BSN, RN Hospice hospital liaison [phone]

## 2022-05-09 NOTE — Progress Notes (Signed)
Nutrition Brief Note  Chart reviewed. Pt now transitioning to comfort care.  No further nutrition interventions planned at this time.  Please re-consult as needed.   Irina Okelly W, RD, LDN, CDCES Registered Dietitian II Certified Diabetes Care and Education Specialist Please refer to AMION for RD and/or RD on-call/weekend/after hours pager   

## 2022-05-09 NOTE — Progress Notes (Signed)
Progress Note    Platon Arocho  PZW:[id number] DOB: March 18, 1933  DOA: 05/04/2022 PCP: Juline Patch, MD      Brief Narrative:    Medical records reviewed and are as summarized below:   Dennis Serrano is a 88 y.o. male with medical history significant for CAD, Alzheimer's disease, hypertension, hyperlipidemia, Parkinson's, persistent A-fib, type 2 diabetes, COPD, who presented to the ED around 11 pm on 05/17/2022 for evaluation of worsening shortness of breath.    Patient was admitted for acute respiratory failure with hypoxia due to multifocal pneumonia.  He was started on IV antibiotics for working diagnosis of pneumonia, and IV heparin for possible ACS, with hs-troponin elevated (peaked above 11k) in the absence of chest pain.  Cardiology consulted.   11/5: on 12 L/min HFNC oxygen, otherwise stable, no CP  11/6: O2 needs improving, weaned 6 >> 4 L/min HFNC O2 this morning.  Delirium noted overnight.    11/7: remains on 5 L/min O2.  Stable for transfer to floor.        Assessment/Plan:   Principal Problem:   Acute hypoxemic respiratory failure (HCC) Active Problems:   Severe sepsis (HCC)   CAP (community acquired pneumonia)   CAD (coronary artery disease)   COPD with acute exacerbation (HCC)   NSTEMI (non-ST elevated myocardial infarction) ([place])   AKI (acute kidney injury) ([place])   Known medical problems   Goals of care, counseling/discussion   Delirium due to multiple etiologies, acute, hyperactive   Acute pulmonary embolism (HCC)   Hyponatremia   Acute on chronic diastolic CHF (congestive heart failure) ([place])    Body mass index is 22.63 kg/m.   Severe sepsis secondary to community-acquired multifocal pneumonia, rhinovirus infection Acute on chronic diastolic CHF Probable acute pulmonary embolism Acute hypoxic respiratory failure Delirium with underlying dementia CAD with coronary stenting COPD with acute exacerbation AKI on CKD stage  IIIa Hypertension Parkinson's disease Hyperlipidemia   PLAN  Continue comfort measures Patient is not a candidate for discharge to hospice house.. Family has no alternative discharge plan at the moment and is trying to come up with clinic. Follow-up case manager to assist with disposition    Diet Order             DIET - DYS 1 Room service appropriate? Yes with Assist; Fluid consistency: Thin  Diet effective now                            Consultants: Cardiologist  Procedures: None    Medications:    Chlorhexidine Gluconate Cloth  6 each Topical Daily   sodium chloride flush  3 mL Intravenous Q12H   Continuous Infusions:  sodium chloride        Anti-infectives (From admission, onward)    Start     Dose/Rate Route Frequency Ordered Stop   05/05/22 1315  amoxicillin-clavulanate (AUGMENTIN) 500-125 MG per tablet 1 tablet        1 tablet Oral 2 times daily 05/05/22 1303 05/06/22 1054   05/03/22 2200  amoxicillin-clavulanate (AUGMENTIN) 500-125 MG per tablet 1 tablet        1 tablet Oral 2 times daily 05/03/22 1231 05/04/22 2220   05/02/22 2200  azithromycin (ZITHROMAX) tablet 500 mg  Status:  Discontinued        500 mg Oral Daily at bedtime 05/02/22 0936 05/03/22 1230   05/01/22 0015  cefTRIAXone (ROCEPHIN) 2 g in sodium chloride 0.9 %  100 mL IVPB  Status:  Discontinued        2 g 200 mL/hr over 30 Minutes Intravenous Every 24 hours 05/01/22 0003 05/03/22 1230   05/01/22 0015  azithromycin (ZITHROMAX) 500 mg in sodium chloride 0.9 % 250 mL IVPB  Status:  Discontinued        500 mg 250 mL/hr over 60 Minutes Intravenous Every 24 hours 05/01/22 0003 05/02/22 0935              Family Communication/Anticipated D/C date and plan/Code Status   DVT prophylaxis:      Code Status: DNR  Family Communication: None Disposition Plan: Plan to discharge to hospice house/ home with hospice   Status is: Inpatient Remains inpatient appropriate  because: Comfort measures        Subjective:   Interval events noted. He cannot provide any history because of confusion.   Objective:    Vitals:   05/08/22 0851 05/08/22 0947 05/09/22 0013 05/09/22 0751  BP: (!) 167/70  (!) 190/90 (!) 163/86  Pulse: 88  85 86  Resp: 16  18   Temp: 98.1 F (36.7 C)  (!) 97.4 F (36.3 C) 98 F (36.7 C)  TempSrc:      SpO2: 96% 96% 99% 97%  Weight:      Height:       No data found.   Intake/Output Summary (Last 24 hours) at 05/09/2022 1535 Last data filed at 05/09/2022 0518 Gross per 24 hour  Intake --  Output 550 ml  Net -550 ml   Filed Weights   05/01/22 1209 05/04/22 0500 05/06/22 0431  Weight: 68.9 kg 69.9 kg 69.5 kg    Exam:  GEN: NAD SKIN: Warm and dry EYES: Anicteric ENT: MMM CV: RRR PULM: CTA B ABD: soft, ND, NT, +BS CNS: Alert but confused, non focal EXT: No edema or tenderness       Data Reviewed:   I have personally reviewed following labs and imaging studies:  Labs: Labs show the following:   Basic Metabolic Panel: Recent Labs  Lab 05/03/22 0441 05/03/22 1620 05/04/22 0538 05/05/22 0512  NA 132* 131* 131* 136  K 4.5 5.0 4.6 4.5  CL 103 103 103 108  CO2 17* 17* 19* 20*  GLUCOSE 206* 253* 200* 186*  BUN 80* 89* 93* 95*  CREATININE 2.59* 2.88* 2.80* 2.60*  CALCIUM 8.4* 8.7* 8.3* 8.5*  MG 2.1  --  2.1 2.2  PHOS 4.3  --   --   --    GFR Estimated Creatinine Clearance: 19.7 mL/min (A) (by C-G formula based on SCr of 2.6 mg/dL (H)). Liver Function Tests: No results for input(s): "AST", "ALT", "ALKPHOS", "BILITOT", "PROT", "ALBUMIN" in the last 168 hours.  No results for input(s): "LIPASE", "AMYLASE" in the last 168 hours. No results for input(s): "AMMONIA" in the last 168 hours. Coagulation profile No results for input(s): "INR", "PROTIME" in the last 168 hours.  CBC: Recent Labs  Lab 05/03/22 0441 05/04/22 0538 05/05/22 0512  WBC 10.1 9.2 9.2  HGB 8.7* 8.7* 8.9*  HCT 25.9*  25.5* 26.5*  MCV 83.5 83.6 83.9  PLT 268 276 302   Cardiac Enzymes: No results for input(s): "CKTOTAL", "CKMB", "CKMBINDEX", "TROPONINI" in the last 168 hours. BNP (last 3 results) No results for input(s): "PROBNP" in the last 8760 hours. CBG: Recent Labs  Lab 05/06/22 1154 05/06/22 1543 05/06/22 2142 05/07/22 0746 05/07/22 1234  GLUCAP 197* 224* 196* 128* 206*   D-Dimer: No results  for input(s): "DDIMER" in the last 72 hours. Hgb A1c: No results for input(s): "HGBA1C" in the last 72 hours. Lipid Profile: No results for input(s): "CHOL", "HDL", "LDLCALC", "TRIG", "CHOLHDL", "LDLDIRECT" in the last 72 hours.  Thyroid function studies: No results for input(s): "TSH", "T4TOTAL", "T3FREE", "THYROIDAB" in the last 72 hours.  Invalid input(s): "FREET3" Anemia work up: No results for input(s): "VITAMINB12", "FOLATE", "FERRITIN", "TIBC", "IRON", "RETICCTPCT" in the last 72 hours. Sepsis Labs: Recent Labs  Lab 05/03/22 0441 05/03/22 1620 05/04/22 0538 05/05/22 0512  PROCALCITON  --  0.16  --   --   WBC 10.1  --  9.2 9.2    Microbiology Recent Results (from the past 240 hour(s))  Culture, blood (routine x 2)     Status: None   Collection Time: 05/06/2022 11:26 PM   Specimen: BLOOD  Result Value Ref Range Status   Specimen Description BLOOD BLOOD LEFT HAND  Final   Special Requests   Final    BOTTLES DRAWN AEROBIC AND ANAEROBIC Blood Culture adequate volume   Culture   Final    NO GROWTH 5 DAYS Performed at [facility], [address], [place], [place] [address]    Report Status 05/05/2022 FINAL  Final  Culture, blood (routine x 2)     Status: None   Collection Time: 05/04/2022 11:26 PM   Specimen: BLOOD  Result Value Ref Range Status   Specimen Description BLOOD BLOOD RIGHT ARM  Final   Special Requests   Final    BOTTLES DRAWN AEROBIC AND ANAEROBIC Blood Culture adequate volume   Culture   Final    NO GROWTH 5 DAYS Performed at [facility],  [address], [place], [place] [address]    Report Status 05/05/2022 FINAL  Final  Resp Panel by RT-PCR (Flu A&B, Covid) Anterior Nasal Swab     Status: None   Collection Time: 04/28/2022 11:26 PM   Specimen: Anterior Nasal Swab  Result Value Ref Range Status   SARS Coronavirus 2 by RT PCR NEGATIVE NEGATIVE Final    Comment: (NOTE) SARS-CoV-2 target nucleic acids are NOT DETECTED.  The SARS-CoV-2 RNA is generally detectable in upper respiratory specimens during the acute phase of infection. The lowest concentration of SARS-CoV-2 viral copies this assay can detect is 138 copies/mL. A negative result does not preclude SARS-Cov-2 infection and should not be used as the sole basis for treatment or other patient management decisions. A negative result may occur with  improper specimen collection/handling, submission of specimen other than nasopharyngeal swab, presence of viral mutation(s) within the areas targeted by this assay, and inadequate number of viral copies(<138 copies/mL). A negative result must be combined with clinical observations, patient history, and epidemiological information. The expected result is Negative.  Fact Sheet for Patients:  EntrepreneurPulse.com.au  Fact Sheet for Healthcare Providers:  IncredibleEmployment.be  This test is no t yet approved or cleared by the [place] FDA and  has been authorized for detection and/or diagnosis of SARS-CoV-2 by FDA under an Emergency Use Authorization (EUA). This EUA will remain  in effect (meaning this test can be used) for the duration of the COVID-19 declaration under Section 564(b)(1) of the Act, 21 U.S.C.section 360bbb-3(b)(1), unless the authorization is terminated  or revoked sooner.       Influenza A by PCR NEGATIVE NEGATIVE Final   Influenza B by PCR NEGATIVE NEGATIVE Final    Comment: (NOTE) The Xpert Xpress SARS-CoV-2/FLU/RSV plus assay is intended as an aid in  the  diagnosis of influenza from Nasopharyngeal swab specimens and should not be used as a sole basis for treatment. Nasal washings and aspirates are unacceptable for Xpert Xpress SARS-CoV-2/FLU/RSV testing.  Fact Sheet for Patients: EntrepreneurPulse.com.au  Fact Sheet for Healthcare Providers: IncredibleEmployment.be  This test is not yet approved or cleared by the [place] FDA and has been authorized for detection and/or diagnosis of SARS-CoV-2 by FDA under an Emergency Use Authorization (EUA). This EUA will remain in effect (meaning this test can be used) for the duration of the COVID-19 declaration under Section 564(b)(1) of the Act, 21 U.S.C. section 360bbb-3(b)(1), unless the authorization is terminated or revoked.  Performed at [facility], [address] [place], [place] [address]   MRSA Next Gen by PCR, Nasal     Status: None   Collection Time: 05/01/22 12:07 PM   Specimen: Nasal Mucosa; Nasal Swab  Result Value Ref Range Status   MRSA by PCR Next Gen NOT DETECTED NOT DETECTED Final    Comment: (NOTE) The GeneXpert MRSA Assay (FDA approved for NASAL specimens only), is one component of a comprehensive MRSA colonization surveillance program. It is not intended to diagnose MRSA infection nor to guide or monitor treatment for MRSA infections. Test performance is not FDA approved in patients less than 13 years old. Performed at [facility], [place], [place] [address]   Respiratory (~20 pathogens) panel by PCR     Status: Abnormal   Collection Time: 05/02/22  1:53 AM   Specimen: Nasopharyngeal Swab; Respiratory  Result Value Ref Range Status   Adenovirus NOT DETECTED NOT DETECTED Final   Coronavirus 229E NOT DETECTED NOT DETECTED Final    Comment: (NOTE) The Coronavirus on the Respiratory Panel, DOES NOT test for the novel  Coronavirus (2019 nCoV)    Coronavirus HKU1 NOT DETECTED NOT DETECTED  Final   Coronavirus NL63 NOT DETECTED NOT DETECTED Final   Coronavirus OC43 NOT DETECTED NOT DETECTED Final   Metapneumovirus NOT DETECTED NOT DETECTED Final   Rhinovirus / Enterovirus DETECTED (A) NOT DETECTED Final   Influenza A NOT DETECTED NOT DETECTED Final   Influenza B NOT DETECTED NOT DETECTED Final   Parainfluenza Virus 1 NOT DETECTED NOT DETECTED Final   Parainfluenza Virus 2 NOT DETECTED NOT DETECTED Final   Parainfluenza Virus 3 NOT DETECTED NOT DETECTED Final   Parainfluenza Virus 4 NOT DETECTED NOT DETECTED Final   Respiratory Syncytial Virus NOT DETECTED NOT DETECTED Final   Bordetella pertussis NOT DETECTED NOT DETECTED Final   Bordetella Parapertussis NOT DETECTED NOT DETECTED Final   Chlamydophila pneumoniae NOT DETECTED NOT DETECTED Final   Mycoplasma pneumoniae NOT DETECTED NOT DETECTED Final    Comment: Performed at [facility], [place] [place], [place] [address]  Expectorated Sputum Assessment w Gram Stain, Rflx to Resp Cult     Status: None   Collection Time: 05/03/22  9:45 PM   Specimen: Sputum  Result Value Ref Range Status   Specimen Description SPUTUM  Final   Special Requests NONE  Final   Sputum evaluation   Final    THIS SPECIMEN IS ACCEPTABLE FOR SPUTUM CULTURE Performed at [facility], [address] [place], [place] [address]    Report Status 05/04/2022 FINAL  Final  Culture, Respiratory w Gram Stain     Status: None   Collection Time: 05/03/22  9:45 PM   Specimen: SPU  Result Value Ref Range Status   Specimen Description  Final    SPUTUM Performed at [facility], [address] [place], [place] [address]    Special Requests   Final    NONE Reflexed from [id number] Performed at [facility], [address] [place], [place] [address]    Gram Stain   Final    RARE WBC PRESENT, PREDOMINANTLY PMN FEW BUDDING YEAST SEEN    Culture   Final    FEW CANDIDA ALBICANS NO STAPHYLOCOCCUS AUREUS  ISOLATED No Pseudomonas species isolated Performed at [facility], [place] [address] [place], [place] [address]    Report Status 05/07/2022 FINAL  Final    Procedures and diagnostic studies:  No results found.             LOS: 8 days   Ekam Besson  [facility]   Pager on [email or web address]. If 7PM-7AM, please contact night-coverage at [email or web address]     05/09/2022, 3:35 PM

## 2022-05-09 NOTE — Progress Notes (Signed)
[facility]) Hospital liaison note   Tom [place] continues to follow for discharge disposition and will assist in coordinating future care as appropriate once this is determined.    [facility] will continue to follow for any discharge planning needs and to coordinate continuation of hospice care.     Please call with any questions/concerns.    Thank you for the opportunity to participate in this patient's care.   Daphene Calamity, MSW [facility] Liaison  [phone]

## 2022-05-09 NOTE — TOC Progression Note (Signed)
Transition of Care [facility]) - Progression Note    Patient Details  Name: Levie Wages MRN: [id number] Date of Birth: 1932-10-25  Transition of Care [facility]) CM/SW [place], RN Phone Number: 05/09/2022, 10:47 AM  Clinical Narrative:     Called out to Lucretia Field the Patient;s POA, Unable to reach, left a general voicemail asking for a call back to determine the plan  Expected Discharge Plan: [place] Barriers to Discharge: Continued Medical Work up  Expected Discharge Plan and Services Expected Discharge Plan: [place]                                               Social Determinants of Health (SDOH) Interventions    Readmission Risk Interventions     No data to display

## 2022-05-09 NOTE — Progress Notes (Signed)
[facility]) Hospital liaison note  [facility] liaison continues to follow for discharge disposition and will assist in coordinating future care as appropriate once this is determined.   Thank you for the opportunity to participate in this patient's care Jhonnie Garner, BSN, RN Hospice hospital liaison [phone]

## 2022-05-09 NOTE — TOC Progression Note (Signed)
Transition of Care [facility]) - Progression Note    Patient Details  Name: Dennis Serrano MRN: [id number] Date of Birth: 08/02/1932  Transition of Care [facility]) CM/SW [place], RN Phone Number: 05/09/2022, 2:41 PM  Clinical Narrative:    I spoke with [name] the patient's POA and daughter, she stated that they do not have a plan at this time She will contact me when they are able to figure out what they are going to do   Expected Discharge Plan: Skilled Nursing Facility Barriers to Discharge: Continued Medical Work up  Expected Discharge Plan and Services Expected Discharge Plan: [place]                                               Social Determinants of Health (SDOH) Interventions    Readmission Risk Interventions     No data to display

## 2022-05-10 DIAGNOSIS — I2699 Other pulmonary embolism without acute cor pulmonale: Secondary | ICD-10-CM | POA: Diagnosis not present

## 2022-05-10 DIAGNOSIS — R6520 Severe sepsis without septic shock: Secondary | ICD-10-CM | POA: Diagnosis not present

## 2022-05-10 DIAGNOSIS — A419 Sepsis, unspecified organism: Secondary | ICD-10-CM | POA: Diagnosis not present

## 2022-05-11 NOTE — Progress Notes (Signed)
   May 13, 2022 2318-09-10  Attending [place]  Attending Physician Notified Y  Attending Physician (First and Last Name) Jennye Boroughs MD  Post Mortem Checklist  Date of Death May 13, 2022  Time of Death 2318/09/10  Pronounced By Rubbie Battiest, RN; Wallie Char, RN  Next of kin notified Yes  Name of next of kin notified of death Lucretia Field; [name] Freeze  Contact Person's Relationship to Patient Daughter;Other relative (Granddaughter)  Contact Person's Phone Number Lucretia Field: [phone]; Granddaughter: [phone]  Was the patient a No Code Blue or a Limited Code Blue? No  Did the patient die unattended? No  Patient restrained? 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 N  Dead on Arrival (Emergency Department)  Patient dead on arrival? No  Medical Examiner  Is this a medical examiner's case? [place] home name/address/phone # [facility] not listed  Name/Address/Phone # of [facility]/ [address] [place], [place] [phone]  Planned location of pickup [place]

## 2022-05-27 NOTE — Progress Notes (Signed)
Progress Note    Chau Sawin  URK:[id number] DOB: 1933-04-09  DOA: 05/08/2022 PCP: Juline Patch, MD      Brief Narrative:    Medical records reviewed and are as summarized below:   Dennis Serrano is a 88 y.o. male with medical history significant for CAD, Alzheimer's disease, hypertension, hyperlipidemia, Parkinson's, persistent A-fib, type 2 diabetes, COPD, who presented to the ED around 11 pm on 05/26/2022 for evaluation of worsening shortness of breath.    Patient was admitted for acute respiratory failure with hypoxia due to multifocal pneumonia.  He was started on IV antibiotics for working diagnosis of pneumonia, and IV heparin for possible ACS, with hs-troponin elevated (peaked above 11k) in the absence of chest pain.  Cardiology consulted.   11/5: on 12 L/min HFNC oxygen, otherwise stable, no CP  11/6: O2 needs improving, weaned 6 >> 4 L/min HFNC O2 this morning.  Delirium noted overnight.    11/7: remains on 5 L/min O2.  Stable for transfer to floor.        Assessment/Plan:   Principal Problem:   Acute hypoxemic respiratory failure (HCC) Active Problems:   Severe sepsis (HCC)   CAP (community acquired pneumonia)   CAD (coronary artery disease)   COPD with acute exacerbation (HCC)   NSTEMI (non-ST elevated myocardial infarction) ([place])   AKI (acute kidney injury) ([place])   Known medical problems   Goals of care, counseling/discussion   Delirium due to multiple etiologies, acute, hyperactive   Acute pulmonary embolism (HCC)   Hyponatremia   Acute on chronic diastolic CHF (congestive heart failure) ([place])    Body mass index is 22.63 kg/m.   Severe sepsis secondary to community-acquired multifocal pneumonia, rhinovirus infection Acute on chronic diastolic CHF Probable acute pulmonary embolism Acute hypoxic respiratory failure Delirium with underlying dementia CAD with coronary stenting COPD with acute exacerbation AKI on CKD stage  IIIa Hypertension Parkinson's disease Hyperlipidemia   PLAN  Continue comfort measures. He has been evaluated by the hospice team but he is deemed not to be a candidate for discharge to hospice house. Plan of care was discussed with [name], daughter.  She thinks that patient has declined since last hospice evaluation and she would like hospice to reevaluate patient for possible discharge to hospice house     Diet Order             DIET - DYS 1 Room service appropriate? Yes with Assist; Fluid consistency: Thin  Diet effective now                            Consultants: Cardiologist  Procedures: None    Medications:    Chlorhexidine Gluconate Cloth  6 each Topical Daily   sodium chloride flush  3 mL Intravenous Q12H   Continuous Infusions:  sodium chloride        Anti-infectives (From admission, onward)    Start     Dose/Rate Route Frequency Ordered Stop   05/05/22 1315  amoxicillin-clavulanate (AUGMENTIN) 500-125 MG per tablet 1 tablet        1 tablet Oral 2 times daily 05/05/22 1303 05/06/22 1054   05/03/22 2200  amoxicillin-clavulanate (AUGMENTIN) 500-125 MG per tablet 1 tablet        1 tablet Oral 2 times daily 05/03/22 1231 05/04/22 2220   05/02/22 2200  azithromycin (ZITHROMAX) tablet 500 mg  Status:  Discontinued        500 mg  Oral Daily at bedtime 05/02/22 0936 05/03/22 1230   05/01/22 0015  cefTRIAXone (ROCEPHIN) 2 g in sodium chloride 0.9 % 100 mL IVPB  Status:  Discontinued        2 g 200 mL/hr over 30 Minutes Intravenous Every 24 hours 05/01/22 0003 05/03/22 1230   05/01/22 0015  azithromycin (ZITHROMAX) 500 mg in sodium chloride 0.9 % 250 mL IVPB  Status:  Discontinued        500 mg 250 mL/hr over 60 Minutes Intravenous Every 24 hours 05/01/22 0003 05/02/22 0935              Family Communication/Anticipated D/C date and plan/Code Status   DVT prophylaxis:      Code Status: DNR  Family Communication: Plan discussed with  [name], daughter, over the phone.   Disposition Plan: Plan to discharge to hospice house/ home with hospice   Status is: Inpatient Remains inpatient appropriate because: Comfort measures        Subjective:   He is confused and cannot provide any history.  Interval events noted.  He is not eating much and he is sleeping more.  Objective:    Vitals:   05/09/22 0013 05/09/22 0751 05/09/22 2326 2022/06/08 0811  BP: (!) 190/90 (!) 163/86 (!) 177/83 (!) 191/81  Pulse: 85 86 90 89  Resp: '18  16 16  '$ Temp: (!) 97.4 F (36.3 C) 98 F (36.7 C) 99.3 F (37.4 C) 98 F (36.7 C)  TempSrc:      SpO2: 99% 97% 100% 95%  Weight:      Height:       No data found.   Intake/Output Summary (Last 24 hours) at 06/08/22 1547 Last data filed at 06/08/2022 1508 Gross per 24 hour  Intake --  Output 1400 ml  Net -1400 ml   Filed Weights   05/01/22 1209 05/04/22 0500 05/06/22 0431  Weight: 68.9 kg 69.9 kg 69.5 kg    Exam:  GEN: NAD SKIN: Warm and dry EYES: EOMI ENT: MMM CV: RRR PULM: CTA B ABD: soft, ND, NT, +BS CNS: Drowsy but arousable, non focal EXT: No edema or tenderness       Data Reviewed:   I have personally reviewed following labs and imaging studies:  Labs: Labs show the following:   Basic Metabolic Panel: Recent Labs  Lab 05/03/22 1620 05/04/22 0538 05/05/22 0512  NA 131* 131* 136  K 5.0 4.6 4.5  CL 103 103 108  CO2 17* 19* 20*  GLUCOSE 253* 200* 186*  BUN 89* 93* 95*  CREATININE 2.88* 2.80* 2.60*  CALCIUM 8.7* 8.3* 8.5*  MG  --  2.1 2.2   GFR Estimated Creatinine Clearance: 19.7 mL/min (A) (by C-G formula based on SCr of 2.6 mg/dL (H)). Liver Function Tests: No results for input(s): "AST", "ALT", "ALKPHOS", "BILITOT", "PROT", "ALBUMIN" in the last 168 hours.  No results for input(s): "LIPASE", "AMYLASE" in the last 168 hours. No results for input(s): "AMMONIA" in the last 168 hours. Coagulation profile No results for input(s): "INR",  "PROTIME" in the last 168 hours.  CBC: Recent Labs  Lab 05/04/22 0538 05/05/22 0512  WBC 9.2 9.2  HGB 8.7* 8.9*  HCT 25.5* 26.5*  MCV 83.6 83.9  PLT 276 302   Cardiac Enzymes: No results for input(s): "CKTOTAL", "CKMB", "CKMBINDEX", "TROPONINI" in the last 168 hours. BNP (last 3 results) No results for input(s): "PROBNP" in the last 8760 hours. CBG: Recent Labs  Lab 05/06/22 1154 05/06/22 1543 05/06/22 2142  05/07/22 0746 05/07/22 1234  GLUCAP 197* 224* 196* 128* 206*   D-Dimer: No results for input(s): "DDIMER" in the last 72 hours. Hgb A1c: No results for input(s): "HGBA1C" in the last 72 hours. Lipid Profile: No results for input(s): "CHOL", "HDL", "LDLCALC", "TRIG", "CHOLHDL", "LDLDIRECT" in the last 72 hours.  Thyroid function studies: No results for input(s): "TSH", "T4TOTAL", "T3FREE", "THYROIDAB" in the last 72 hours.  Invalid input(s): "FREET3" Anemia work up: No results for input(s): "VITAMINB12", "FOLATE", "FERRITIN", "TIBC", "IRON", "RETICCTPCT" in the last 72 hours. Sepsis Labs: Recent Labs  Lab 05/03/22 1620 05/04/22 0538 05/05/22 0512  PROCALCITON 0.16  --   --   WBC  --  9.2 9.2    Microbiology Recent Results (from the past 240 hour(s))  Culture, blood (routine x 2)     Status: None   Collection Time: 05/04/2022 11:26 PM   Specimen: BLOOD  Result Value Ref Range Status   Specimen Description BLOOD BLOOD LEFT HAND  Final   Special Requests   Final    BOTTLES DRAWN AEROBIC AND ANAEROBIC Blood Culture adequate volume   Culture   Final    NO GROWTH 5 DAYS Performed at [facility], [address] [place], [place] [address]    Report Status 05/05/2022 FINAL  Final  Culture, blood (routine x 2)     Status: None   Collection Time: 05/11/2022 11:26 PM   Specimen: BLOOD  Result Value Ref Range Status   Specimen Description BLOOD BLOOD RIGHT ARM  Final   Special Requests   Final    BOTTLES DRAWN AEROBIC AND ANAEROBIC Blood Culture  adequate volume   Culture   Final    NO GROWTH 5 DAYS Performed at [facility], [address], [place], [place] [address]    Report Status 05/05/2022 FINAL  Final  Resp Panel by RT-PCR (Flu A&B, Covid) Anterior Nasal Swab     Status: None   Collection Time: 05/17/2022 11:26 PM   Specimen: Anterior Nasal Swab  Result Value Ref Range Status   SARS Coronavirus 2 by RT PCR NEGATIVE NEGATIVE Final    Comment: (NOTE) SARS-CoV-2 target nucleic acids are NOT DETECTED.  The SARS-CoV-2 RNA is generally detectable in upper respiratory specimens during the acute phase of infection. The lowest concentration of SARS-CoV-2 viral copies this assay can detect is 138 copies/mL. A negative result does not preclude SARS-Cov-2 infection and should not be used as the sole basis for treatment or other patient management decisions. A negative result may occur with  improper specimen collection/handling, submission of specimen other than nasopharyngeal swab, presence of viral mutation(s) within the areas targeted by this assay, and inadequate number of viral copies(<138 copies/mL). A negative result must be combined with clinical observations, patient history, and epidemiological information. The expected result is Negative.  Fact Sheet for Patients:  EntrepreneurPulse.com.au  Fact Sheet for Healthcare Providers:  IncredibleEmployment.be  This test is no t yet approved or cleared by the [place] FDA and  has been authorized for detection and/or diagnosis of SARS-CoV-2 by FDA under an Emergency Use Authorization (EUA). This EUA will remain  in effect (meaning this test can be used) for the duration of the COVID-19 declaration under Section 564(b)(1) of the Act, 21 U.S.C.section 360bbb-3(b)(1), unless the authorization is terminated  or revoked sooner.       Influenza A by PCR NEGATIVE NEGATIVE Final   Influenza B by PCR NEGATIVE NEGATIVE Final     Comment: (NOTE) The  Xpert Xpress SARS-CoV-2/FLU/RSV plus assay is intended as an aid in the diagnosis of influenza from Nasopharyngeal swab specimens and should not be used as a sole basis for treatment. Nasal washings and aspirates are unacceptable for Xpert Xpress SARS-CoV-2/FLU/RSV testing.  Fact Sheet for Patients: EntrepreneurPulse.com.au  Fact Sheet for Healthcare Providers: IncredibleEmployment.be  This test is not yet approved or cleared by the [place] FDA and has been authorized for detection and/or diagnosis of SARS-CoV-2 by FDA under an Emergency Use Authorization (EUA). This EUA will remain in effect (meaning this test can be used) for the duration of the COVID-19 declaration under Section 564(b)(1) of the Act, 21 U.S.C. section 360bbb-3(b)(1), unless the authorization is terminated or revoked.  Performed at [facility], [address] [place], [place] [address]   MRSA Next Gen by PCR, Nasal     Status: None   Collection Time: 05/01/22 12:07 PM   Specimen: Nasal Mucosa; Nasal Swab  Result Value Ref Range Status   MRSA by PCR Next Gen NOT DETECTED NOT DETECTED Final    Comment: (NOTE) The GeneXpert MRSA Assay (FDA approved for NASAL specimens only), is one component of a comprehensive MRSA colonization surveillance program. It is not intended to diagnose MRSA infection nor to guide or monitor treatment for MRSA infections. Test performance is not FDA approved in patients less than 39 years old. Performed at [facility], [place], [place] [address]   Respiratory (~20 pathogens) panel by PCR     Status: Abnormal   Collection Time: 05/02/22  1:53 AM   Specimen: Nasopharyngeal Swab; Respiratory  Result Value Ref Range Status   Adenovirus NOT DETECTED NOT DETECTED Final   Coronavirus 229E NOT DETECTED NOT DETECTED Final    Comment: (NOTE) The Coronavirus on the Respiratory Panel, DOES NOT  test for the novel  Coronavirus (2019 nCoV)    Coronavirus HKU1 NOT DETECTED NOT DETECTED Final   Coronavirus NL63 NOT DETECTED NOT DETECTED Final   Coronavirus OC43 NOT DETECTED NOT DETECTED Final   Metapneumovirus NOT DETECTED NOT DETECTED Final   Rhinovirus / Enterovirus DETECTED (A) NOT DETECTED Final   Influenza A NOT DETECTED NOT DETECTED Final   Influenza B NOT DETECTED NOT DETECTED Final   Parainfluenza Virus 1 NOT DETECTED NOT DETECTED Final   Parainfluenza Virus 2 NOT DETECTED NOT DETECTED Final   Parainfluenza Virus 3 NOT DETECTED NOT DETECTED Final   Parainfluenza Virus 4 NOT DETECTED NOT DETECTED Final   Respiratory Syncytial Virus NOT DETECTED NOT DETECTED Final   Bordetella pertussis NOT DETECTED NOT DETECTED Final   Bordetella Parapertussis NOT DETECTED NOT DETECTED Final   Chlamydophila pneumoniae NOT DETECTED NOT DETECTED Final   Mycoplasma pneumoniae NOT DETECTED NOT DETECTED Final    Comment: Performed at [facility], [place]. [address], [place], [place] [address]  Expectorated Sputum Assessment w Gram Stain, Rflx to Resp Cult     Status: None   Collection Time: 05/03/22  9:45 PM   Specimen: Sputum  Result Value Ref Range Status   Specimen Description SPUTUM  Final   Special Requests NONE  Final   Sputum evaluation   Final    THIS SPECIMEN IS ACCEPTABLE FOR SPUTUM CULTURE Performed at [facility], [address] [place], [place] [address]    Report Status 05/04/2022 FINAL  Final  Culture, Respiratory w Gram Stain     Status: None   Collection Time: 05/03/22  9:45 PM   Specimen: SPU  Result Value Ref Range Status   Specimen Description   Final    SPUTUM Performed at [facility], [address] [place], [place] [address]    Special Requests   Final    NONE Reflexed from [id number] Performed at [facility], [address] [place], [place] [address]    Gram Stain   Final    RARE WBC PRESENT, PREDOMINANTLY PMN FEW BUDDING  YEAST SEEN    Culture   Final    FEW CANDIDA ALBICANS NO STAPHYLOCOCCUS AUREUS ISOLATED No Pseudomonas species isolated Performed at [facility], [place] [address], [place], [place] [address]    Report Status 05/07/2022 FINAL  Final    Procedures and diagnostic studies:  No results found.             LOS: 9 days   Damico Partin  [employer], advertising on [email or web address]. If 7PM-7AM, please contact night-coverage at [email or web address]     2022/05/24, 3:47 PM

## 2022-05-27 NOTE — Progress Notes (Signed)
[facility]) Hospital liaison note   [facility] continues to follow for discharge disposition and will assist in coordinating future care as appropriate once this is determined.    [facility] will continue to follow for any discharge planning needs and to coordinate continuation of hospice care.     Please call with any questions/concerns.    Thank you for the opportunity to participate in this patient's care.   Daphene Calamity, MSW [facility] Liaison  [phone]

## 2022-05-27 NOTE — Significant Event (Signed)
       CROSS COVER NOTE  NAME: Rollen Selders MRN: [id number] DOB : 10-07-1932 ATTENDING PHYSICIAN: Jennye Boroughs, MD    Notified by nursing staff that Mr Zapata has expired at 2320  Patient was DNR and comfort care at time of death.  2 RN verified.  Family notified by nursing staff.   This document was prepared using Dragon voice recognition software and may include unintentional dictation errors.  Neomia Glass DNP, MBA, FNP-BC Nurse Practitioner [facility] Pager [phone]

## 2022-05-27 NOTE — Care Management Important Message (Signed)
Important Message  Patient Details  Name: Dennis Serrano MRN: [id number] Date of Birth: [birth year]   Medicare Important Message Given:  Other (see comment)  Patient is on comfort care measures and to discharge with Hospice. Out of respect for the patient and family no Important Message from [facility] given.   Juliann Pulse A Temitope Griffing 05/13/2022, 11:03 AM

## 2022-05-27 NOTE — Progress Notes (Signed)
Progress Note    Jenson Beedle  UXL:[id number] DOB: [birth year]  DOA: 05/20/2022 PCP: Juline Patch, MD      Brief Narrative:    Medical records reviewed and are as summarized below:   Antonia Culbertson is a 88 y.o. male with medical history significant for CAD, Alzheimer's disease, hypertension, hyperlipidemia, Parkinson's, persistent A-fib, type 2 diabetes, COPD, who presented to the ED around 11 pm on 05/24/2022 for evaluation of worsening shortness of breath.    Patient was admitted for acute respiratory failure with hypoxia due to multifocal pneumonia.  He was started on IV antibiotics for working diagnosis of pneumonia, and IV heparin for possible ACS, with hs-troponin elevated (peaked above 11k) in the absence of chest pain.  Cardiology consulted.   11/5: on 12 L/min HFNC oxygen, otherwise stable, no CP  11/6: O2 needs improving, weaned 6 >> 4 L/min HFNC O2 this morning.  Delirium noted overnight.    11/7: remains on 5 L/min O2.  Stable for transfer to floor.   Of note, VQ scan was read as intermediate probability for pulmonary embolism.  He was transitioned from IV heparin to Eliquis.  Goals of care were discussed with [name], daughter, who requested comfort measures and discharge to hospice house.  Patient was transitioned to comfort care.     Assessment/Plan:   Principal Problem:   Acute hypoxemic respiratory failure (HCC) Active Problems:   Severe sepsis (HCC)   CAP (community acquired pneumonia)   CAD (coronary artery disease)   COPD with acute exacerbation (HCC)   NSTEMI (non-ST elevated myocardial infarction) ([place])   AKI (acute kidney injury) ([place])   Known medical problems   Goals of care, counseling/discussion   Delirium due to multiple etiologies, acute, hyperactive   Acute pulmonary embolism (HCC)   Hyponatremia   Acute on chronic diastolic CHF (congestive heart failure) ([place])    Body mass index is 22.63 kg/m.   Severe sepsis secondary to  community-acquired multifocal pneumonia, rhinovirus infection Acute on chronic diastolic CHF Probable acute pulmonary embolism Acute hypoxic respiratory failure Delirium with underlying dementia CAD with coronary stenting COPD with acute exacerbation AKI on CKD stage IIIa Hypertension Parkinson's disease Hyperlipidemia   PLAN  Continue comfort measures. He has been evaluated by the hospice team but he is deemed not to be a candidate for discharge to hospice house. Plan of care was discussed with [name], daughter.  She thinks that patient has declined since last hospice evaluation and she would like hospice to reevaluate patient for possible discharge to hospice house     Diet Order             DIET - DYS 1 Room service appropriate? Yes with Assist; Fluid consistency: Thin  Diet effective now                            Consultants: Cardiologist  Procedures: None    Medications:    Chlorhexidine Gluconate Cloth  6 each Topical Daily   sodium chloride flush  3 mL Intravenous Q12H   Continuous Infusions:  sodium chloride        Anti-infectives (From admission, onward)    Start     Dose/Rate Route Frequency Ordered Stop   05/05/22 1315  amoxicillin-clavulanate (AUGMENTIN) 500-125 MG per tablet 1 tablet        1 tablet Oral 2 times daily 05/05/22 1303 05/06/22 1054   05/03/22 2200  amoxicillin-clavulanate (AUGMENTIN) 500-125  MG per tablet 1 tablet        1 tablet Oral 2 times daily 05/03/22 1231 05/04/22 2220   05/02/22 2200  azithromycin (ZITHROMAX) tablet 500 mg  Status:  Discontinued        500 mg Oral Daily at bedtime 05/02/22 0936 05/03/22 1230   05/01/22 0015  cefTRIAXone (ROCEPHIN) 2 g in sodium chloride 0.9 % 100 mL IVPB  Status:  Discontinued        2 g 200 mL/hr over 30 Minutes Intravenous Every 24 hours 05/01/22 0003 05/03/22 1230   05/01/22 0015  azithromycin (ZITHROMAX) 500 mg in sodium chloride 0.9 % 250 mL IVPB  Status:  Discontinued         500 mg 250 mL/hr over 60 Minutes Intravenous Every 24 hours 05/01/22 0003 05/02/22 0935              Family Communication/Anticipated D/C date and plan/Code Status   DVT prophylaxis:      Code Status: DNR  Family Communication: Plan discussed with [name], daughter, over the phone.   Disposition Plan: Plan to discharge to hospice house/ home with hospice   Status is: Inpatient Remains inpatient appropriate because: Comfort measures        Subjective:   He is confused and cannot provide any history.  Interval events noted.  He is not eating much and he is sleeping more.  Objective:    Vitals:   05/09/22 0013 05/09/22 0751 05/09/22 2326 05/29/2022 0811  BP: (!) 190/90 (!) 163/86 (!) 177/83 (!) 191/81  Pulse: 85 86 90 89  Resp: '18  16 16  '$ Temp: (!) 97.4 F (36.3 C) 98 F (36.7 C) 99.3 F (37.4 C) 98 F (36.7 C)  TempSrc:      SpO2: 99% 97% 100% 95%  Weight:      Height:       No data found.   Intake/Output Summary (Last 24 hours) at 05/29/2022 1556 Last data filed at 05/29/2022 1508 Gross per 24 hour  Intake --  Output 1400 ml  Net -1400 ml    Filed Weights   05/01/22 1209 05/04/22 0500 05/06/22 0431  Weight: 68.9 kg 69.9 kg 69.5 kg    Exam:  GEN: NAD SKIN: Warm and dry EYES: EOMI ENT: MMM CV: RRR PULM: CTA B ABD: soft, ND, NT, +BS CNS: Drowsy but arousable, non focal EXT: No edema or tenderness       Data Reviewed:   I have personally reviewed following labs and imaging studies:  Labs: Labs show the following:   Basic Metabolic Panel: Recent Labs  Lab 05/03/22 1620 05/04/22 0538 05/05/22 0512  NA 131* 131* 136  K 5.0 4.6 4.5  CL 103 103 108  CO2 17* 19* 20*  GLUCOSE 253* 200* 186*  BUN 89* 93* 95*  CREATININE 2.88* 2.80* 2.60*  CALCIUM 8.7* 8.3* 8.5*  MG  --  2.1 2.2    GFR Estimated Creatinine Clearance: 19.7 mL/min (A) (by C-G formula based on SCr of 2.6 mg/dL (H)). Liver Function Tests: No results for  input(s): "AST", "ALT", "ALKPHOS", "BILITOT", "PROT", "ALBUMIN" in the last 168 hours.  No results for input(s): "LIPASE", "AMYLASE" in the last 168 hours. No results for input(s): "AMMONIA" in the last 168 hours. Coagulation profile No results for input(s): "INR", "PROTIME" in the last 168 hours.  CBC: Recent Labs  Lab 05/04/22 0538 05/05/22 0512  WBC 9.2 9.2  HGB 8.7* 8.9*  HCT 25.5* 26.5*  MCV 83.6  83.9  PLT 276 302    Cardiac Enzymes: No results for input(s): "CKTOTAL", "CKMB", "CKMBINDEX", "TROPONINI" in the last 168 hours. BNP (last 3 results) No results for input(s): "PROBNP" in the last 8760 hours. CBG: Recent Labs  Lab 05/06/22 1154 05/06/22 1543 05/06/22 2142 05/07/22 0746 05/07/22 1234  GLUCAP 197* 224* 196* 128* 206*    D-Dimer: No results for input(s): "DDIMER" in the last 72 hours. Hgb A1c: No results for input(s): "HGBA1C" in the last 72 hours. Lipid Profile: No results for input(s): "CHOL", "HDL", "LDLCALC", "TRIG", "CHOLHDL", "LDLDIRECT" in the last 72 hours.  Thyroid function studies: No results for input(s): "TSH", "T4TOTAL", "T3FREE", "THYROIDAB" in the last 72 hours.  Invalid input(s): "FREET3" Anemia work up: No results for input(s): "VITAMINB12", "FOLATE", "FERRITIN", "TIBC", "IRON", "RETICCTPCT" in the last 72 hours. Sepsis Labs: Recent Labs  Lab 05/03/22 1620 05/04/22 0538 05/05/22 0512  PROCALCITON 0.16  --   --   WBC  --  9.2 9.2     Microbiology Recent Results (from the past 240 hour(s))  Culture, blood (routine x 2)     Status: None   Collection Time: 05/13/2022 11:26 PM   Specimen: BLOOD  Result Value Ref Range Status   Specimen Description BLOOD BLOOD LEFT HAND  Final   Special Requests   Final    BOTTLES DRAWN AEROBIC AND ANAEROBIC Blood Culture adequate volume   Culture   Final    NO GROWTH 5 DAYS Performed at [facility], [address], [place], [place] [address]    Report Status 05/05/2022 FINAL  Final   Culture, blood (routine x 2)     Status: None   Collection Time: 05/16/2022 11:26 PM   Specimen: BLOOD  Result Value Ref Range Status   Specimen Description BLOOD BLOOD RIGHT ARM  Final   Special Requests   Final    BOTTLES DRAWN AEROBIC AND ANAEROBIC Blood Culture adequate volume   Culture   Final    NO GROWTH 5 DAYS Performed at [facility], [address], [place], [place] [address]    Report Status 05/05/2022 FINAL  Final  Resp Panel by RT-PCR (Flu A&B, Covid) Anterior Nasal Swab     Status: None   Collection Time: 05/20/2022 11:26 PM   Specimen: Anterior Nasal Swab  Result Value Ref Range Status   SARS Coronavirus 2 by RT PCR NEGATIVE NEGATIVE Final    Comment: (NOTE) SARS-CoV-2 target nucleic acids are NOT DETECTED.  The SARS-CoV-2 RNA is generally detectable in upper respiratory specimens during the acute phase of infection. The lowest concentration of SARS-CoV-2 viral copies this assay can detect is 138 copies/mL. A negative result does not preclude SARS-Cov-2 infection and should not be used as the sole basis for treatment or other patient management decisions. A negative result may occur with  improper specimen collection/handling, submission of specimen other than nasopharyngeal swab, presence of viral mutation(s) within the areas targeted by this assay, and inadequate number of viral copies(<138 copies/mL). A negative result must be combined with clinical observations, patient history, and epidemiological information. The expected result is Negative.  Fact Sheet for Patients:  EntrepreneurPulse.com.au  Fact Sheet for Healthcare Providers:  IncredibleEmployment.be  This test is no t yet approved or cleared by the [place] FDA and  has been authorized for detection and/or diagnosis of SARS-CoV-2 by FDA under an Emergency Use Authorization (EUA). This EUA will remain  in effect (meaning this test can be used) for the  duration  of the COVID-19 declaration under Section 564(b)(1) of the Act, 21 U.S.C.section 360bbb-3(b)(1), unless the authorization is terminated  or revoked sooner.       Influenza A by PCR NEGATIVE NEGATIVE Final   Influenza B by PCR NEGATIVE NEGATIVE Final    Comment: (NOTE) The Xpert Xpress SARS-CoV-2/FLU/RSV plus assay is intended as an aid in the diagnosis of influenza from Nasopharyngeal swab specimens and should not be used as a sole basis for treatment. Nasal washings and aspirates are unacceptable for Xpert Xpress SARS-CoV-2/FLU/RSV testing.  Fact Sheet for Patients: EntrepreneurPulse.com.au  Fact Sheet for Healthcare Providers: IncredibleEmployment.be  This test is not yet approved or cleared by the [place] FDA and has been authorized for detection and/or diagnosis of SARS-CoV-2 by FDA under an Emergency Use Authorization (EUA). This EUA will remain in effect (meaning this test can be used) for the duration of the COVID-19 declaration under Section 564(b)(1) of the Act, 21 U.S.C. section 360bbb-3(b)(1), unless the authorization is terminated or revoked.  Performed at [facility], [address] [place], [place] [address]   MRSA Next Gen by PCR, Nasal     Status: None   Collection Time: 05/01/22 12:07 PM   Specimen: Nasal Mucosa; Nasal Swab  Result Value Ref Range Status   MRSA by PCR Next Gen NOT DETECTED NOT DETECTED Final    Comment: (NOTE) The GeneXpert MRSA Assay (FDA approved for NASAL specimens only), is one component of a comprehensive MRSA colonization surveillance program. It is not intended to diagnose MRSA infection nor to guide or monitor treatment for MRSA infections. Test performance is not FDA approved in patients less than 26 years old. Performed at [facility], [place], [place] [address]   Respiratory (~20 pathogens) panel by PCR     Status: Abnormal   Collection  Time: 05/02/22  1:53 AM   Specimen: Nasopharyngeal Swab; Respiratory  Result Value Ref Range Status   Adenovirus NOT DETECTED NOT DETECTED Final   Coronavirus 229E NOT DETECTED NOT DETECTED Final    Comment: (NOTE) The Coronavirus on the Respiratory Panel, DOES NOT test for the novel  Coronavirus (2019 nCoV)    Coronavirus HKU1 NOT DETECTED NOT DETECTED Final   Coronavirus NL63 NOT DETECTED NOT DETECTED Final   Coronavirus OC43 NOT DETECTED NOT DETECTED Final   Metapneumovirus NOT DETECTED NOT DETECTED Final   Rhinovirus / Enterovirus DETECTED (A) NOT DETECTED Final   Influenza A NOT DETECTED NOT DETECTED Final   Influenza B NOT DETECTED NOT DETECTED Final   Parainfluenza Virus 1 NOT DETECTED NOT DETECTED Final   Parainfluenza Virus 2 NOT DETECTED NOT DETECTED Final   Parainfluenza Virus 3 NOT DETECTED NOT DETECTED Final   Parainfluenza Virus 4 NOT DETECTED NOT DETECTED Final   Respiratory Syncytial Virus NOT DETECTED NOT DETECTED Final   Bordetella pertussis NOT DETECTED NOT DETECTED Final   Bordetella Parapertussis NOT DETECTED NOT DETECTED Final   Chlamydophila pneumoniae NOT DETECTED NOT DETECTED Final   Mycoplasma pneumoniae NOT DETECTED NOT DETECTED Final    Comment: Performed at [facility], [place]. [address] [place],  [address]  Expectorated Sputum Assessment w Gram Stain, Rflx to Resp Cult     Status: None   Collection Time: 05/03/22  9:45 PM   Specimen: Sputum  Result Value Ref Range Status   Specimen Description SPUTUM  Final   Special Requests NONE  Final   Sputum evaluation   Final    THIS SPECIMEN IS  ACCEPTABLE FOR SPUTUM CULTURE Performed at [facility], [address] [place], [place] [address]    Report Status 05/04/2022 FINAL  Final  Culture, Respiratory w Gram Stain     Status: None   Collection Time: 05/03/22  9:45 PM   Specimen: SPU  Result Value Ref Range Status   Specimen Description   Final    SPUTUM Performed at [facility], [address] [place], [place] [address]    Special Requests   Final    NONE Reflexed from [id number] Performed at [facility], [address] [place], [place] [address]    Gram Stain   Final    RARE WBC PRESENT, PREDOMINANTLY PMN FEW BUDDING YEAST SEEN    Culture   Final    FEW CANDIDA ALBICANS NO STAPHYLOCOCCUS AUREUS ISOLATED No Pseudomonas species isolated Performed at [facility], [place] [address] [place], [place] [address]    Report Status 05/07/2022 FINAL  Final    Procedures and diagnostic studies:  No results found.             LOS: 9 days   Tennessee Hanlon  [employer], advertising on [email or web address]. If 7PM-7AM, please contact night-coverage at [email or web address]     06-09-22, 3:56 PM

## 2022-05-27 NOTE — Death Summary Note (Addendum)
DEATH SUMMARY   Patient Details  Name: Dennis Serrano MRN: [id number] DOB: 20-Oct-1932 NLZ:[id number], Marijo Conception, MD Admission/Discharge Information   Admit Date:  05/01/22  Date of Death: Date of Death: 04/29/2022  Time of Death: Time of Death: 09-08-2318  Length of Stay: September 05, 2022   Principle Cause of death: Severe sepsis  Hospital Diagnoses: Principal Problem:   Severe sepsis ([place]) Active Problems:   CAP (community acquired pneumonia)   CAD (coronary artery disease)   Acute hypoxemic respiratory failure (HCC)   COPD with acute exacerbation ([place])   AKI (acute kidney injury) ([place])   Known medical problems   Goals of care, counseling/discussion   Delirium due to multiple etiologies, acute, hyperactive   Acute pulmonary embolism (HCC)   Hyponatremia   Acute on chronic diastolic CHF (congestive heart failure) [facility])   Hospital Course:  Mr. Dennis Serrano is a 88 y.o. male with medical history significant for CAD, Alzheimer's disease, hypertension, hyperlipidemia, Parkinson's, persistent A-fib, type 2 diabetes, COPD, who presented to the ED around 11 pm on May 01, 2022 for evaluation of worsening shortness of breath.    Patient was admitted for acute respiratory failure with hypoxia due to multifocal pneumonia.  He was started on IV antibiotics for working diagnosis of pneumonia, and IV heparin for possible ACS, with hs-troponin elevated (peaked above 11k) in the absence of chest pain.  Acute coronary syndrome was ruled out and elevated troponin was attributed to demand ischemia.  He also had acute hypoxemic respiratory failure, and he was requiring up to 12 L/min oxygen via high flow nasal cannula.  VQ scan was concerning for intermediate probability for pulmonary embolism.  He was treated with IV heparin drip and was subsequently transitioned to Eliquis.   [name], daughter, requested comfort measures and hospice care because of poor quality of life and multiple comorbidities.  His condition  deteriorated and he expired on 05/17/2022 at 11:20 pm           Procedures: None  Consultations: Cardiologist, palliative care  The results of significant diagnostics from this hospitalization (including imaging, microbiology, ancillary and laboratory) are listed below for reference.   Significant Diagnostic Studies: US Venous Img Lower Bilateral (DVT)  Result Date: 05/04/2022 CLINICAL DATA:  History of pulmonary embolism, on anticoagulation. Evaluate for DVT. EXAM: BILATERAL LOWER EXTREMITY VENOUS DOPPLER ULTRASOUND TECHNIQUE: Gray-scale sonography with graded compression, as well as color Doppler and duplex ultrasound were performed to evaluate the lower extremity deep venous systems from the level of the common femoral vein and including the common femoral, femoral, profunda femoral, popliteal and calf veins including the posterior tibial, peroneal and gastrocnemius veins when visible. The superficial great saphenous vein was also interrogated. Spectral Doppler was utilized to evaluate flow at rest and with distal augmentation maneuvers in the common femoral, femoral and popliteal veins. COMPARISON:  Nuclear medicine perfusion lung scan-05/04/2022. FINDINGS: RIGHT LOWER EXTREMITY Common Femoral Vein: No evidence of thrombus. Normal compressibility, respiratory phasicity and response to augmentation. Saphenofemoral Junction: No evidence of thrombus. Normal compressibility and flow on color Doppler imaging. Profunda Femoral Vein: No evidence of thrombus. Normal compressibility and flow on color Doppler imaging. Femoral Vein: No evidence of thrombus. Normal compressibility, respiratory phasicity and response to augmentation. Popliteal Vein: No evidence of thrombus. Normal compressibility, respiratory phasicity and response to augmentation. Calf Veins: No evidence of thrombus. Normal compressibility and flow on color Doppler imaging. Superficial Great Saphenous Vein: No evidence of thrombus. Normal  compressibility. Other Findings:  None. LEFT LOWER EXTREMITY Common Femoral  Vein: No evidence of thrombus. Normal compressibility, respiratory phasicity and response to augmentation. Saphenofemoral Junction: No evidence of thrombus. Normal compressibility and flow on color Doppler imaging. Profunda Femoral Vein: No evidence of thrombus. Normal compressibility and flow on color Doppler imaging. Femoral Vein: No evidence of thrombus. Normal compressibility, respiratory phasicity and response to augmentation. Popliteal Vein: No evidence of thrombus. Normal compressibility, respiratory phasicity and response to augmentation. Calf Veins: No evidence of thrombus. Normal compressibility and flow on color Doppler imaging. Superficial Great Saphenous Vein: No evidence of thrombus. Normal compressibility. Other Findings:  None. IMPRESSION: No evidence of DVT within either lower extremity. Electronically Signed   By: Sandi Mariscal M.D.   On: 05/04/2022 08:11   NM Pulmonary Perfusion  Result Date: 05/02/2022 CLINICAL DATA:  Short of breath. EXAM: NUCLEAR MEDICINE PERFUSION LUNG SCAN TECHNIQUE: Perfusion images were obtained in multiple projections after intravenous injection of radiopharmaceutical. RADIOPHARMACEUTICALS:  4.26 mCi Tc-33mMAA COMPARISON:  Chest radiograph 05/18/2022 FINDINGS: Exam is limited by patient's arms at side. Patient was unable to elevate arms for exam. Decreased region perfusion to the RIGHT lung apex on multiple projections. Perfusion defect seen on the RPO projection is favored related to arm attenuation. No wedge-shaped peripheral perfusion defect.s Chest radiograph demonstrates bibasilar airspace opacity and RIGHT effusion. RIGHT upper lobe relatively clear. IMPRESSION: Concern for RIGHT upper lobe pulmonary embolism. Intermediate probability. Limited exam due to arms at side. Electronically Signed   By: SSuzy BouchardM.D.   On: 05/02/2022 16:24   DG Chest Port 1 View  Result Date:  05/02/2022 CLINICAL DATA:  Acute respiratory failure, hypoxia EXAM: PORTABLE CHEST 1 VIEW COMPARISON:  Previous studies including the examination done on 05/01/2022 FINDINGS: Transverse diameter of heart is increased. There is interval worsening of pulmonary vascular congestion. Increased interstitial and alveolar markings are seen in parahilar regions and lower lung fields. Small to moderate bilateral pleural effusions are seen with interval increase. Evaluation of lower lung fields for focal infiltrates is limited by the bilateral effusions. There is no pneumothorax. IMPRESSION: There is interval worsening of pulmonary vascular congestion suggesting worsening CHF. There is interval increase in opacification of both parahilar regions and lower lung fields suggesting increase in bilateral pleural effusions and worsening pulmonary edema. Electronically Signed   By: PElmer PickerM.D.   On: 05/02/2022 15:35   ECHOCARDIOGRAM COMPLETE  Result Date: 05/02/2022    ECHOCARDIOGRAM REPORT   Patient Name:   JZAINE ELSASSDate of Exam: 05/01/2022 Medical Rec #:  [id number]    Height:       69.0 in Accession #:    [id number]   Weight:       151.9 lb Date of Birth:  5November 16, 1934    BSA:          1.838 m Patient Age:    89years      BP:           136/72 mmHg Patient Gender: M             HR:           75 bpm. Exam Location:  [facility] Procedure: 2D Echo Indications:     Elevated Troponin  History:         Patient has prior history of Echocardiogram examinations, most                  recent 10/04/2016.  Sonographer:     TKathlen BrunswickRDCS Referring Phys:  [id number]  ECKSTAT Diagnosing Phys: Yolonda Kida MD  Sonographer Comments: Suboptimal parasternal window. Image acquisition challenging due to patient body habitus. IMPRESSIONS  1. Left ventricular ejection fraction, by estimation, is 50 to 55%. The left ventricle has low normal function. The left ventricle has no regional wall motion abnormalities. There is  mild concentric left ventricular hypertrophy. Left ventricular diastolic parameters are consistent with Grade I diastolic dysfunction (impaired relaxation).  2. Right ventricular systolic function is normal. The right ventricular size is normal.  3. Left atrial size was mildly dilated.  4. Right atrial size was mildly dilated.  5. The mitral valve is normal in structure. Moderate mitral valve regurgitation.  6. Tricuspid valve regurgitation is mild to moderate.  7. The aortic valve is normal in structure. Aortic valve regurgitation is trivial. FINDINGS  Left Ventricle: Left ventricular ejection fraction, by estimation, is 50 to 55%. The left ventricle has low normal function. The left ventricle has no regional wall motion abnormalities. The left ventricular internal cavity size was normal in size. There is mild concentric left ventricular hypertrophy. Left ventricular diastolic parameters are consistent with Grade I diastolic dysfunction (impaired relaxation). Right Ventricle: The right ventricular size is normal. No increase in right ventricular wall thickness. Right ventricular systolic function is normal. Left Atrium: Left atrial size was mildly dilated. Right Atrium: Right atrial size was mildly dilated. Pericardium: There is no evidence of pericardial effusion. Mitral Valve: The mitral valve is normal in structure. Moderate mitral valve regurgitation. Tricuspid Valve: The tricuspid valve is normal in structure. Tricuspid valve regurgitation is mild to moderate. Aortic Valve: The aortic valve is normal in structure. Aortic valve regurgitation is trivial. Aortic regurgitation PHT measures 637 msec. Aortic valve peak gradient measures 5.2 mmHg. Pulmonic Valve: The pulmonic valve was normal in structure. Pulmonic valve regurgitation is not visualized. Aorta: The ascending aorta was not well visualized. IAS/Shunts: No atrial level shunt detected by color flow Doppler.  LEFT VENTRICLE PLAX 2D LVIDd:         4.90 cm      Diastology LVIDs:         3.60 cm     LV e' medial:    4.19 cm/s LV PW:         1.40 cm     LV E/e' medial:  19.4 LV IVS:        1.20 cm     LV e' lateral:   7.56 cm/s LVOT diam:     2.00 cm     LV E/e' lateral: 10.7 LV SV:         38 LV SV Index:   21 LVOT Area:     3.14 cm  LV Volumes (MOD) LV vol d, MOD A2C: 90.0 ml LV vol d, MOD A4C: 53.0 ml LV vol s, MOD A2C: 52.0 ml LV vol s, MOD A4C: 34.5 ml LV SV MOD A2C:     38.0 ml LV SV MOD A4C:     53.0 ml LV SV MOD BP:      27.1 ml RIGHT VENTRICLE RV Basal diam:  3.70 cm RV S prime:     7.56 cm/s TAPSE (M-mode): 1.4 cm LEFT ATRIUM             Index        RIGHT ATRIUM           Index LA diam:        4.40 cm 2.39 cm/m   RA Area:  14.20 cm LA Vol (A2C):   51.7 ml 28.13 ml/m  RA Volume:   35.60 ml  19.37 ml/m LA Vol (A4C):   40.0 ml 21.76 ml/m LA Biplane Vol: 47.7 ml 25.95 ml/m  AORTIC VALVE                 PULMONIC VALVE AV Area (Vmax): 1.76 cm     PV Vmax:       0.97 m/s AV Vmax:        114.00 cm/s  PV Peak grad:  3.8 mmHg AV Peak Grad:   5.2 mmHg LVOT Vmax:      63.95 cm/s LVOT Vmean:     40.350 cm/s LVOT VTI:       0.120 m AI PHT:         637 msec  AORTA Ao Root diam: 2.60 cm MITRAL VALVE                  TRICUSPID VALVE MV Area (PHT): 3.89 cm       TV Peak grad:   36.8 mmHg MV Decel Time: 195 msec       TV Vmax:        3.04 m/s MR Peak grad:    110.2 mmHg   TR Peak grad:   41.7 mmHg MR Mean grad:    66.0 mmHg    TR Vmax:        323.00 cm/s MR Vmax:         525.00 cm/s MR Vmean:        373.0 cm/s   SHUNTS MR PISA:         1.57 cm     Systemic VTI:  0.12 m MR PISA Eff ROA: 9 mm        Systemic Diam: 2.00 cm MR PISA Radius:  0.50 cm MV E velocity: 81.20 cm/s MV A velocity: 97.50 cm/s MV E/A ratio:  0.83 Dwayne D Callwood MD Electronically signed by Yolonda Kida MD Signature Date/Time: 05/02/2022/3:02:48 PM    Final    DG Chest Port 1 View  Result Date: 04/29/2022 CLINICAL DATA:  Respiratory distress EXAM: PORTABLE CHEST 1 VIEW COMPARISON:   Radiograph 08/24/2017 FINDINGS: Hazy airspace and interstitial opacities greatest in the right mid and lower lung. Possible small right pleural effusion. No pneumothorax. Stable cardiomediastinal silhouette. Aortic atherosclerotic calcification. No acute osseous abnormality. IMPRESSION: Nonspecific hazy opacities greatest in the right mid and lower lung can be seen with infection or edema. Electronically Signed   By: Placido Sou M.D.   On: 05/24/2022 23:38    Microbiology: Recent Results (from the past 240 hour(s))  Expectorated Sputum Assessment w Gram Stain, Rflx to Resp Cult     Status: None   Collection Time: 05/03/22  9:45 PM   Specimen: Sputum  Result Value Ref Range Status   Specimen Description SPUTUM  Final   Special Requests NONE  Final   Sputum evaluation   Final    THIS SPECIMEN IS ACCEPTABLE FOR SPUTUM CULTURE Performed at [facility], [address] [place], [place] [address]    Report Status 05/04/2022 FINAL  Final  Culture, Respiratory w Gram Stain     Status: None   Collection Time: 05/03/22  9:45 PM   Specimen: SPU  Result Value Ref Range Status   Specimen Description   Final    SPUTUM Performed at [facility], [address], [place], [place] [address]    Special Requests  Final    NONE Reflexed from [id number] Performed at [facility], [address] [place], [place] [address]    Gram Stain   Final    RARE WBC PRESENT, PREDOMINANTLY PMN FEW BUDDING YEAST SEEN    Culture   Final    FEW CANDIDA ALBICANS NO STAPHYLOCOCCUS AUREUS ISOLATED No Pseudomonas species isolated Performed at [facility], [place] [address] [place], [place] [address]    Report Status 05/07/2022 FINAL  Final    Time spent: 35 minutes  Signed: Jennye Boroughs, MD 05/08/2022

## 2022-05-27 DEATH — deceased
